# Patient Record
Sex: Female | Born: 1980 | Race: Black or African American | Hispanic: No | Marital: Single | Smoking: Current every day smoker
Health system: Southern US, Community
[De-identification: ages and names within clinical notes are randomized; demographics above are authoritative.]

## PROBLEM LIST (undated history)

## (undated) DIAGNOSIS — N83209 Unspecified ovarian cyst, unspecified side: Secondary | ICD-10-CM

## (undated) DIAGNOSIS — J302 Other seasonal allergic rhinitis: Secondary | ICD-10-CM

## (undated) DIAGNOSIS — Z21 Asymptomatic human immunodeficiency virus [HIV] infection status: Secondary | ICD-10-CM

## (undated) DIAGNOSIS — L0293 Carbuncle, unspecified: Secondary | ICD-10-CM

## (undated) DIAGNOSIS — L0292 Furuncle, unspecified: Secondary | ICD-10-CM

## (undated) DIAGNOSIS — R569 Unspecified convulsions: Secondary | ICD-10-CM

## (undated) DIAGNOSIS — R350 Frequency of micturition: Secondary | ICD-10-CM

## (undated) DIAGNOSIS — N1330 Unspecified hydronephrosis: Secondary | ICD-10-CM

## (undated) DIAGNOSIS — N12 Tubulo-interstitial nephritis, not specified as acute or chronic: Secondary | ICD-10-CM

## (undated) DIAGNOSIS — B20 Human immunodeficiency virus [HIV] disease: Secondary | ICD-10-CM

## (undated) DIAGNOSIS — A599 Trichomoniasis, unspecified: Secondary | ICD-10-CM

## (undated) HISTORY — DX: Other seasonal allergic rhinitis: J30.2

## (undated) HISTORY — DX: Unspecified convulsions: R56.9

---
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
## 2003-01-19 ENCOUNTER — Encounter: Admission: RE | Admit: 2003-01-19 | Discharge: 2003-01-19 | Payer: Self-pay | Admitting: Internal Medicine

## 2003-03-02 ENCOUNTER — Ambulatory Visit (HOSPITAL_COMMUNITY): Admission: RE | Admit: 2003-03-02 | Discharge: 2003-03-02 | Payer: Self-pay | Admitting: Internal Medicine

## 2003-03-02 ENCOUNTER — Encounter: Admission: RE | Admit: 2003-03-02 | Discharge: 2003-03-02 | Payer: Self-pay | Admitting: Internal Medicine

## 2003-03-30 ENCOUNTER — Encounter: Admission: RE | Admit: 2003-03-30 | Discharge: 2003-03-30 | Payer: Self-pay | Admitting: Internal Medicine

## 2003-06-16 ENCOUNTER — Ambulatory Visit (HOSPITAL_COMMUNITY): Admission: RE | Admit: 2003-06-16 | Discharge: 2003-06-16 | Payer: Self-pay | Admitting: Internal Medicine

## 2003-06-16 ENCOUNTER — Encounter: Admission: RE | Admit: 2003-06-16 | Discharge: 2003-06-16 | Payer: Self-pay | Admitting: Internal Medicine

## 2003-07-21 ENCOUNTER — Encounter: Admission: RE | Admit: 2003-07-21 | Discharge: 2003-07-21 | Payer: Self-pay | Admitting: Internal Medicine

## 2003-09-28 ENCOUNTER — Ambulatory Visit: Payer: Self-pay | Admitting: Internal Medicine

## 2003-09-28 ENCOUNTER — Ambulatory Visit (HOSPITAL_COMMUNITY): Admission: RE | Admit: 2003-09-28 | Discharge: 2003-09-28 | Payer: Self-pay | Admitting: Internal Medicine

## 2004-01-25 ENCOUNTER — Ambulatory Visit: Payer: Self-pay | Admitting: Internal Medicine

## 2004-01-25 ENCOUNTER — Ambulatory Visit (HOSPITAL_COMMUNITY): Admission: RE | Admit: 2004-01-25 | Discharge: 2004-01-25 | Payer: Self-pay | Admitting: Internal Medicine

## 2004-02-08 ENCOUNTER — Ambulatory Visit: Payer: Self-pay | Admitting: *Deleted

## 2004-02-24 ENCOUNTER — Ambulatory Visit: Payer: Self-pay | Admitting: Family Medicine

## 2004-03-02 ENCOUNTER — Ambulatory Visit (HOSPITAL_COMMUNITY): Admission: RE | Admit: 2004-03-02 | Discharge: 2004-03-02 | Payer: Self-pay | Admitting: *Deleted

## 2004-03-16 ENCOUNTER — Ambulatory Visit: Payer: Self-pay | Admitting: Family Medicine

## 2004-03-30 ENCOUNTER — Ambulatory Visit: Payer: Self-pay | Admitting: Family Medicine

## 2004-04-06 ENCOUNTER — Ambulatory Visit (HOSPITAL_COMMUNITY): Admission: RE | Admit: 2004-04-06 | Discharge: 2004-04-06 | Payer: Self-pay | Admitting: *Deleted

## 2004-04-13 ENCOUNTER — Ambulatory Visit: Payer: Self-pay | Admitting: Family Medicine

## 2004-04-18 ENCOUNTER — Ambulatory Visit: Payer: Self-pay | Admitting: Internal Medicine

## 2004-04-18 ENCOUNTER — Ambulatory Visit (HOSPITAL_COMMUNITY): Admission: RE | Admit: 2004-04-18 | Discharge: 2004-04-18 | Payer: Self-pay | Admitting: Internal Medicine

## 2004-04-18 ENCOUNTER — Encounter (INDEPENDENT_AMBULATORY_CARE_PROVIDER_SITE_OTHER): Payer: Self-pay | Admitting: *Deleted

## 2004-04-18 LAB — CONVERTED CEMR LAB: CD4 Count: 250 microliters

## 2004-05-04 ENCOUNTER — Ambulatory Visit: Payer: Self-pay | Admitting: Family Medicine

## 2004-05-25 ENCOUNTER — Ambulatory Visit: Payer: Self-pay | Admitting: *Deleted

## 2004-05-30 ENCOUNTER — Ambulatory Visit: Payer: Self-pay | Admitting: Internal Medicine

## 2004-06-08 ENCOUNTER — Ambulatory Visit: Payer: Self-pay | Admitting: Family Medicine

## 2004-06-22 ENCOUNTER — Ambulatory Visit: Payer: Self-pay | Admitting: Family Medicine

## 2004-07-06 ENCOUNTER — Ambulatory Visit: Payer: Self-pay | Admitting: Family Medicine

## 2004-08-01 ENCOUNTER — Ambulatory Visit: Payer: Self-pay | Admitting: Internal Medicine

## 2004-08-01 ENCOUNTER — Encounter (INDEPENDENT_AMBULATORY_CARE_PROVIDER_SITE_OTHER): Payer: Self-pay | Admitting: *Deleted

## 2004-08-01 ENCOUNTER — Ambulatory Visit (HOSPITAL_COMMUNITY): Admission: RE | Admit: 2004-08-01 | Discharge: 2004-08-01 | Payer: Self-pay | Admitting: Internal Medicine

## 2004-08-03 ENCOUNTER — Ambulatory Visit: Payer: Self-pay | Admitting: Family Medicine

## 2004-08-10 ENCOUNTER — Ambulatory Visit: Payer: Self-pay | Admitting: *Deleted

## 2004-08-15 ENCOUNTER — Encounter (INDEPENDENT_AMBULATORY_CARE_PROVIDER_SITE_OTHER): Payer: Self-pay | Admitting: *Deleted

## 2004-08-15 ENCOUNTER — Ambulatory Visit: Payer: Self-pay | Admitting: Internal Medicine

## 2004-08-15 LAB — CONVERTED CEMR LAB: HIV 1 RNA Quant: 5750 copies/mL

## 2004-08-21 ENCOUNTER — Ambulatory Visit: Payer: Self-pay | Admitting: Internal Medicine

## 2004-08-21 ENCOUNTER — Ambulatory Visit: Payer: Self-pay | Admitting: Family Medicine

## 2004-08-21 ENCOUNTER — Inpatient Hospital Stay (HOSPITAL_COMMUNITY): Admission: AD | Admit: 2004-08-21 | Discharge: 2004-09-05 | Payer: Self-pay | Admitting: Obstetrics and Gynecology

## 2004-09-02 ENCOUNTER — Encounter (INDEPENDENT_AMBULATORY_CARE_PROVIDER_SITE_OTHER): Payer: Self-pay | Admitting: Specialist

## 2004-09-19 ENCOUNTER — Ambulatory Visit: Payer: Self-pay | Admitting: Internal Medicine

## 2006-01-30 DIAGNOSIS — G3184 Mild cognitive impairment, so stated: Secondary | ICD-10-CM | POA: Insufficient documentation

## 2006-01-30 DIAGNOSIS — F17200 Nicotine dependence, unspecified, uncomplicated: Secondary | ICD-10-CM

## 2006-01-30 DIAGNOSIS — B20 Human immunodeficiency virus [HIV] disease: Secondary | ICD-10-CM | POA: Insufficient documentation

## 2006-01-30 DIAGNOSIS — J309 Allergic rhinitis, unspecified: Secondary | ICD-10-CM | POA: Insufficient documentation

## 2006-01-30 DIAGNOSIS — F329 Major depressive disorder, single episode, unspecified: Secondary | ICD-10-CM | POA: Insufficient documentation

## 2006-01-30 DIAGNOSIS — N3944 Nocturnal enuresis: Secondary | ICD-10-CM | POA: Insufficient documentation

## 2006-01-30 DIAGNOSIS — F411 Generalized anxiety disorder: Secondary | ICD-10-CM | POA: Insufficient documentation

## 2006-02-25 ENCOUNTER — Encounter (INDEPENDENT_AMBULATORY_CARE_PROVIDER_SITE_OTHER): Payer: Self-pay | Admitting: *Deleted

## 2006-02-25 LAB — CONVERTED CEMR LAB

## 2006-03-10 ENCOUNTER — Encounter (INDEPENDENT_AMBULATORY_CARE_PROVIDER_SITE_OTHER): Payer: Self-pay | Admitting: *Deleted

## 2006-04-03 ENCOUNTER — Encounter: Payer: Self-pay | Admitting: Internal Medicine

## 2006-04-23 ENCOUNTER — Encounter: Admission: RE | Admit: 2006-04-23 | Discharge: 2006-04-23 | Payer: Self-pay | Admitting: Internal Medicine

## 2006-04-23 ENCOUNTER — Ambulatory Visit: Payer: Self-pay | Admitting: Internal Medicine

## 2006-04-23 LAB — CONVERTED CEMR LAB
ALT: 20 units/L (ref 0–35)
AST: 31 units/L (ref 0–37)
BUN: 11 mg/dL (ref 6–23)
Basophils Absolute: 0 10*3/uL (ref 0.0–0.1)
Basophils Relative: 1 % (ref 0–1)
CO2: 25 meq/L (ref 19–32)
Chloride: 107 meq/L (ref 96–112)
Creatinine, Ser: 0.52 mg/dL (ref 0.40–1.20)
Eosinophils Absolute: 0 10*3/uL (ref 0.0–0.7)
Eosinophils Relative: 2 % (ref 0–5)
Glucose, Bld: 82 mg/dL (ref 70–99)
Hemoglobin: 11.9 g/dL — ABNORMAL LOW (ref 12.0–15.0)
Lymphs Abs: 0.9 10*3/uL (ref 0.7–3.3)
MCHC: 33.5 g/dL (ref 30.0–36.0)
Neutrophils Relative %: 48 % (ref 43–77)
RBC: 3.83 M/uL — ABNORMAL LOW (ref 3.87–5.11)
RDW: 12.7 % (ref 11.5–14.0)
Sodium: 138 meq/L (ref 135–145)
Total Bilirubin: 0.2 mg/dL — ABNORMAL LOW (ref 0.3–1.2)
WBC: 2.4 10*3/uL — ABNORMAL LOW (ref 4.0–10.5)

## 2006-04-29 ENCOUNTER — Telehealth: Payer: Self-pay | Admitting: Internal Medicine

## 2006-05-07 ENCOUNTER — Telehealth: Payer: Self-pay | Admitting: Internal Medicine

## 2006-05-15 ENCOUNTER — Telehealth: Payer: Self-pay | Admitting: Internal Medicine

## 2006-06-11 ENCOUNTER — Ambulatory Visit: Payer: Self-pay | Admitting: Internal Medicine

## 2006-06-11 DIAGNOSIS — N3000 Acute cystitis without hematuria: Secondary | ICD-10-CM

## 2008-01-07 ENCOUNTER — Encounter: Payer: Self-pay | Admitting: Internal Medicine

## 2008-01-12 ENCOUNTER — Encounter: Payer: Self-pay | Admitting: Internal Medicine

## 2008-01-16 ENCOUNTER — Ambulatory Visit: Payer: Self-pay | Admitting: Internal Medicine

## 2008-01-16 ENCOUNTER — Encounter: Payer: Self-pay | Admitting: Internal Medicine

## 2008-01-16 LAB — CONVERTED CEMR LAB
AST: 28 units/L (ref 0–37)
Basophils Absolute: 0 10*3/uL (ref 0.0–0.1)
Basophils Relative: 0 % (ref 0–1)
Chloride: 103 meq/L (ref 96–112)
Creatinine, Ser: 0.44 mg/dL (ref 0.40–1.20)
Eosinophils Absolute: 0 10*3/uL (ref 0.0–0.7)
Eosinophils Relative: 0 % (ref 0–5)
HIV 1 RNA Quant: 16100 copies/mL — ABNORMAL HIGH (ref <48)
Lymphocytes Relative: 31 % (ref 12–46)
Lymphs Abs: 0.9 10*3/uL (ref 0.7–4.0)
Monocytes Absolute: 0.2 10*3/uL (ref 0.1–1.0)
Monocytes Relative: 7 % (ref 3–12)
Neutro Abs: 1.7 10*3/uL (ref 1.7–7.7)
Neutrophils Relative %: 61 % (ref 43–77)
Platelets: 193 10*3/uL (ref 150–400)
RBC: 3.91 M/uL (ref 3.87–5.11)
RDW: 14.1 % (ref 11.5–15.5)
WBC: 2.8 10*3/uL — ABNORMAL LOW (ref 4.0–10.5)

## 2008-01-21 ENCOUNTER — Encounter: Payer: Self-pay | Admitting: Internal Medicine

## 2008-01-28 ENCOUNTER — Ambulatory Visit: Payer: Self-pay | Admitting: Internal Medicine

## 2008-01-28 DIAGNOSIS — R350 Frequency of micturition: Secondary | ICD-10-CM

## 2008-01-28 DIAGNOSIS — R1013 Epigastric pain: Secondary | ICD-10-CM

## 2008-01-28 LAB — CONVERTED CEMR LAB
Bilirubin Urine: NEGATIVE
Glucose, Urine, Semiquant: NEGATIVE
Nitrite: POSITIVE
Protein, U semiquant: >=300
Specific Gravity, Urine: 1.02

## 2008-01-29 ENCOUNTER — Encounter: Payer: Self-pay | Admitting: Internal Medicine

## 2008-02-12 ENCOUNTER — Ambulatory Visit: Payer: Self-pay | Admitting: Internal Medicine

## 2008-02-12 DIAGNOSIS — L0293 Carbuncle, unspecified: Secondary | ICD-10-CM

## 2008-02-12 DIAGNOSIS — L0292 Furuncle, unspecified: Secondary | ICD-10-CM | POA: Insufficient documentation

## 2008-03-09 ENCOUNTER — Ambulatory Visit: Payer: Self-pay | Admitting: Internal Medicine

## 2008-11-18 ENCOUNTER — Telehealth (INDEPENDENT_AMBULATORY_CARE_PROVIDER_SITE_OTHER): Payer: Self-pay | Admitting: *Deleted

## 2009-01-03 ENCOUNTER — Ambulatory Visit: Payer: Self-pay | Admitting: Internal Medicine

## 2009-01-03 LAB — CONVERTED CEMR LAB
Albumin: 3.7 g/dL (ref 3.5–5.2)
Alkaline Phosphatase: 66 units/L (ref 39–117)
BUN: 8 mg/dL (ref 6–23)
CO2: 22 meq/L (ref 19–32)
Eosinophils Relative: 3 % (ref 0–5)
Glucose, Bld: 91 mg/dL (ref 70–99)
HCT: 37.6 % (ref 36.0–46.0)
HDL: 39 mg/dL — ABNORMAL LOW (ref >39)
HIV 1 RNA Quant: <48 copies/mL (ref <48)
Lymphocytes Relative: 30 % (ref 12–46)
Lymphs Abs: 0.9 10*3/uL (ref 0.7–4.0)
MCV: 93.3 fL (ref >=78.0)
Monocytes Absolute: 0.2 10*3/uL (ref 0.1–1.0)
Neutro Abs: 1.7 10*3/uL (ref 1.7–7.7)
Platelets: 224 10*3/uL (ref 150–400)
RBC: 4.03 M/uL (ref 3.87–5.11)
Sodium: 138 meq/L (ref 135–145)
Total Bilirubin: 0.3 mg/dL (ref 0.3–1.2)
VLDL: 19 mg/dL (ref 0–40)

## 2009-01-14 ENCOUNTER — Telehealth (INDEPENDENT_AMBULATORY_CARE_PROVIDER_SITE_OTHER): Payer: Self-pay | Admitting: *Deleted

## 2009-01-26 ENCOUNTER — Ambulatory Visit: Payer: Self-pay | Admitting: Internal Medicine

## 2009-02-11 ENCOUNTER — Telehealth (INDEPENDENT_AMBULATORY_CARE_PROVIDER_SITE_OTHER): Payer: Self-pay | Admitting: *Deleted

## 2009-04-29 ENCOUNTER — Encounter (INDEPENDENT_AMBULATORY_CARE_PROVIDER_SITE_OTHER): Payer: Self-pay | Admitting: *Deleted

## 2009-07-06 ENCOUNTER — Encounter: Payer: Self-pay | Admitting: Internal Medicine

## 2009-07-08 ENCOUNTER — Ambulatory Visit: Payer: Self-pay | Admitting: Internal Medicine

## 2009-07-08 LAB — CONVERTED CEMR LAB: HIV-1 RNA Quant, Log: 3.14 — ABNORMAL HIGH (ref <1.68)

## 2009-08-23 ENCOUNTER — Encounter: Payer: Self-pay | Admitting: Internal Medicine

## 2009-08-25 ENCOUNTER — Telehealth (INDEPENDENT_AMBULATORY_CARE_PROVIDER_SITE_OTHER): Payer: Self-pay | Admitting: *Deleted

## 2009-11-14 ENCOUNTER — Encounter: Payer: Self-pay | Admitting: Internal Medicine

## 2009-11-15 ENCOUNTER — Encounter (INDEPENDENT_AMBULATORY_CARE_PROVIDER_SITE_OTHER): Payer: Self-pay | Admitting: *Deleted

## 2010-02-02 NOTE — Miscellaneous (Signed)
Summary: Orders Update - [facility] referral   Clinical Lists Changes  Problems: Reactivated problem of SCREENING FOR MALIGNANT NEOPLASM OF THE CERVIX (ICD-V76.2) Orders: Added new Referral order of Misc. Referral (Misc. Ref) - Signed

## 2010-02-02 NOTE — Consult Note (Signed)
Summary: [facility]: ED  [facility]: ED   Imported By: Florinda Marker 11/30/2009 10:26:44  _____________________________________________________________________  External Attachment:    Type:   Image     Comment:   External Document

## 2010-02-02 NOTE — Progress Notes (Signed)
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 11/16/08-[facility] scheduled ID medical appt for client 11/19/08-[facility] Counselor visited with client @ her home (home visit) 01/26/09-[facility] Counselor transported client to ID medical appt Ferd Glassing  February 11, 2009 2:16 PM

## 2010-02-02 NOTE — Progress Notes (Signed)
Summary: [facility] contact  Phone Note Call from Patient   Summary of Call: patient connected with [facility] services 12/10.

## 2010-02-02 NOTE — Miscellaneous (Signed)
Summary: [employer].  [place]  Recruitment consultant.   Imported By: Florinda Marker 08/25/2009 15:21:39  _____________________________________________________________________  External Attachment:    Type:   Image     Comment:   External Document

## 2010-02-02 NOTE — Miscellaneous (Signed)
  Clinical Lists Changes  Observations: Added new observation of YEARAIDSPOS: 2003  (11/15/2009 15:07)

## 2010-02-02 NOTE — Miscellaneous (Signed)
Summary: Orders Update - labs  Clinical Lists Changes  Orders: Added new Test order of T-CD4SP [facility]) (CD4SP) - Signed Added new Test order of T-HIV Viral Load [phone]) - Signed     Process Orders Check Orders Results:     [facility]: ABN not required for this insurance Order queued for requisitioning for [facility]: July 06, 2009 11:08 AM  Tests Sent for requisitioning (July 06, 2009 11:08 AM):     07/08/2009: [facility] -- T-HIV Viral Load [phone] (signed)

## 2010-02-02 NOTE — Assessment & Plan Note (Signed)
Summary: resfrom 1/17   CC:  follow-up visit and Depression.  History of Present Illness: Kara Allen is in for her first visit since March of last year. She says she did not realize it had been that long.  Because of missed visits her bridging the case manager contacted her and brought her in today.  She does not know the names of her medications but can take them all off on the medication list confirming she is on Epzicom, Reyataz and Norvir. She also takes dapsone.  She says that she uses a pill box and feels that every Monday.  She takes dapsone at night and her antiretrovirals in the morning.  She says that she does not have any problems tolerating them other than slight difficulty swallowing them.  When asked how many times in the last week she has missed her medications she first asked how many days there are no week and then said probably once a week.  Depression History:      The patient denies a depressed mood most of the day and a diminished interest in her usual daily activities.        Flu Vaccine Consent Questions     Do you have a history of severe allergic reactions to this vaccine? no    Any prior history of allergic reactions to egg and/or gelatin? no    Do you have a sensitivity to the preservative Thimersol? no    Do you have a past history of Guillan-Barre Syndrome? no    Do you currently have an acute febrile illness? no    Have you ever had a severe reaction to latex? no    Vaccine information given and explained to patient? yes    Are you currently pregnant? no    Lot [id number]   Exp Date:03/31/2009   Manufacturer: [employer]    Site Given  Left Deltoid IM Jennet Maduro RN  January 26, 2009 3:02 PM    Preventive Screening-Counseling & Management  Alcohol-Tobacco     Alcohol type: rarely     Smoking Status: current     Smoking Cessation Counseling: yes     Packs/Day: 1/3     Cans of tobacco/week: no     Passive Smoke Exposure: yes  Caffeine-Diet-Exercise    Caffeine use/day: 0     Does Patient Exercise: yes     Type of exercise: walking     Exercise (avg: min/session): 5 minutes     Times/week: 5  Hep-HIV-STD-Contraception     HIV Risk: no risk noted  Safety-Violence-Falls     Seat Belt Use: yes  Comments: condoms declined, buying their own due to Latex allergies      Sexual History:  currently monogamous.        Drug Use:  never.     Prior Medication List:  EPZICOM 600-300 MG TABS (ABACAVIR SULFATE-LAMIVUDINE) Take 1 tablet by mouth once a day REYATAZ 300 MG CAPS (ATAZANAVIR SULFATE) Take 1 capsule by mouth once a day NORVIR 100 MG CAPS (RITONAVIR) Take 1 capsule by mouth once a day DAPSONE 100 MG TABS (DAPSONE) Take 1 tablet by mouth once a day   Current Allergies (reviewed today): ! SEPTRA ! * LATEX Vital Signs:  Patient profile:   30 year old female Menstrual status:  regular LMP:     01/03/2009 Height:      61 inches (154.94 cm) Weight:      160.7 pounds (73.05 kg) BMI:     30.47 Temp:  97.6 degrees F (36.44 degrees C) oral Pulse rate:   87 / minute BP sitting:   106 / 76  (left arm) Cuff size:   regular  Vitals Entered By: Jennet Maduro RN (January 26, 2009 2:48 PM) CC: follow-up visit, Depression Is Patient Diabetic? No Pain Assessment Patient in pain? no      Nutritional Status BMI of 25 - 29 = overweight Nutritional Status Detail appetite "good"  Have you ever been in a relationship where you felt threatened, hurt or afraid?not asked son present   Does patient need assistance? Functional Status Self care Ambulation Normal Comments misses 1 dose a week LMP (date): 01/03/2009 LMP - Character: normal     Enter LMP: 01/03/2009 Last PAP Result NEGATIVE FOR INTRAEPITHELIAL LESIONS OR MALIGNANCY.    Physical Exam  General:  alert and overweight-appearing.   Mouth:  good dentition, pharynx pink and moist, no erythema, and no exudates.   Lungs:  normal breath sounds.  no crackles and no  wheezes.   Heart:  normal rate, regular rhythm, and no murmur.   Abdomen:  soft, non-tender, and normal bowel sounds.   Skin:  no rashes.   Psych:  normally interactive, good eye contact, not anxious appearing, and not depressed appearing.     Impression & Recommendations:  Problem # 1:  HIV DISEASE (ICD-042) Ffortunately her viral load is undetectable but she continues to struggle with adherence, missing clinic visits and medications frequently.  I suggested that she said her cell phone alarm for 9 o'clock every morning and take all 4 of her medications at the time.  I've asked her to contact her case manager for me the next time she misses. I will have her come back in one month with all of her medications and her pillbox. 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 Patient Level III (29562)  Patient Instructions: 1)  Please schedule a follow-up appointment in 1 month.

## 2010-02-02 NOTE — Progress Notes (Signed)
Summary: Bil. LE pain increasingly a prob., birth control, discuss @ OV  Phone Note Outgoing Call   Call placed by: Jennet Maduro RN,  August 25, 2009 2:25 PM Call placed to: Patient Action Taken: Phone Call Completed Summary of Call: RN spoke to pt. re:  application for downstairs apt.  Pt. stated that she has been having problems with bilateral lower extremity pain for the past 2-3 years.  After being on her feet for 1-2 hours her legs become severely painful to the point where she is unable to walk her son to his school bus stop.  Her son missed school some days last year due to inability to walk without pain.  Pt. also mentioned that she wants to talk with Dr. Orvan Falconer about birth control.  Pt. stated that she would discuss these issues with Dr. Orvan Falconer when she comes for appt. on 09/06/09.  Jennet Maduro RN  August 25, 2009 2:28 PM

## 2010-02-02 NOTE — Miscellaneous (Signed)
Summary: Orders Update - labs  Clinical Lists Changes  Problems: Added new problem of ENCOUNTER FOR LONG-TERM USE OF OTHER MEDICATIONS (ICD-V58.69) Orders: Added new Test order of T-CBC w/Diff [phone]) - Signed Added new Test order of T-CD4SP [facility]) (CD4SP) - Signed Added new Test order of T-RPR (Syphilis) [phone]) - Signed Added new Test order of T-Lipid Profile [phone]) - Signed Added new Test order of T-HIV Viral Load [phone]) - Signed Added new Test order of T-Comprehensive Metabolic Panel [phone]) - Signed Added new Test order of T-Lipid Profile ([id number]) - Signed Added new Test order of T-Comprehensive Metabolic Panel ([id number]) - Signed Added new Test order of T-HIV Viral Load ([phone]) - Signed     Process Orders Check Orders Results:     [facility]: ABN not required for this insurance Tests Sent for requisitioning (January 03, 2009 3:15 PM):     01/03/2009: [facility] -- T-CBC w/Diff [[id number]] (signed)     01/03/2009: [facility] -- T-RPR (Syphilis) [phone] (signed)     01/03/2009: [facility] -- T-Lipid Profile [phone] (signed)     01/03/2009: [facility] -- T-Comprehensive Metabolic Panel [[id number]] (signed)     01/03/2009: [facility] -- T-HIV Viral Load [phone] (signed)

## 2010-02-02 NOTE — Assessment & Plan Note (Signed)
Summary: pap smear appt    Vital Signs:  Patient profile:   30 year old female Menstrual status:  regular LMP:     06/30/2009  Vitals Entered By: Jennet Maduro RN (July 08, 2009 10:08 AM)  Patient Instructions: 1)  Your results will be ready in about a week.  If everything is normal I will send you a letter in the mail.  If your doctor has any information for you I will contact you my phone.   2)  Thank you for coming for your PAP smear appt. 3)  Denise    CC:  PAP smear visit.  Pt. given condoms.  Pt. given educational materials re:  HIV and women, PAP smears, BSE, nutritions, OIs, diet, and exercise and self-esteem..  CC: PAP smear visit.  Pt. given condoms.  Pt. given educational materials re:  HIV and women, PAP smears, BSE, nutritions, OIs, diet, exercise and self-esteem. LMP (date): 06/30/2009 LMP - Character: normal     Menstrual Status regular Enter LMP: 06/30/2009 Last PAP Result NEGATIVE FOR INTRAEPITHELIAL LESIONS OR MALIGNANCY.  Prior Medications: EPZICOM 600-300 MG TABS (ABACAVIR SULFATE-LAMIVUDINE) Take 1 tablet by mouth once a day REYATAZ 300 MG CAPS (ATAZANAVIR SULFATE) Take 1 capsule by mouth once a day NORVIR 100 MG CAPS (RITONAVIR) Take 1 capsule by mouth once a day DAPSONE 100 MG TABS (DAPSONE) Take 1 tablet by mouth once a day Current Allergies: ! SEPTRA ! * LATEX Orders Added: 1)  T-PAP [facility]) [88142] 2)  Est. Patient Level I [16109]

## 2010-02-02 NOTE — Miscellaneous (Signed)
Summary: HIPAA Restrictions  HIPAA Restrictions   Imported By: Florinda Marker 01/03/2009 15:13:32  _____________________________________________________________________  External Attachment:    Type:   Image     Comment:   External Document

## 2010-02-27 ENCOUNTER — Encounter: Payer: Self-pay | Admitting: Internal Medicine

## 2010-03-09 NOTE — Miscellaneous (Signed)
Summary: [facility]  Clinical Lists Changes  Orders: Added new Referral order of Misc. Referral (Misc. Ref) - Signed 

## 2010-03-19 LAB — T-HELPER CELL (CD4) - (RCID CLINIC ONLY)
CD4 % Helper T Cell: 26 % — ABNORMAL LOW (ref 33–55)
CD4 T Cell Abs: 190 uL — ABNORMAL LOW (ref 400–2700)

## 2010-04-17 LAB — T-HELPER CELL (CD4) - (RCID CLINIC ONLY): CD4 T Cell Abs: 180 uL — ABNORMAL LOW (ref 400–2700)

## 2010-05-19 NOTE — Discharge Summary (Signed)
Kara Allen, Kara Allen             ACCOUNT NO.:  [id number]   MEDICAL RECORD NO.:  [id number]          PATIENT TYPE:  INP   LOCATION:  9103                          FACILITY:  [facility]   PHYSICIAN:  Benn Moulder, M.D.      DATE OF BIRTH:  09-14-1980   DATE OF ADMISSION:  08/21/2004  DATE OF DISCHARGE:  09/05/2004                                 DISCHARGE SUMMARY   ADMISSION DIAGNOSES:  1.  Intrauterine pregnancy at 37-5/7 weeks.  2.  Human immunodeficiency virus.   DISCHARGE DIAGNOSES:  1.  Status post low transverse cesarean section.  2.  Human immunodeficiency virus.  3.  8 pound 12 ounce healthy infant female.   DISCHARGE MEDICATIONS:  1.  Trizivir 1 p.o. b.i.d.  2.  Viread 300 mg p.o. daily.  3.  Kaletra 2 p.o. b.i.d.  4.  Prenatal vitamin 1 p.o. b.i.d.  5.  Percocet 1 p.o. q.4-6 h. p.r.n. pain.   ADMISSION HISTORY:  A 30 year old G2 P0-0-1-0 with HIV at 37-5/7 weeks  presented for inpatient management of HIV and pregnancy.   HOSPITAL COURSE:  1.  OB.  The patient was admitted to manage the HIV and pregnancy.  The      patient's viral load was decreased to 442 on August 27, 2004, so at that      point vaginal delivery was indicated.  The patient failed induction with      Cytotec and Pitocin.  Fetal heart tones did remain reassuring.  On      September 02, 2004, the patient decided that she would like a C-section.      C-section was performed on September 02, 2004 without complications.  An      8 pound 12 ounce female was delivered, with Apgars of 8 and 9.  The      patient's blood type is A positive.  She was rubella not immune.  The      patient is bottle feeding and would like a BTL, which she will have in      about 30 days.  2.  HIV.  ID was consulted and followed the patient throughout the hospital      stay.  The patient's viral load was 6,310 on admission.  She was started      on Viread 300 mg p.o. daily, which decreased her viral load to 442 on      August 27, 2004.  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  The patient's medications during the      hospital stay included Kaletra 3 mg p.o. b.i.d.; as well as the Viread      300 mg p.o. daily.  Her Combivir was changed to Trizivir 1 p.o. b.i.d.      The patient was also given AZT prior to delivery.   DISCHARGE CONDITION:  Stable.   PATIENT INSTRUCTIONS:  1.  The patient is to follow up with her ID physician.  She will call to      make an appointment with  her ID physician.  2.  The patient was advised not to have sex for 6 weeks and to abstain from      heavy lifting for 4 weeks.  3.  The patient is to follow up at [facility] in 6 weeks.      Benn Moulder, M.D.     MR/MEDQ  D:  09/05/2004  T:  09/05/2004  Job:  [id number]

## 2010-05-19 NOTE — Op Note (Signed)
Kara Allen, EUGENE NO.:  [id number]   MEDICAL RECORD NO.:  [id number]          PATIENT TYPE:  INP   LOCATION:                                FACILITY:  [facility]   PHYSICIAN:  Conni Elliot, M.D.DATE OF BIRTH:  08-Jul-1980   DATE OF PROCEDURE:  09/02/2004  DATE OF DISCHARGE:                                 OPERATIVE REPORT   PREOPERATIVE DIAGNOSES:  1.  Uterine growth restriction.  2.  Oligohydramnios.  3.  Failed induction.  4.  Human immunodeficiency virus.   POSTOPERATIVE DIAGNOSES:  1.  Uterine growth restriction.  2.  Oligohydramnios.  3.  Failed induction.  4.  Human immunodeficiency virus.   OPERATION/PROCEDURE:  Low transverse cesarean section.   SURGEON:  Conni Elliot, M.D.   ANESTHESIA:  Spinal.   SURGEON:  Conni Elliot, M.D.   OPERATION/PROCEDURE:  Low transverse cesarean section and repair of vaginal  tear.   ANESTHESIA:  Continuous lumbar epidural.   OPERATIVE FINDINGS:  An 8 pound 12 ounce female delivered at 2331.  Apgars  were 8 and 9.  Cord pH was 7.26.   DESCRIPTION OF PROCEDURE:  After administration of spinal anesthesia, the  patient supine with __________ position and was prepped and draped in the  sterile fashion.   A low transverse Pfannenstiel incision was made in the skin carried down to  the fascia.  Rectus muscle separated, __________.  Bladder flap created.  Low transverse uterine incision was made.  Baby was delivered from the  vertex position without difficulty.  Cord was doubly clamped and cut.  The  baby was handed to the neonatologist in attendance.  It was noted at this  time that the umbilical cord was hyperspiraled proximal to the portion of  the cord for about a third of it from the third closest to the baby.  The  remainder of the cord appeared normal.  So was the  pathology.  The uterus, bladder flap, anterior peritoneum fascia was  __________.  Estimated blood loss was less than 800 mL.  It should  be noted  also that the right ovary could not be found.  However, there was normal  fallopian tube.           ______________________________  Conni Elliot, M.D.     ASG/MEDQ  D:  09/02/2004  T:  09/03/2004  Job:  [id number]

## 2013-05-11 ENCOUNTER — Encounter: Payer: Self-pay | Admitting: Internal Medicine

## 2013-06-19 ENCOUNTER — Telehealth: Payer: Self-pay | Admitting: *Deleted

## 2013-06-19 NOTE — Telephone Encounter (Signed)
[facility]  Unable to reach by phone.  Will send [facility] referral

## 2013-08-13 ENCOUNTER — Other Ambulatory Visit (HOSPITAL_COMMUNITY)
Admission: RE | Admit: 2013-08-13 | Discharge: 2013-08-13 | Disposition: A | Discharge status: Home / Self Care | Payer: Medicaid Other | Source: Ambulatory Visit | Attending: Internal Medicine | Admitting: Internal Medicine

## 2013-08-13 ENCOUNTER — Ambulatory Visit (INDEPENDENT_AMBULATORY_CARE_PROVIDER_SITE_OTHER): Payer: Medicaid Other

## 2013-08-13 ENCOUNTER — Telehealth: Payer: Self-pay | Admitting: *Deleted

## 2013-08-13 DIAGNOSIS — R309 Painful micturition, unspecified: Secondary | ICD-10-CM

## 2013-08-13 DIAGNOSIS — Z113 Encounter for screening for infections with a predominantly sexual mode of transmission: Secondary | ICD-10-CM | POA: Insufficient documentation

## 2013-08-13 DIAGNOSIS — B20 Human immunodeficiency virus [HIV] disease: Secondary | ICD-10-CM

## 2013-08-13 LAB — COMPLETE METABOLIC PANEL WITH GFR
ALBUMIN: 3 g/dL — AB (ref 3.5–5.2)
ALT: 9 U/L (ref 0–35)
AST: 30 U/L (ref 0–37)
Alkaline Phosphatase: 69 U/L (ref 39–117)
BILIRUBIN TOTAL: 0.3 mg/dL (ref 0.2–1.2)
BUN: 7 mg/dL (ref 6–23)
CALCIUM: 8.9 mg/dL (ref 8.4–10.5)
CHLORIDE: 98 meq/L (ref 96–112)
CO2: 26 meq/L (ref 19–32)
Creat: 0.54 mg/dL (ref 0.50–1.10)
GFR, Est African American: >89 mL/min
Glucose, Bld: 74 mg/dL (ref 70–99)
POTASSIUM: 3.3 meq/L — AB (ref 3.5–5.3)
SODIUM: 133 meq/L — AB (ref 135–145)
Total Protein: 10.1 g/dL — ABNORMAL HIGH (ref 6.0–8.3)

## 2013-08-13 LAB — CBC WITH DIFFERENTIAL/PLATELET
BASOS PCT: 0 % (ref 0–1)
Basophils Absolute: 0 10*3/uL (ref 0.0–0.1)
EOS PCT: 0 % (ref 0–5)
Eosinophils Absolute: 0 10*3/uL (ref 0.0–0.7)
HEMATOCRIT: 32.4 % — AB (ref 36.0–46.0)
Hemoglobin: 10.9 g/dL — ABNORMAL LOW (ref 12.0–15.0)
Lymphocytes Relative: 29 % (ref 12–46)
Lymphs Abs: 1.2 10*3/uL (ref 0.7–4.0)
MCH: 29.6 pg (ref 26.0–34.0)
MCHC: 33.6 g/dL (ref 30.0–36.0)
MCV: 88 fL (ref 78.0–100.0)
MONOS PCT: 8 % (ref 3–12)
Monocytes Absolute: 0.3 10*3/uL (ref 0.1–1.0)
NEUTROS ABS: 2.6 10*3/uL (ref 1.7–7.7)
NEUTROS PCT: 63 % (ref 43–77)
PLATELETS: 333 10*3/uL (ref 150–400)
RBC: 3.68 MIL/uL — ABNORMAL LOW (ref 3.87–5.11)
RDW: 15.2 % (ref 11.5–15.5)
WBC: 4.2 10*3/uL (ref 4.0–10.5)

## 2013-08-13 LAB — LIPID PANEL
CHOL/HDL RATIO: 3.2 ratio
Cholesterol: 99 mg/dL (ref 0–200)
HDL: 31 mg/dL — ABNORMAL LOW (ref >39)
LDL CALC: 44 mg/dL (ref 0–99)
Triglycerides: 121 mg/dL (ref <150)
VLDL: 24 mg/dL (ref 0–40)

## 2013-08-13 LAB — HEPATITIS B SURFACE ANTIBODY,QUALITATIVE: HEP B S AB: NEGATIVE

## 2013-08-13 LAB — HEPATITIS B CORE ANTIBODY, TOTAL: HEP B C TOTAL AB: NONREACTIVE

## 2013-08-13 LAB — RPR

## 2013-08-13 LAB — HEPATITIS A ANTIBODY, TOTAL: HEP A TOTAL AB: NONREACTIVE

## 2013-08-13 LAB — HEPATITIS B SURFACE ANTIGEN: HEP B S AG: NEGATIVE

## 2013-08-13 LAB — HEPATITIS C ANTIBODY: HCV Ab: NEGATIVE

## 2013-08-13 NOTE — Progress Notes (Addendum)
Patient seen today for intake after being out of care for 5+ years, she was a patient of Dr. Blair Dolphinampbell's. She has several concerns today about urinary pain, and loss of appetite. She states that she has been taking Emtriva, Norvir, and Reyataz, I asked her how she has been getting her medications refilled and she stated through the ED. She admits to missing doses more than 20 times. I observed noticeable white patches on her tongue during the intake. She states that she was infected at birth and her birth Mother is still alive and taking medications, not sure about her Father. Patient has a 64nine year old son that is negative. Patient was informed about HIV and understood the meaning of CD4 and Viral Load. Condoms given today. Will return in 2 weeks to follow up on lab work.

## 2013-08-13 NOTE — Telephone Encounter (Signed)
"  ALERT" value, Total Protein = 10.1.  Will notify Dr. Luciana Axeomer.  Pt returning to care, last OV 02/2010.  Appt 08/27/13 w/ Dr. Luciana Axeomer.

## 2013-08-14 ENCOUNTER — Other Ambulatory Visit: Payer: Self-pay | Admitting: Internal Medicine

## 2013-08-14 ENCOUNTER — Other Ambulatory Visit: Payer: Self-pay | Admitting: *Deleted

## 2013-08-14 DIAGNOSIS — N390 Urinary tract infection, site not specified: Secondary | ICD-10-CM

## 2013-08-14 LAB — URINALYSIS
Bilirubin Urine: NEGATIVE
Glucose, UA: NEGATIVE mg/dL
KETONES UR: NEGATIVE mg/dL
NITRITE: NEGATIVE
PROTEIN: 100 mg/dL — AB
Specific Gravity, Urine: 1.014 (ref 1.005–1.030)
Urobilinogen, UA: 0.2 mg/dL (ref 0.0–1.0)
pH: 8 (ref 5.0–8.0)

## 2013-08-14 LAB — URINE CULTURE

## 2013-08-14 LAB — T-HELPER CELL (CD4) - (RCID CLINIC ONLY)
CD4 % Helper T Cell: 7 % — ABNORMAL LOW (ref 33–55)
CD4 T CELL ABS: 90 /uL — AB (ref 400–2700)

## 2013-08-14 MED ORDER — NITROFURANTOIN 25 MG/5ML PO SUSP: 100.0000 mg | Freq: Two times a day (BID) | ORAL | Status: DC

## 2013-08-14 MED ORDER — NITROFURANTOIN 25 MG/5ML PO SUSP
100.0000 mg | Freq: Two times a day (BID) | ORAL | Status: AC
Start: 2013-08-14 — End: 2013-08-17

## 2013-08-14 NOTE — Progress Notes (Signed)
Patient notified that liquid antibiotic, macrobid has been sent to her pharmacy, [facility]. Patient had stated she could not swallow pills. Wendall MolaJacqueline Cockerham

## 2013-08-17 ENCOUNTER — Telehealth: Payer: Self-pay

## 2013-08-17 LAB — HIV-1 RNA ULTRAQUANT REFLEX TO GENTYP+
HIV 1 RNA QUANT: 37969 {copies}/mL — AB (ref <20)
HIV-1 RNA Quant, Log: 4.58 {Log} — ABNORMAL HIGH (ref <1.30)

## 2013-08-17 NOTE — Telephone Encounter (Signed)
Patient notified of abnormal labs.  Her Cd-4 is 90 and we would like to schedule and earlier vivist with the physician.  She is currently out of town until Friday and can only come for office visits on Thursdays.  I explained the importance of the earlier appointment as it relates to her HIV. Patient was urged to make accomodation for earlier appointment but she states it is not possible.  As for now she will keep appointment with Dr Luciana Axeomer for 08-27-13.  I offered assistance with transportation if this was an issue upon her return to town.   At the end of conversation patient admitted to not taking her medications because she can't swallow pills.  She only wants to take liquid medications or injections.   Laurell Josephsammy K Zelma Snead, RN

## 2013-08-19 LAB — HLA B*5701: HLA-B*5701 w/rflx HLA-B High: NEGATIVE

## 2013-08-20 ENCOUNTER — Ambulatory Visit: Payer: Medicaid Other

## 2013-08-27 ENCOUNTER — Ambulatory Visit (INDEPENDENT_AMBULATORY_CARE_PROVIDER_SITE_OTHER): Payer: Medicaid Other | Admitting: Internal Medicine

## 2013-08-27 VITALS — BP 104/74 | HR 97 | Temp 99.5°F | Wt 131.0 lb

## 2013-08-27 DIAGNOSIS — B20 Human immunodeficiency virus [HIV] disease: Secondary | ICD-10-CM

## 2013-08-27 DIAGNOSIS — Z23 Encounter for immunization: Secondary | ICD-10-CM

## 2013-08-27 MED ORDER — RILPIVIRINE HCL 25 MG PO TABS: 25.0000 mg | ORAL_TABLET | Freq: Every day | ORAL | Status: DC

## 2013-08-27 MED ORDER — LAMIVUDINE 10 MG/ML PO SOLN: 300.0000 mg | Freq: Every day | ORAL | Status: DC

## 2013-08-27 MED ORDER — ABACAVIR SULFATE 20 MG/ML PO SOLN: 600.0000 mg | Freq: Every day | ORAL | Status: DC

## 2013-08-27 MED ORDER — DOLUTEGRAVIR SODIUM 50 MG PO TABS: 50.0000 mg | ORAL_TABLET | Freq: Every day | ORAL | Status: DC

## 2013-08-27 MED ORDER — FLUCONAZOLE 40 MG/ML PO SUSR: 200.0000 mg | Freq: Every day | ORAL | Status: DC

## 2013-08-27 NOTE — Assessment & Plan Note (Signed)
She prefers liquids.  I will try liquid abacavir, lamivudin plus crushed Tivicay and she thinks she can swallow rilpivirine until genotype returns and may drop abacavir or lamivudine or even both.  May also consider crushed epzicom if no resistance and able to take tivicay.

## 2013-08-27 NOTE — Progress Notes (Signed)
   Subjective:    Patient ID: Kara Allen, female    DOB: Feb 15, 1980, 33 y.o.   MRN: [id number]  HPI Returning patient.  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  Was taking intermittently though says she kept getting refills from ED.  Genotype pending today.  CD4 of 90, vl [id number].  Some thrush noted at intake.  Difficulty swallowing last 2 years and can't take pills.  Has crushed in the past.  No recent hospitalizations.     Review of Systems  Constitutional: Negative for fever, fatigue and unexpected weight change.  HENT: Positive for trouble swallowing.   Eyes: Negative for visual disturbance.  Gastrointestinal: Negative for nausea and diarrhea.  Genitourinary: Negative for dysuria.  Musculoskeletal: Negative for myalgias.  Skin: Negative for rash.  Neurological: Negative for dizziness and light-headedness.  Psychiatric/Behavioral: Negative for sleep disturbance.       Objective:   Physical Exam  Constitutional: She appears well-developed and well-nourished. No distress.  HENT:  Mouth/Throat: No oropharyngeal exudate.  No thrush noted now  Eyes: No scleral icterus.  Cardiovascular: Normal rate, regular rhythm and normal heart sounds.   No murmur heard. Pulmonary/Chest: Effort normal and breath sounds normal. No respiratory distress. She has no wheezes.  Abdominal: Soft. Bowel sounds are normal. She exhibits no distension.  Musculoskeletal: She exhibits no edema.  Lymphadenopathy:    She has no cervical adenopathy.  Skin: No rash noted.          Assessment & Plan:

## 2013-09-01 LAB — HIV-1 GENOTYPR PLUS

## 2013-09-02 ENCOUNTER — Telehealth: Payer: Self-pay | Admitting: *Deleted

## 2013-09-02 DIAGNOSIS — B20 Human immunodeficiency virus [HIV] disease: Secondary | ICD-10-CM

## 2013-09-02 MED ORDER — DOLUTEGRAVIR SODIUM 50 MG PO TABS: 50.0000 mg | ORAL_TABLET | Freq: Every day | ORAL | Status: DC

## 2013-09-02 MED ORDER — RILPIVIRINE HCL 25 MG PO TABS: 25.0000 mg | ORAL_TABLET | Freq: Every day | ORAL | Status: DC

## 2013-09-02 MED ORDER — ABACAVIR SULFATE 20 MG/ML PO SOLN: 600.0000 mg | Freq: Every day | ORAL | Status: DC

## 2013-09-02 MED ORDER — LAMIVUDINE 10 MG/ML PO SOLN: 300.0000 mg | Freq: Every day | ORAL | Status: DC

## 2013-09-02 NOTE — Telephone Encounter (Signed)
HIV rxes should  be ready for pick-up later today

## 2013-09-02 NOTE — Telephone Encounter (Addendum)
[facility] does not have the medications.  Switched medications to [facility] on [address] per the pt's request.

## 2013-09-10 ENCOUNTER — Other Ambulatory Visit: Payer: Medicaid Other

## 2013-09-10 DIAGNOSIS — B20 Human immunodeficiency virus [HIV] disease: Secondary | ICD-10-CM

## 2013-09-10 LAB — CBC WITH DIFFERENTIAL/PLATELET
Basophils Absolute: 0 10*3/uL (ref 0.0–0.1)
Basophils Relative: 0 % (ref 0–1)
EOS ABS: 0 10*3/uL (ref 0.0–0.7)
Eosinophils Relative: 1 % (ref 0–5)
HEMATOCRIT: 31.7 % — AB (ref 36.0–46.0)
HEMOGLOBIN: 10.6 g/dL — AB (ref 12.0–15.0)
Lymphocytes Relative: 28 % (ref 12–46)
Lymphs Abs: 1.1 10*3/uL (ref 0.7–4.0)
MCH: 30 pg (ref 26.0–34.0)
MCHC: 33.4 g/dL (ref 30.0–36.0)
MCV: 89.8 fL (ref 78.0–100.0)
MONO ABS: 0.4 10*3/uL (ref 0.1–1.0)
MONOS PCT: 10 % (ref 3–12)
Neutro Abs: 2.4 10*3/uL (ref 1.7–7.7)
Neutrophils Relative %: 61 % (ref 43–77)
Platelets: 221 10*3/uL (ref 150–400)
RBC: 3.53 MIL/uL — ABNORMAL LOW (ref 3.87–5.11)
RDW: 17 % — AB (ref 11.5–15.5)
WBC: 3.9 10*3/uL — ABNORMAL LOW (ref 4.0–10.5)

## 2013-09-10 LAB — COMPLETE METABOLIC PANEL WITH GFR
ALT: 8 U/L (ref 0–35)
AST: 25 U/L (ref 0–37)
Albumin: 2.9 g/dL — ABNORMAL LOW (ref 3.5–5.2)
Alkaline Phosphatase: 67 U/L (ref 39–117)
BILIRUBIN TOTAL: 0.2 mg/dL (ref 0.2–1.2)
BUN: 7 mg/dL (ref 6–23)
CO2: 26 mEq/L (ref 19–32)
Calcium: 9.2 mg/dL (ref 8.4–10.5)
Chloride: 102 mEq/L (ref 96–112)
Creat: 0.64 mg/dL (ref 0.50–1.10)
GFR, Est African American: >89 mL/min
GLUCOSE: 54 mg/dL — AB (ref 70–99)
POTASSIUM: 3.3 meq/L — AB (ref 3.5–5.3)
Sodium: 134 mEq/L — ABNORMAL LOW (ref 135–145)
TOTAL PROTEIN: 9.5 g/dL — AB (ref 6.0–8.3)

## 2013-09-11 LAB — T-HELPER CELL (CD4) - (RCID CLINIC ONLY)
CD4 T CELL HELPER: 10 % — AB (ref 33–55)
CD4 T Cell Abs: 100 /uL — ABNORMAL LOW (ref 400–2700)

## 2013-09-11 LAB — HIV-1 RNA QUANT-NO REFLEX-BLD
HIV 1 RNA Quant: 256 copies/mL — ABNORMAL HIGH (ref <20)
HIV-1 RNA QUANT, LOG: 2.41 {Log} — AB (ref <1.30)

## 2013-09-24 ENCOUNTER — Ambulatory Visit (INDEPENDENT_AMBULATORY_CARE_PROVIDER_SITE_OTHER): Payer: Medicaid Other | Admitting: Internal Medicine

## 2013-09-24 ENCOUNTER — Encounter: Payer: Self-pay | Admitting: Internal Medicine

## 2013-09-24 ENCOUNTER — Other Ambulatory Visit: Payer: Self-pay | Admitting: Internal Medicine

## 2013-09-24 VITALS — BP 113/78 | HR 78 | Temp 98.3°F | Wt 133.0 lb

## 2013-09-24 DIAGNOSIS — B20 Human immunodeficiency virus [HIV] disease: Secondary | ICD-10-CM

## 2013-09-24 DIAGNOSIS — N926 Irregular menstruation, unspecified: Secondary | ICD-10-CM

## 2013-09-24 NOTE — Progress Notes (Signed)
   Subjective:    Patient ID: Kara Allen, female    DOB: 1980/02/16, 33 y.o.   MRN: [id number]  HPI Returning patient for her second visit.  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  Was taking intermittently though says she kept getting refills from ED by her report.  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  CD4 of 90, vl 37969 and now CD4 up to 100 and vl 269.  Some thrush noted at intake now resolved.  Has had difficulty swallowing pills and started on rilpivirine, tivicay which she is able to swallow and lamivudine with abacavir solution.     Review of Systems  Constitutional: Negative for fever, fatigue and unexpected weight change.  HENT: Negative for trouble swallowing.   Gastrointestinal: Negative for nausea and diarrhea.  Musculoskeletal: Positive for myalgias.  Skin: Negative for rash.  Neurological: Negative for dizziness and light-headedness.  Psychiatric/Behavioral: Negative for sleep disturbance.       Objective:   Physical Exam  Constitutional: She appears well-developed and well-nourished. No distress.  HENT:  Mouth/Throat: Oropharynx is clear and moist. No oropharyngeal exudate.  Eyes: No scleral icterus.  Cardiovascular: Normal rate, regular rhythm and normal heart sounds.   No murmur heard. Pulmonary/Chest: Effort normal and breath sounds normal. No respiratory distress. She has no wheezes.  Lymphadenopathy:    She has no cervical adenopathy.  Skin: No rash noted.          Assessment & Plan:

## 2013-09-24 NOTE — Assessment & Plan Note (Signed)
She is pleased with regimen.  If remains suppressed and no other mutations, will stop abacavir next visit and just continue with lamivudine, tivicay and rilpivirine.

## 2013-09-25 LAB — T-HELPER CELL (CD4) - (RCID CLINIC ONLY)
CD4 % Helper T Cell: 11 % — ABNORMAL LOW (ref 33–55)
CD4 T Cell Abs: 130 /uL — ABNORMAL LOW (ref 400–2700)

## 2013-09-27 LAB — HIV-1 RNA QUANT-NO REFLEX-BLD
HIV 1 RNA Quant: 40 copies/mL — ABNORMAL HIGH (ref <20)
HIV-1 RNA Quant, Log: 1.6 {Log} — ABNORMAL HIGH (ref <1.30)

## 2013-09-30 ENCOUNTER — Inpatient Hospital Stay (HOSPITAL_COMMUNITY)
Admission: EM | Admit: 2013-09-30 | Discharge: 2013-10-03 | DRG: 975 | Disposition: A | Discharge status: Home / Self Care | Payer: Medicaid Other | Attending: Internal Medicine | Admitting: Internal Medicine

## 2013-09-30 ENCOUNTER — Encounter (HOSPITAL_COMMUNITY): Payer: Self-pay | Admitting: Emergency Medicine

## 2013-09-30 ENCOUNTER — Emergency Department (HOSPITAL_COMMUNITY): Payer: Medicaid Other

## 2013-09-30 DIAGNOSIS — N1330 Unspecified hydronephrosis: Secondary | ICD-10-CM | POA: Diagnosis present

## 2013-09-30 DIAGNOSIS — F1190 Opioid use, unspecified, uncomplicated: Secondary | ICD-10-CM | POA: Diagnosis present

## 2013-09-30 DIAGNOSIS — Z21 Asymptomatic human immunodeficiency virus [HIV] infection status: Secondary | ICD-10-CM | POA: Diagnosis present

## 2013-09-30 DIAGNOSIS — A599 Trichomoniasis, unspecified: Secondary | ICD-10-CM | POA: Diagnosis present

## 2013-09-30 DIAGNOSIS — B20 Human immunodeficiency virus [HIV] disease: Secondary | ICD-10-CM | POA: Diagnosis present

## 2013-09-30 DIAGNOSIS — F17200 Nicotine dependence, unspecified, uncomplicated: Secondary | ICD-10-CM | POA: Diagnosis present

## 2013-09-30 DIAGNOSIS — N3000 Acute cystitis without hematuria: Secondary | ICD-10-CM

## 2013-09-30 DIAGNOSIS — Z91040 Latex allergy status: Secondary | ICD-10-CM | POA: Diagnosis not present

## 2013-09-30 DIAGNOSIS — Z881 Allergy status to other antibiotic agents status: Secondary | ICD-10-CM | POA: Diagnosis not present

## 2013-09-30 DIAGNOSIS — A419 Sepsis, unspecified organism: Secondary | ICD-10-CM | POA: Diagnosis present

## 2013-09-30 DIAGNOSIS — N12 Tubulo-interstitial nephritis, not specified as acute or chronic: Secondary | ICD-10-CM | POA: Diagnosis present

## 2013-09-30 DIAGNOSIS — R109 Unspecified abdominal pain: Secondary | ICD-10-CM | POA: Diagnosis present

## 2013-09-30 HISTORY — DX: Asymptomatic human immunodeficiency virus (hiv) infection status: Z21

## 2013-09-30 HISTORY — DX: Human immunodeficiency virus (HIV) disease: B20

## 2013-09-30 LAB — CBC WITH DIFFERENTIAL/PLATELET
Basophils Absolute: 0 10*3/uL (ref 0.0–0.1)
Basophils Relative: 0 % (ref 0–1)
EOS PCT: 1 % (ref 0–5)
Eosinophils Absolute: 0 10*3/uL (ref 0.0–0.7)
HCT: 30.9 % — ABNORMAL LOW (ref 36.0–46.0)
Hemoglobin: 10.6 g/dL — ABNORMAL LOW (ref 12.0–15.0)
Lymphocytes Relative: 28 % (ref 12–46)
Lymphs Abs: 1.5 10*3/uL (ref 0.7–4.0)
MCH: 31.1 pg (ref 26.0–34.0)
MCHC: 34.3 g/dL (ref 30.0–36.0)
MCV: 90.6 fL (ref 78.0–100.0)
Monocytes Absolute: 0.6 10*3/uL (ref 0.1–1.0)
Monocytes Relative: 10 % (ref 3–12)
NEUTROS ABS: 3.4 10*3/uL (ref 1.7–7.7)
Neutrophils Relative %: 61 % (ref 43–77)
Platelets: 259 10*3/uL (ref 150–400)
RBC: 3.41 MIL/uL — ABNORMAL LOW (ref 3.87–5.11)
RDW: 15.9 % — AB (ref 11.5–15.5)
WBC: 5.6 10*3/uL (ref 4.0–10.5)

## 2013-09-30 LAB — URINALYSIS, ROUTINE W REFLEX MICROSCOPIC
BILIRUBIN URINE: NEGATIVE
Glucose, UA: NEGATIVE mg/dL
Ketones, ur: NEGATIVE mg/dL
NITRITE: NEGATIVE
PH: 7 (ref 5.0–8.0)
Protein, ur: 100 mg/dL — AB
SPECIFIC GRAVITY, URINE: 1.015 (ref 1.005–1.030)
Urobilinogen, UA: 0.2 mg/dL (ref 0.0–1.0)

## 2013-09-30 LAB — URINE MICROSCOPIC-ADD ON

## 2013-09-30 LAB — COMPREHENSIVE METABOLIC PANEL
ALT: 6 U/L (ref 0–35)
AST: 15 U/L (ref 0–37)
Albumin: 2.7 g/dL — ABNORMAL LOW (ref 3.5–5.2)
Alkaline Phosphatase: 77 U/L (ref 39–117)
Anion gap: 14 (ref 5–15)
BUN: 12 mg/dL (ref 6–23)
CALCIUM: 9.3 mg/dL (ref 8.4–10.5)
CO2: 21 mEq/L (ref 19–32)
Chloride: 98 mEq/L (ref 96–112)
Creatinine, Ser: 0.6 mg/dL (ref 0.50–1.10)
GFR calc Af Amer: >90 mL/min (ref >90)
Glucose, Bld: 98 mg/dL (ref 70–99)
Potassium: 3.9 mEq/L (ref 3.7–5.3)
Sodium: 133 mEq/L — ABNORMAL LOW (ref 137–147)
Total Bilirubin: 0.3 mg/dL (ref 0.3–1.2)
Total Protein: 10.8 g/dL — ABNORMAL HIGH (ref 6.0–8.3)

## 2013-09-30 LAB — LIPASE, BLOOD: Lipase: 27 U/L (ref 11–59)

## 2013-09-30 LAB — WET PREP, GENITAL: Yeast Wet Prep HPF POC: NONE SEEN

## 2013-09-30 LAB — PREGNANCY, URINE: PREG TEST UR: NEGATIVE

## 2013-09-30 MED ORDER — METRONIDAZOLE 500 MG PO TABS
2000.0000 mg | ORAL_TABLET | Freq: Once | ORAL | Status: AC
  Administered 2013-09-30: 2000 mg via ORAL
  Filled 2013-09-30: qty 4

## 2013-09-30 MED ORDER — SODIUM CHLORIDE 0.9 % IV BOLUS (SEPSIS)
1000.0000 mL | Freq: Once | INTRAVENOUS | Status: AC
  Administered 2013-09-30: 1000 mL via INTRAVENOUS

## 2013-09-30 MED ORDER — IOHEXOL 300 MG/ML  SOLN
25.0000 mL | Freq: Once | INTRAMUSCULAR | Status: AC | PRN
  Administered 2013-09-30: 25 mL via ORAL

## 2013-09-30 MED ORDER — ACETAMINOPHEN 325 MG PO TABS
650.0000 mg | ORAL_TABLET | Freq: Once | ORAL | Status: DC
  Filled 2013-09-30: qty 2

## 2013-09-30 MED ORDER — AZITHROMYCIN 250 MG PO TABS
1000.0000 mg | ORAL_TABLET | Freq: Once | ORAL | Status: AC
  Administered 2013-09-30: 1000 mg via ORAL
  Filled 2013-09-30: qty 4

## 2013-09-30 MED ORDER — IOHEXOL 300 MG/ML  SOLN
80.0000 mL | Freq: Once | INTRAMUSCULAR | Status: AC | PRN
  Administered 2013-09-30: 80 mL via INTRAVENOUS

## 2013-09-30 MED ORDER — ONDANSETRON HCL 4 MG/2ML IJ SOLN
4.0000 mg | Freq: Once | INTRAMUSCULAR | Status: AC
  Administered 2013-09-30: 4 mg via INTRAVENOUS
  Filled 2013-09-30: qty 2

## 2013-09-30 MED ORDER — DEXTROSE 5 % IV SOLN
1.0000 g | Freq: Once | INTRAVENOUS | Status: AC
  Administered 2013-09-30: 1 g via INTRAVENOUS
  Filled 2013-09-30: qty 10

## 2013-09-30 MED ORDER — IBUPROFEN 200 MG PO TABS
600.0000 mg | ORAL_TABLET | Freq: Once | ORAL | Status: AC
  Administered 2013-09-30: 600 mg via ORAL
  Filled 2013-09-30: qty 3

## 2013-09-30 MED ORDER — HYDROMORPHONE HCL 1 MG/ML IJ SOLN
1.0000 mg | Freq: Once | INTRAMUSCULAR | Status: AC
  Administered 2013-09-30: 1 mg via INTRAVENOUS
  Filled 2013-09-30: qty 1

## 2013-09-30 NOTE — ED Notes (Signed)
Pelvic cart ready @ bedside. Pt fully undressed waist down. 

## 2013-09-30 NOTE — ED Notes (Signed)
Pt here for lower back pain and abd pain with more pain to mid lower abd\. Has been going on for a while and pain is intermittent. Has been back on her HIV meds and thought she would get better. Has vomiting, no diarrhea.

## 2013-09-30 NOTE — ED Provider Notes (Signed)
CSN: [id number]     Arrival date & time 09/30/13  1503 History   First MD Initiated Contact with Patient 09/30/13 1800     Chief Complaint  Patient presents with  . Back Pain  . Abdominal Pain     (Consider location/radiation/quality/duration/timing/severity/associated sxs/prior Treatment) HPI Pt is a 33yo female with hx of HIV, CD4 count of 130 followed by ID, restarted on "HIV meds" 2 months ago.  Pt states she has had lower back and abdominal pain for about 2 years but pain worsened after restarting medications 2 months ago.  Pain has become so bad it is difficult for her to get into a comfortable position.  Pain is aching and sharp, 10/10, worse with movement and and certain positions. Pain is worst in mid and lower abdomen, and lower back.  Reports associated nausea and vomiting, vomited 3 times today. Also c/o dysuria, denies diarrhea.  Reports chills but no known fever.  Reports vaginal discharge but states she is not concerned for STD as she was tested 2 month ago when she was restarted on her HIV medications and has not had any new sexual partners. Denies previous hx of renal stones. Denies hx of abdominal surgeries.    Past Medical History  Diagnosis Date  . HIV (human immunodeficiency virus infection)    Past Surgical History  Procedure Laterality Date  . Cesarean section     No family history on file. History  Substance Use Topics  . Smoking status: Current Every Day Smoker    Types: Cigarettes    Last Attempt to Quit: 06/13/2013  . Smokeless tobacco: Never Used  . Alcohol Use: No   OB History   Grav Para Term Preterm Abortions TAB SAB Ect Mult Living                 Review of Systems  Constitutional: Positive for chills. Negative for fever.  Respiratory: Negative for cough and shortness of breath.   Cardiovascular: Negative for chest pain and palpitations.  Gastrointestinal: Positive for nausea, vomiting and abdominal pain. Negative for diarrhea and constipation.   Genitourinary: Positive for dysuria, urgency, flank pain, vaginal discharge and pelvic pain. Negative for hematuria, decreased urine volume, vaginal bleeding and vaginal pain.  Musculoskeletal: Positive for back pain and myalgias.  All other systems reviewed and are negative.     Allergies  Latex; Sulfamethoxazole-trimethoprim; and Tylenol  Home Medications   Prior to Admission medications   Medication Sig Start Date End Date Taking? Authorizing Provider  abacavir (ZIAGEN) 20 MG/ML solution Take 30 mLs (600 mg total) by mouth daily. 09/02/13  Yes Cliffton Asters, MD  dolutegravir (TIVICAY) 50 MG tablet Take 1 tablet (50 mg total) by mouth daily. 09/02/13  Yes Cliffton Asters, MD  lamiVUDine (EPIVIR) 10 MG/ML solution Take 30 mLs (300 mg total) by mouth daily. 09/02/13  Yes Cliffton Asters, MD  rilpivirine (EDURANT) 25 MG TABS tablet Take 1 tablet (25 mg total) by mouth daily with breakfast. 09/02/13  Yes Cliffton Asters, MD   BP 145/75  Pulse 90  Temp(Src) 99.7 F (37.6 C) (Oral)  Resp 22  SpO2 100%  LMP 09/26/2013 Physical Exam  Nursing note and vitals reviewed. Constitutional: She appears well-developed and well-nourished. She appears distressed.  Pt lying on right side in exam bed, appears uncomfortable, emesis bags at her side. No active vomiting.   HENT:  Head: Normocephalic and atraumatic.  Eyes: Conjunctivae are normal. No scleral icterus.  Neck: Normal range of motion.  Cardiovascular:  Normal rate, regular rhythm and normal heart sounds.   Regular rate and rhythm   Pulmonary/Chest: Effort normal and breath sounds normal. No respiratory distress. She has no wheezes. She has no rales. She exhibits no tenderness.  Abdominal: Soft. Bowel sounds are normal. She exhibits no distension and no mass. There is tenderness. There is guarding and CVA tenderness. There is no rebound.  Soft, nondistended, diffuse abdominal tenderness, worst in mid abdomen and RLQ with voluntary guarding. bilateral  CVAT  Genitourinary:  Chaperoned exam External genitalia: normal Vaginal: negative discharge, positive bleeding Cervix: closed: negative discharge, positive bleeding. Cervical motion absent,  Adnexa palpated: negative adnexal tenderness, negative adnexal mass  Bleeding consistent with being on menstrual cycle.    Musculoskeletal: Normal range of motion.  Neurological: She is alert.  Skin: Skin is warm and dry. She is not diaphoretic.    ED Course  Procedures (including critical care time) Labs Review Labs Reviewed  WET PREP, GENITAL - Abnormal; Notable for the following:    Trich, Wet Prep MANY (*)    Clue Cells Wet Prep HPF POC FEW (*)    WBC, Wet Prep HPF POC TOO NUMEROUS TO COUNT (*)    All other components within normal limits  CBC WITH DIFFERENTIAL - Abnormal; Notable for the following:    RBC 3.41 (*)    Hemoglobin 10.6 (*)    HCT 30.9 (*)    RDW 15.9 (*)    All other components within normal limits  COMPREHENSIVE METABOLIC PANEL - Abnormal; Notable for the following:    Sodium 133 (*)    Total Protein 10.8 (*)    Albumin 2.7 (*)    All other components within normal limits  URINALYSIS, ROUTINE W REFLEX MICROSCOPIC - Abnormal; Notable for the following:    Color, Urine AMBER (*)    APPearance TURBID (*)    Hgb urine dipstick LARGE (*)    Protein, ur 100 (*)    Leukocytes, UA LARGE (*)    All other components within normal limits  URINE MICROSCOPIC-ADD ON - Abnormal; Notable for the following:    Bacteria, UA MANY (*)    All other components within normal limits  GC/CHLAMYDIA PROBE AMP  LIPASE, BLOOD  PREGNANCY, URINE    Imaging Review Ct Abdomen Pelvis W Contrast  09/30/2013   CLINICAL DATA:  Mid and lower back and abdominal pain intermittently ; vomiting without diarrhea; on medication for age by the  EXAM: CT ABDOMEN AND PELVIS WITH CONTRAST  TECHNIQUE: Multidetector CT imaging of the abdomen and pelvis was performed using the standard protocol following  bolus administration of intravenous contrast.  CONTRAST:  80mL OMNIPAQUE IOHEXOL 300 MG/ML SOLN intravenously. The patient also received oral contrast material.  COMPARISON:  None.  FINDINGS: The left kidney is enlarged and contains multiple hypodensities which communicate with one-another which exhibit HU IUD of between +2 and +25. There is left mild hydronephrosis and proximal hydroureter without a definite obstructing stone. On the right there is minimal hypodensity in the upper pole. The perinephric fat exhibits normal density bilaterally. The urinary bladder is only partially distended.  The liver contains calcifications consistent with previous granulomatous infection. There is no focal mass or ductal dilation. The gallbladder, spleen, and moderately distended stomach are grossly normal. There is mild ductal prominence throughout the course of the pancreas without evidence of masses or inflammation. The adrenal glands are normal in size.  There is an enlarged lymph node medial to the left kidney which measures  2.8 x 2.3 x 3.3 cm. There are additional enlarged periaortic and pericaval lymph nodes. The caliber of the abdominal aorta is normal. Evaluation of the small and large bowel is limited in that the contrast has reached only the proximal small bowel. There is no obstructive pattern. One cannot exclude mural thickening especially in the ascending colon.  Within the pelvis there is a left adnexal fluid collection. Which measures 4.6 x 2.7 cm. The uterus is unremarkable. There is no right adnexal mass. There are numerous normal-sized to borderline enlarged pelvic lymph nodes. There is no inguinal nor umbilical hernia.  The lung bases exhibit no acute abnormalities. The lumbar spine and bony pelvis are unremarkable.  IMPRESSION: 1. The left kidney is enlarged and exhibits multiple low-density areas which communicate with one-another which may reflect cysts, abscesses, or chronic caliectasis. There is also mild  left hydronephrosis without obvious etiology. Renal ultrasound may be useful in better characterizing the low-density regions as simple cystic or non simple cystic structures. Thereis a similar appearing finding in the upper pole of the right kidney. There are mildly enlarged periaortic and pericaval and left perirenal lymph nodes. 2. There is no acute hepatobiliary abnormality. There is mild ductal prominence of the otherwise normal-appearing pancreas. 3. No acute bowel abnormality is demonstrated. 4. There is a cystic appearing left adnexal process with HU value of +17. Pelvic ultrasound is recommended to further evaluate this region if the patient is having any symptoms referable to the pelvis.   Electronically Signed   By: David  [place]   On: 09/30/2013 20:56     EKG Interpretation None      MDM   Final diagnoses:  Pyelonephritis  Trichomoniasis  HIV (human immunodeficiency virus infection)    Pt is a 33yo female with hx of HIV c/o back and abdominal pain, worsening over 2 months.  Reports some vaginal discharge and dysuria. Labs significant for UA-evidence of UTI, however with c/o back pain and temp, dx with pyelonephritis.  Wet prep significant for trich.  Pt given IV rocephin.  Pt also given PO azithromycin and metronidazole.   Discussed pt with Dr. Criss Alvine who advised to admit pt for pyelonephrosis due to hx of HIV and CD4 count of 130  Dr. Criss Alvine consulted with urology due to findings on CT abd/pelvis which shows an enlarged left kidney with multiple low-density area which communicate with one-another, which may reflect cysts, abscesses or chroniccaliectasis.  Urology, Dr. Laverle Patter,  stated he does not believe lesions are abscesses, however, renal ultrasound may be performed while pt admitted if determined by medical admission.    11:12 PM Consulted with Dr. Lovell Sheehan, agreed to admit pt for further treatment of pyelonephritis.      Junius Finner, PA-C 09/30/13 2317

## 2013-10-01 ENCOUNTER — Encounter (HOSPITAL_COMMUNITY): Payer: Self-pay | Admitting: *Deleted

## 2013-10-01 ENCOUNTER — Inpatient Hospital Stay (HOSPITAL_COMMUNITY): Payer: Medicaid Other

## 2013-10-01 DIAGNOSIS — Z21 Asymptomatic human immunodeficiency virus [HIV] infection status: Secondary | ICD-10-CM | POA: Diagnosis present

## 2013-10-01 DIAGNOSIS — N12 Tubulo-interstitial nephritis, not specified as acute or chronic: Secondary | ICD-10-CM

## 2013-10-01 DIAGNOSIS — N1330 Unspecified hydronephrosis: Secondary | ICD-10-CM | POA: Diagnosis present

## 2013-10-01 DIAGNOSIS — A599 Trichomoniasis, unspecified: Secondary | ICD-10-CM | POA: Diagnosis present

## 2013-10-01 DIAGNOSIS — A419 Sepsis, unspecified organism: Secondary | ICD-10-CM | POA: Diagnosis present

## 2013-10-01 DIAGNOSIS — B20 Human immunodeficiency virus [HIV] disease: Secondary | ICD-10-CM | POA: Diagnosis present

## 2013-10-01 LAB — BASIC METABOLIC PANEL
ANION GAP: 9 (ref 5–15)
BUN: 9 mg/dL (ref 6–23)
CALCIUM: 8.3 mg/dL — AB (ref 8.4–10.5)
CO2: 21 mEq/L (ref 19–32)
Chloride: 102 mEq/L (ref 96–112)
Creatinine, Ser: 0.55 mg/dL (ref 0.50–1.10)
GFR calc Af Amer: >90 mL/min (ref >90)
Glucose, Bld: 97 mg/dL (ref 70–99)
Potassium: 3.5 mEq/L — ABNORMAL LOW (ref 3.7–5.3)
SODIUM: 132 meq/L — AB (ref 137–147)

## 2013-10-01 LAB — GC/CHLAMYDIA PROBE AMP
CT Probe RNA: NEGATIVE
GC Probe RNA: NEGATIVE

## 2013-10-01 LAB — CBC
HCT: 26.4 % — ABNORMAL LOW (ref 36.0–46.0)
Hemoglobin: 8.7 g/dL — ABNORMAL LOW (ref 12.0–15.0)
MCH: 30.1 pg (ref 26.0–34.0)
MCHC: 33 g/dL (ref 30.0–36.0)
MCV: 91.3 fL (ref 78.0–100.0)
PLATELETS: 215 10*3/uL (ref 150–400)
RBC: 2.89 MIL/uL — ABNORMAL LOW (ref 3.87–5.11)
RDW: 16 % — AB (ref 11.5–15.5)
WBC: 3.9 10*3/uL — AB (ref 4.0–10.5)

## 2013-10-01 LAB — MRSA PCR SCREENING: MRSA by PCR: NEGATIVE

## 2013-10-01 MED ORDER — RILPIVIRINE HCL 25 MG PO TABS
25.0000 mg | ORAL_TABLET | Freq: Every day | ORAL | Status: DC
  Administered 2013-10-01 – 2013-10-02 (×2): 25 mg via ORAL
  Filled 2013-10-01 (×3): qty 1

## 2013-10-01 MED ORDER — DEXTROSE 5 % IV SOLN: 1.0000 g | INTRAVENOUS | Status: DC

## 2013-10-01 MED ORDER — DEXTROSE 5 % IV SOLN
1.0000 g | INTRAVENOUS | Status: DC
  Administered 2013-10-01 – 2013-10-02 (×2): 1 g via INTRAVENOUS
  Filled 2013-10-01 (×3): qty 10

## 2013-10-01 MED ORDER — POTASSIUM CHLORIDE CRYS ER 20 MEQ PO TBCR
40.0000 meq | EXTENDED_RELEASE_TABLET | Freq: Once | ORAL | Status: DC
  Filled 2013-10-01: qty 2

## 2013-10-01 MED ORDER — DOLUTEGRAVIR SODIUM 50 MG PO TABS
50.0000 mg | ORAL_TABLET | Freq: Every day | ORAL | Status: DC
  Filled 2013-10-01: qty 1

## 2013-10-01 MED ORDER — ONDANSETRON HCL 4 MG PO TABS
4.0000 mg | ORAL_TABLET | Freq: Four times a day (QID) | ORAL | Status: DC | PRN
Start: 2013-10-01 — End: 2013-10-03

## 2013-10-01 MED ORDER — DOLUTEGRAVIR SODIUM 50 MG PO TABS
50.0000 mg | ORAL_TABLET | Freq: Every day | ORAL | Status: DC
  Administered 2013-10-01 – 2013-10-02 (×2): 50 mg via ORAL
  Filled 2013-10-01 (×3): qty 1

## 2013-10-01 MED ORDER — HYDROMORPHONE HCL 1 MG/ML IJ SOLN
0.5000 mg | INTRAMUSCULAR | Status: DC | PRN
  Administered 2013-10-01 (×3): 1 mg via INTRAVENOUS
  Administered 2013-10-01 (×2): 0.5 mg via INTRAVENOUS
  Administered 2013-10-01 – 2013-10-03 (×10): 1 mg via INTRAVENOUS
  Filled 2013-10-01 (×16): qty 1

## 2013-10-01 MED ORDER — ENOXAPARIN SODIUM 30 MG/0.3ML ~~LOC~~ SOLN
30.0000 mg | SUBCUTANEOUS | Status: DC
  Administered 2013-10-01: 30 mg via SUBCUTANEOUS
  Filled 2013-10-01: qty 0.3

## 2013-10-01 MED ORDER — ALUM & MAG HYDROXIDE-SIMETH 200-200-20 MG/5ML PO SUSP: 30.0000 mL | Freq: Four times a day (QID) | ORAL | Status: DC | PRN

## 2013-10-01 MED ORDER — SODIUM CHLORIDE 0.9 % IV SOLN
INTRAVENOUS | Status: DC
  Administered 2013-10-01 (×2): via INTRAVENOUS
  Administered 2013-10-01: 100 mL/h via INTRAVENOUS
  Administered 2013-10-02: 21:00:00 via INTRAVENOUS

## 2013-10-01 MED ORDER — LIDOCAINE HCL 2 % EX GEL
1.0000 "application " | Freq: Once | CUTANEOUS | Status: AC
  Administered 2013-10-01: 1 via URETHRAL
  Filled 2013-10-01: qty 5

## 2013-10-01 MED ORDER — ABACAVIR SULFATE 300 MG PO TABS
600.0000 mg | ORAL_TABLET | Freq: Every day | ORAL | Status: DC
  Filled 2013-10-01: qty 2

## 2013-10-01 MED ORDER — ABACAVIR SULFATE 20 MG/ML PO SOLN
600.0000 mg | Freq: Every day | ORAL | Status: DC
  Administered 2013-10-01 – 2013-10-02 (×2): 600 mg via ORAL
  Filled 2013-10-01: qty 30

## 2013-10-01 MED ORDER — ONDANSETRON HCL 4 MG/2ML IJ SOLN
4.0000 mg | Freq: Four times a day (QID) | INTRAMUSCULAR | Status: DC | PRN
  Administered 2013-10-01 – 2013-10-03 (×6): 4 mg via INTRAVENOUS
  Filled 2013-10-01 (×6): qty 2

## 2013-10-01 MED ORDER — LAMIVUDINE 10 MG/ML PO SOLN
300.0000 mg | Freq: Every day | ORAL | Status: DC
  Administered 2013-10-01 – 2013-10-02 (×2): 300 mg via ORAL
  Filled 2013-10-01 (×3): qty 30

## 2013-10-01 MED ORDER — OXYCODONE HCL 5 MG PO TABS
5.0000 mg | ORAL_TABLET | ORAL | Status: DC | PRN
  Administered 2013-10-01 – 2013-10-03 (×4): 5 mg via ORAL
  Filled 2013-10-01 (×5): qty 1

## 2013-10-01 MED ORDER — ENOXAPARIN SODIUM 40 MG/0.4ML ~~LOC~~ SOLN
40.0000 mg | SUBCUTANEOUS | Status: DC
  Administered 2013-10-02 – 2013-10-03 (×2): 40 mg via SUBCUTANEOUS
  Filled 2013-10-01 (×2): qty 0.4

## 2013-10-01 MED ORDER — RILPIVIRINE HCL 25 MG PO TABS
25.0000 mg | ORAL_TABLET | Freq: Every day | ORAL | Status: DC
  Filled 2013-10-01 (×2): qty 1

## 2013-10-01 MED ORDER — LAMIVUDINE 10 MG/ML PO SOLN
300.0000 mg | Freq: Every day | ORAL | Status: DC
  Filled 2013-10-01: qty 30

## 2013-10-01 NOTE — ED Provider Notes (Signed)
Medical screening examination/treatment/procedure(s) were performed by non-physician practitioner and as supervising physician I was immediately available for consultation/collaboration.   EKG Interpretation None        Renald Haithcock T Chaneka Trefz, MD 10/01/13 0043 

## 2013-10-01 NOTE — Progress Notes (Signed)
Utilization review completed.  

## 2013-10-01 NOTE — Consult Note (Signed)
Urology Consult   Physician requesting consult: Dr. Kathlen ModyVijaya Akula  Reason for consult: Hydronephrosis  History of Present Illness: Kara Allen is a 33 y.o. with HIV/AIDS presenting with diffuse back and abdominal pain. She reports that she has had this pain off and on for 2 years. Yesterday, her pain was more severe and not relieved by marijuana, which is usually how she deals with the pain. She describes this primarily as suprapubic pain and RLQ/LLQ pain that is essentially constant. She also reports back pain that is non-specific and intermittent. She doesn't point to one area in particular, but says it "hurts all over". She also reports epigastric pain yesterday. She was having subjective fevers but does not have a thermometer. She was having nausea and vomiting yesterday and this morning of thin, clear liquid. She has not been able to eat for the last 24-36 hours. She had mild diarrhea this morning.  At baseline, she reports "a messed up bladder". 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 She endorses frequency, dysuria, and 1 episode of gross hematuria in the past. She is "constantly" on antibiotics. She also has odorless white discharge from the vagina at baseline.   She denies  STDs, urolithiasis, GU malignancy/trauma/surgery.  Past Medical History  Diagnosis Date  . HIV (human immunodeficiency virus infection)     Past Surgical History  Procedure Laterality Date  . Cesarean section       Current Hospital Medications:  Home meds:    Medication List    ASK your doctor about these medications       abacavir 20 MG/ML solution  Commonly known as:  ZIAGEN  Take 30 mLs (600 mg total) by mouth daily.     dolutegravir 50 MG tablet  Commonly known as:  TIVICAY  Take 1 tablet (50 mg total) by mouth daily.     lamiVUDine 10 MG/ML solution  Commonly known as:  EPIVIR  Take 30 mLs (300 mg total) by mouth daily.      rilpivirine 25 MG Tabs tablet  Commonly known as:  EDURANT  Take 1 tablet (25 mg total) by mouth daily with breakfast.        Scheduled Meds: . abacavir  600 mg Oral Q supper  . cefTRIAXone (ROCEPHIN)  IV  1 g Intravenous Q24H  . dolutegravir  50 mg Oral Q supper  . [START ON 10/02/2013] enoxaparin (LOVENOX) injection  40 mg Subcutaneous Q24H  . lamiVUDine  300 mg Oral Q supper  . [START ON 10/02/2013] rilpivirine  25 mg Oral Q supper   Continuous Infusions: . sodium chloride 100 mL/hr at 10/01/13 1227   PRN Meds:.alum & mag hydroxide-simeth, HYDROmorphone (DILAUDID) injection, ondansetron (ZOFRAN) IV, ondansetron, oxyCODONE  Allergies:  Allergies  Allergen Reactions  . Latex Other (See Comments)    Irritates bladder  . Sulfamethoxazole-Trimethoprim Other (See Comments)    Irritates bladder  . Tylenol [Acetaminophen] Other (See Comments)    Makes me pee more    History reviewed. No pertinent family history.  Social History:  reports that she has been smoking Cigarettes.  She has been smoking about 0.00 packs per day. She has never used smokeless tobacco. She reports that she uses illicit drugs (Marijuana) about 5 times per week. She reports that she does not drink alcohol.  ROS: A complete review of systems was performed.  All systems are negative except for pertinent findings as noted.  Physical Exam:  Vital signs in last 24 hours: Temp:  [97.8 F (36.6 C)-99.7 F (37.6 C)] 97.8 F (36.6 C) (10/01 1316) Pulse Rate:  [70-118] 70 (10/01 1316) Resp:  [12-22] 16 (10/01 1316) BP: (99-145)/(64-76) 99/67 mmHg (10/01 1316) SpO2:  [98 %-100 %] 100 % (10/01 1316) Weight:  [62.1 kg (136 lb 14.5 oz)] 62.1 kg (136 lb 14.5 oz) (10/01 0006) Constitutional:  Alert and oriented, No acute distress Cardiovascular: Regular rate and rhythm, No JVD Respiratory: Normal respiratory effort, Lungs clear bilaterally GI: Abdomen is soft, nontender, nondistended, no abdominal masses GU:  Musculoskeletal pain on light palpation bilaterally, R>L. CVA tenderness on the RIGHT, but not the LEFT. Lymphatic: No lymphadenopathy Neurologic: Grossly intact, no focal deficits Psychiatric: Normal mood and affect  Laboratory Data:   Recent Labs  09/30/13 1531 10/01/13 0642  WBC 5.6 3.9*  HGB 10.6* 8.7*  HCT 30.9* 26.4*  PLT 259 215     Recent Labs  09/30/13 1531 10/01/13 0642  NA 133* 132*  K 3.9 3.5*  CL 98 102  GLUCOSE 98 97  BUN 12 9  CALCIUM 9.3 8.3*  CREATININE 0.60 0.55     Results for orders placed during the hospital encounter of 09/30/13 (from the past 24 hour(s))  WET PREP, GENITAL     Status: Abnormal   Collection Time    09/30/13  9:13 PM      Result Value Ref Range   Yeast Wet Prep HPF POC NONE SEEN  NONE SEEN   Trich, Wet Prep MANY (*) NONE SEEN   Clue Cells Wet Prep HPF POC FEW (*) NONE SEEN   WBC, Wet Prep HPF POC TOO NUMEROUS TO COUNT (*) NONE SEEN  GC/CHLAMYDIA PROBE AMP     Status: None   Collection Time    09/30/13  9:13 PM      Result Value Ref Range   CT Probe RNA NEGATIVE  NEGATIVE   GC Probe RNA NEGATIVE  NEGATIVE  MRSA PCR SCREENING     Status: None   Collection Time    10/01/13 12:07 AM      Result Value Ref Range   MRSA by PCR NEGATIVE  NEGATIVE  BASIC METABOLIC PANEL     Status: Abnormal   Collection Time    10/01/13  6:42 AM      Result Value Ref Range   Sodium 132 (*) 137 - 147 mEq/L   Potassium 3.5 (*) 3.7 - 5.3 mEq/L   Chloride 102  96 - 112 mEq/L   CO2 21  19 - 32 mEq/L   Glucose, Bld 97  70 - 99 mg/dL   BUN 9  6 - 23 mg/dL   Creatinine, Ser 1.610.55  0.50 - 1.10 mg/dL   Calcium 8.3 (*) 8.4 - 10.5 mg/dL   GFR calc non Af Amer >90  >90 mL/min   GFR calc Af Amer >90  >90 mL/min   Anion gap 9  5 - 15  CBC     Status: Abnormal   Collection Time    10/01/13  6:42 AM      Result Value Ref Range   WBC 3.9 (*) 4.0 - 10.5 K/uL   RBC 2.89 (*) 3.87 - 5.11 MIL/uL   Hemoglobin 8.7 (*) 12.0 - 15.0 g/dL   HCT 09.626.4 (*) 04.536.0 -  46.0 %   MCV 91.3  78.0 - 100.0 fL   MCH 30.1  26.0 - 34.0 pg   MCHC 33.0  30.0 - 36.0 g/dL  RDW 16.0 (*) 11.5 - 15.5 %   Platelets 215  150 - 400 K/uL   Recent Results (from the past 240 hour(s))  WET PREP, GENITAL     Status: Abnormal   Collection Time    09/30/13  9:13 PM      Result Value Ref Range Status   Yeast Wet Prep HPF POC NONE SEEN  NONE SEEN Final   Trich, Wet Prep MANY (*) NONE SEEN Final   Clue Cells Wet Prep HPF POC FEW (*) NONE SEEN Final   WBC, Wet Prep HPF POC TOO NUMEROUS TO COUNT (*) NONE SEEN Final  GC/CHLAMYDIA PROBE AMP     Status: None   Collection Time    09/30/13  9:13 PM      Result Value Ref Range Status   CT Probe RNA NEGATIVE  NEGATIVE Final   GC Probe RNA NEGATIVE  NEGATIVE Final   Comment: (NOTE)                                                                                               **Normal Reference Range: Negative**          Assay performed using the Gen-Probe APTIMA COMBO2 (R) Assay.     Acceptable specimen types for this assay include APTIMA Swabs (Unisex,     endocervical, urethral, or vaginal), first void urine, and ThinPrep     liquid based cytology samples.     Performed at [facility]  MRSA PCR SCREENING     Status: None   Collection Time    10/01/13 12:07 AM      Result Value Ref Range Status   MRSA by PCR NEGATIVE  NEGATIVE Final   Comment:            The GeneXpert MRSA Assay (FDA     approved for NASAL specimens     only), is one component of a     comprehensive MRSA colonization     surveillance program. It is not     intended to diagnose MRSA     infection nor to guide or     monitor treatment for     MRSA infections.    Renal Function:  Recent Labs  09/30/13 1531 10/01/13 0642  CREATININE 0.60 0.55   Estimated Creatinine Clearance: 86.1 ml/min (by C-G formula based on Cr of 0.55).  Radiologic Imaging: Ct Abdomen Pelvis W Contrast  09/30/2013   CLINICAL DATA:  Mid and lower back and abdominal  pain intermittently ; vomiting without diarrhea; on medication for age by the  EXAM: CT ABDOMEN AND PELVIS WITH CONTRAST  TECHNIQUE: Multidetector CT imaging of the abdomen and pelvis was performed using the standard protocol following bolus administration of intravenous contrast.  CONTRAST:  80mL OMNIPAQUE IOHEXOL 300 MG/ML SOLN intravenously. The patient also received oral contrast material.  COMPARISON:  None.  FINDINGS: The left kidney is enlarged and contains multiple hypodensities which communicate with one-another which exhibit HU IUD of between +2 and +25. There is left mild hydronephrosis and proximal hydroureter without a definite obstructing stone. On the right there  is minimal hypodensity in the upper pole. The perinephric fat exhibits normal density bilaterally. The urinary bladder is only partially distended.  The liver contains calcifications consistent with previous granulomatous infection. There is no focal mass or ductal dilation. The gallbladder, spleen, and moderately distended stomach are grossly normal. There is mild ductal prominence throughout the course of the pancreas without evidence of masses or inflammation. The adrenal glands are normal in size.  There is an enlarged lymph node medial to the left kidney which measures 2.8 x 2.3 x 3.3 cm. There are additional enlarged periaortic and pericaval lymph nodes. The caliber of the abdominal aorta is normal. Evaluation of the small and large bowel is limited in that the contrast has reached only the proximal small bowel. There is no obstructive pattern. One cannot exclude mural thickening especially in the ascending colon.  Within the pelvis there is a left adnexal fluid collection. Which measures 4.6 x 2.7 cm. The uterus is unremarkable. There is no right adnexal mass. There are numerous normal-sized to borderline enlarged pelvic lymph nodes. There is no inguinal nor umbilical hernia.  The lung bases exhibit no acute abnormalities. The lumbar  spine and bony pelvis are unremarkable.  IMPRESSION: 1. The left kidney is enlarged and exhibits multiple low-density areas which communicate with one-another which may reflect cysts, abscesses, or chronic caliectasis. There is also mild left hydronephrosis without obvious etiology. Renal ultrasound may be useful in better characterizing the low-density regions as simple cystic or non simple cystic structures. Thereis a similar appearing finding in the upper pole of the right kidney. There are mildly enlarged periaortic and pericaval and left perirenal lymph nodes. 2. There is no acute hepatobiliary abnormality. There is mild ductal prominence of the otherwise normal-appearing pancreas. 3. No acute bowel abnormality is demonstrated. 4. There is a cystic appearing left adnexal process with HU value of +17. Pelvic ultrasound is recommended to further evaluate this region if the patient is having any symptoms referable to the pelvis.   Electronically Signed   By: Daesean Lazarz  [place]   On: 09/30/2013 20:56   US Renal  10/01/2013   CLINICAL DATA:  Followup renal cysts.  EXAM: RENAL/URINARY TRACT ULTRASOUND COMPLETE  COMPARISON:  CT scan 09/30/2013.  FINDINGS: Right Kidney:  Length: 14.4 cm. Normal renal cortical thickness. Mild increased echogenicity (similar to liver). No focal lesions or hydronephrosis. Slightly prominent extra renal pelvis.  Left Kidney:  Length: 14.1 cm. Diffuse increased echogenicity with hydronephrosis and upper left hydroureter. Based on the CT scan there appears to be a an area of strictured narrowing or E UPJ obstruction best seen non the CT scan on image number 45. Also, base of the CT scan the collecting system centrally is not significantly dilated. There is wall thickening of the collecting system and I suspect there are areas of strictured narrowing with dilated/obstructed the calices and possible cavus seal diverticuli. On the ultrasound the fluid in the dilated collecting system appears  complex.  Bladder:  Appears normal for degree of bladder distention.  IMPRESSION: 1. Suspect chronic inflammatory or infectious changes involving the left kidney, mainly based on the prior CT scan. I suspect there are areas of strictured narrowing involving the collecting system with dilated obstructed calices and or possible caliceal diverticulum. 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 2. The right kidney also demonstrates some chronic inflammatory changes but no hydronephrosis or UPJ obstruction. The right ureteral wall appears  thickened on the prior CT scan. 3. The retroperitoneal lymphadenopathy seen on the CT scan is probably explained by chronic inflammation or infection of the kidneys. 4. I would recommend a urology consultation. I think the next best test would either be retrograde ureteropyelograms or possibly an MRI abdomen without and with contrast.   Electronically Signed   By: Loralie Champagne M.D.   On: 10/01/2013 09:03    I independently reviewed the above imaging studies, along with Dr. Isabel Caprice.  Impression/Recommendation: 21F with acute worsening of her chronic diffuse, non-specific abdominal pain and back pain, nausea and vomiting. CT scan with concern for a left proximal ureteral stricture and dilation of her left renal calyces vs calyceal diverticula, as well as inflamed appearing collecting system and complex-appearing fluid in the calyces. It is possible that she has a chronic obstruction of the left kidney and it's also possible that she has an infection, but her symptoms are very non-specific for pyelonephritis/obstruction. Without a fever, and unstable vitals, it is unclear whether draining her kidney is going to help her clinically. Give her HIV/AIDS status, her normal WBC is hard to draw any conclusions from. Her kidney function is normal. This is potentially a lower urinary tract infection, however without a urine  culture, it is difficult to say for sure.    1. Recommend a catheterized urine culture given prior contaminated samples and possible an ongoing vaginal infection. 2. Treat with empiric antibiotics and monitor clinically. If she improves as expected and remains stable, she will be seen as an outpatient by urology and set up for a retrograde pyelogram when it is clear she does not have a urinary tract infection. 3. If she spikes a fever or her vitals trend in the wrong direction and she becomes unstable, or if she does not improve clinically as expect, left percutaneous nephrotomy tube is the best way to drain her kidney as this will allow subsequent antegrade nephrostogram to delineate whether she has a stricture and the degree of obstruction without an additional anesthetic.  Urology will follow. Thank you for this consult.    Discussed with Dr. Isabel Caprice who agrees. I performed a history and physical examination of the patient and discussed his management with the resident.  I reviewed the resident's note and agree with the documented findings and plan of care.

## 2013-10-01 NOTE — H&P (Signed)
[facility] Admission History and Physical       Kara Allen ZOX:[id number] DOB: Jun 02, 1980 DOA: 09/30/2013  Referring physician:  EDP PCP: Cliffton Asters, MD  Specialists:   Chief Complaint:  Fevers and ABD and Flank Pain  HPI: Kara Allen is a 33 y.o. female with HIV/AIDS last CD4 reported as 130 6 days ago who presents tot he ED with complaint s of worsening lower ABD Pain and Left Flank Pain over the past 24 hours.   She reports that she has had pain x 2 years off and on.   She was evaluated in the ED and a CT scan of the ABD /Pelvis was performed which revealed Left Pyelonephritis and mild hydronephrosis, along with multiple areas which may be cysts or  Abscesses.   Further workup was recommended;.      Review of Systems:  Constitutional: No Weight Loss, No Weight Gain, Night Sweats, Fevers, Chills, Dizziness, Fatigue, or Generalized Weakness HEENT: No Headaches, Difficulty Swallowing,Tooth/Dental Problems,Sore Throat,  No Sneezing, Rhinitis, Ear Ache, Nasal Congestion, or Post Nasal Drip,  Cardio-vascular:  No Chest pain, Orthopnea, PND, Edema in Lower Extremities, Anasarca, Dizziness, Palpitations  Resp: No Dyspnea, No DOE, No Productive Cough, No Non-Productive Cough, No Hemoptysis, No Wheezing.    GI: No Heartburn, Indigestion, +Abdominal Pain, Nausea, Vomiting, Diarrhea, Hematemesis, Hematochezia, Melena, Change in Bowel Habits,  Loss of Appetite  GU: No Dysuria, Change in Color of Urine, No Urgency or Frequency, No Flank pain.  Musculoskeletal: No Joint Pain or Swelling, No Decreased Range of Motion, + Left Flank Pain.  Neurologic: No Syncope, No Seizures, Muscle Weakness, Paresthesia, Vision Disturbance or Loss, No Diplopia, No Vertigo, No Difficulty Walking,  Skin: No Rash or Lesions. Psych: No Change in Mood or Affect, No Depression or Anxiety, No Memory loss, No Confusion, or Hallucinations   Past Medical History  Diagnosis Date  . HIV (human  immunodeficiency virus infection)     Past Surgical History  Procedure Laterality Date  . Cesarean section       Prior to Admission medications   Medication Sig Start Date End Date Taking? Authorizing Provider  abacavir (ZIAGEN) 20 MG/ML solution Take 30 mLs (600 mg total) by mouth daily. 09/02/13  Yes Cliffton Asters, MD  dolutegravir (TIVICAY) 50 MG tablet Take 1 tablet (50 mg total) by mouth daily. 09/02/13  Yes Cliffton Asters, MD  lamiVUDine (EPIVIR) 10 MG/ML solution Take 30 mLs (300 mg total) by mouth daily. 09/02/13  Yes Cliffton Asters, MD  rilpivirine (EDURANT) 25 MG TABS tablet Take 1 tablet (25 mg total) by mouth daily with breakfast. 09/02/13  Yes Cliffton Asters, MD    Allergies  Allergen Reactions  . Latex Other (See Comments)    Irritates bladder  . Sulfamethoxazole-Trimethoprim Other (See Comments)    Irritates bladder  . Tylenol [Acetaminophen] Other (See Comments)    Makes me pee more     Social History:  reports that she has been smoking Cigarettes.  She has been smoking about 0.00 packs per day. She has never used smokeless tobacco. She reports that she uses illicit drugs (Marijuana) about 5 times per week. She reports that she does not drink alcohol.     History reviewed. No pertinent family history.     Physical Exam:  GEN:  Pleasant  Well Nourished and Well Developed  33 y.o. African American female examined and in no acute distress; cooperative with exam Filed Vitals:   09/30/13 1951 09/30/13 2200 09/30/13 2300 10/01/13  0006  BP: 109/68 145/75 103/72 102/64  Pulse:  90 83 83  Temp:    97.9 F (36.6 C)  TempSrc:    Oral  Resp: 14 22 13 16   Height:    5\' 1"  (1.549 m)  Weight:    62.1 kg (136 lb 14.5 oz)  SpO2: 99% 100% 98% 100%   Blood pressure 102/64, pulse 83, temperature 97.9 F (36.6 C), temperature source Oral, resp. rate 16, height 5\' 1"  (1.549 m), weight 62.1 kg (136 lb 14.5 oz), last menstrual period 09/26/2013, SpO2 100.00%. PSYCH: She is alert and  oriented x4; does not appear anxious does not appear depressed; affect is normal HEENT: Normocephalic and Atraumatic, Mucous membranes pink; PERRLA; EOM intact; Fundi:  Benign;  No scleral icterus, Nares: Patent, Oropharynx: Clear, Edentulous or Fair Dentition,    Neck:  FROM, No Cervical Lymphadenopathy nor Thyromegaly or Carotid Bruit; No JVD; Breasts:: Not examined CHEST WALL: No tenderness CHEST: Normal respiration, clear to auscultation bilaterally HEART: Regular rate and rhythm; no murmurs rubs or gallops BACK: No kyphosis or scoliosis; +Left CVA tenderness ABDOMEN: Positive Bowel Sounds, Soft Non-Tender; No Masses, No Organomegaly.   Rectal Exam: Not done EXTREMITIES: No Cyanosis, Clubbing, or Edema; No Ulcerations. Genitalia: not examined PULSES: 2+ and symmetric SKIN: Normal hydration no rash or ulceration CNS:  Alert and Oriented x 4, No Focal Deficits  Vascular: pulses palpable throughout    Labs on Admission:  Basic Metabolic Panel:  Recent Labs Lab 09/30/13 1531  NA 133*  K 3.9  CL 98  CO2 21  GLUCOSE 98  BUN 12  CREATININE 0.60  CALCIUM 9.3   Liver Function Tests:  Recent Labs Lab 09/30/13 1531  AST 15  ALT 6  ALKPHOS 77  BILITOT 0.3  PROT 10.8*  ALBUMIN 2.7*    Recent Labs Lab 09/30/13 1531  LIPASE 27   No results found for this basename: AMMONIA,  in the last 168 hours CBC:  Recent Labs Lab 09/30/13 1531  WBC 5.6  NEUTROABS 3.4  HGB 10.6*  HCT 30.9*  MCV 90.6  PLT 259   Cardiac Enzymes: No results found for this basename: CKTOTAL, CKMB, CKMBINDEX, TROPONINI,  in the last 168 hours  BNP (last 3 results) No results found for this basename: PROBNP,  in the last 8760 hours CBG: No results found for this basename: GLUCAP,  in the last 168 hours  Radiological Exams on Admission: Ct Abdomen Pelvis W Contrast  09/30/2013   CLINICAL DATA:  Mid and lower back and abdominal pain intermittently ; vomiting without diarrhea; on medication  for age by the  EXAM: CT ABDOMEN AND PELVIS WITH CONTRAST  TECHNIQUE: Multidetector CT imaging of the abdomen and pelvis was performed using the standard protocol following bolus administration of intravenous contrast.  CONTRAST:  80mL OMNIPAQUE IOHEXOL 300 MG/ML SOLN intravenously. The patient also received oral contrast material.  COMPARISON:  None.  FINDINGS: The left kidney is enlarged and contains multiple hypodensities which communicate with one-another which exhibit HU IUD of between +2 and +25. There is left mild hydronephrosis and proximal hydroureter without a definite obstructing stone. On the right there is minimal hypodensity in the upper pole. The perinephric fat exhibits normal density bilaterally. The urinary bladder is only partially distended.  The liver contains calcifications consistent with previous granulomatous infection. There is no focal mass or ductal dilation. The gallbladder, spleen, and moderately distended stomach are grossly normal. There is mild ductal prominence throughout the course of the  pancreas without evidence of masses or inflammation. The adrenal glands are normal in size.  There is an enlarged lymph node medial to the left kidney which measures 2.8 x 2.3 x 3.3 cm. There are additional enlarged periaortic and pericaval lymph nodes. The caliber of the abdominal aorta is normal. Evaluation of the small and large bowel is limited in that the contrast has reached only the proximal small bowel. There is no obstructive pattern. One cannot exclude mural thickening especially in the ascending colon.  Within the pelvis there is a left adnexal fluid collection. Which measures 4.6 x 2.7 cm. The uterus is unremarkable. There is no right adnexal mass. There are numerous normal-sized to borderline enlarged pelvic lymph nodes. There is no inguinal nor umbilical hernia.  The lung bases exhibit no acute abnormalities. The lumbar spine and bony pelvis are unremarkable.  IMPRESSION: 1. The left  kidney is enlarged and exhibits multiple low-density areas which communicate with one-another which may reflect cysts, abscesses, or chronic caliectasis. There is also mild left hydronephrosis without obvious etiology. Renal ultrasound may be useful in better characterizing the low-density regions as simple cystic or non simple cystic structures. Thereis a similar appearing finding in the upper pole of the right kidney. There are mildly enlarged periaortic and pericaval and left perirenal lymph nodes. 2. There is no acute hepatobiliary abnormality. There is mild ductal prominence of the otherwise normal-appearing pancreas. 3. No acute bowel abnormality is demonstrated. 4. There is a cystic appearing left adnexal process with HU value of +17. Pelvic ultrasound is recommended to further evaluate this region if the patient is having any symptoms referable to the pelvis.   Electronically Signed   By: David  [place]   On: 09/30/2013 20:56      Assessment/Plan:   33 y.o. female with  Principal Problem:      1.   Sepsis/Pyelonephritis   Urine C+S   IV Rocephin     2.   Hydronephrosis, left   Renal US in AM       3.   Trichomoniasis   Given single dose Rx in ED of Metronidazole 2000 mg x 1      Given 1000 mg of Azithromycin empirically for GC/CMZ infection     4.    Renal Cysts versus Abscesses   Renal US in AM     5.    HIV (human immunodeficiency virus infection)   Continue HAART Rx   Check CD4 level.        6.    DVT Prophylaxis   Lovenox     Code Status:  FULL CODE   Family Communication:    No Family at Bedside Disposition Plan:   Inpatient  Time spent:  560 Minutes  Ron ParkerJENKINS,P[place]yn [place] [facility] Pager [phone]   If 7AM -7PM Please Contact the Day Rounding Team MD for [facility]  If 7PM-7AM, Please Contact Night-Floor Coverage  [email or web address] Password [email or web address] 10/01/2013, 12:22 AM

## 2013-10-01 NOTE — Progress Notes (Signed)
Patient seen and examined. 33 year old lady admitted earlier today, with h/o HIV, for left flank pain and nausea, was found to have mild hydronephrosis on the left side and pyelonephritis. She was started on IV antibiotics and urology consulted .    Kara Modyvijaya Belynda Pagaduan, MD [phone]

## 2013-10-02 LAB — URINE CULTURE
CULTURE: NO GROWTH
Colony Count: NO GROWTH

## 2013-10-02 LAB — BASIC METABOLIC PANEL
Anion gap: 13 (ref 5–15)
BUN: 5 mg/dL — AB (ref 6–23)
CHLORIDE: 99 meq/L (ref 96–112)
CO2: 20 mEq/L (ref 19–32)
Calcium: 8.4 mg/dL (ref 8.4–10.5)
Creatinine, Ser: 0.61 mg/dL (ref 0.50–1.10)
GFR calc non Af Amer: >90 mL/min (ref >90)
GLUCOSE: 87 mg/dL (ref 70–99)
Potassium: 3.7 mEq/L (ref 3.7–5.3)
Sodium: 132 mEq/L — ABNORMAL LOW (ref 137–147)

## 2013-10-02 MED ORDER — IBUPROFEN 800 MG PO TABS
800.0000 mg | ORAL_TABLET | Freq: Once | ORAL | Status: DC
  Filled 2013-10-02: qty 1

## 2013-10-02 NOTE — Progress Notes (Addendum)
[facility] PROGRESS NOTE  [name] ZOX:[id number] DOB: 01/21/1980 DOA: 09/30/2013 PCP: Cliffton Asters, MD Interim summary: Kara Allen is a 33 y.o. female with HIV/AIDS  presents tot he ED with complaint s of worsening lower ABD Pain and Left Flank Pain over the past 24 hours prior to admission. She was found to have pyelonephritis and was started on IV antibiotics. Urology was consulted.  Fever if 100.5 EARLIER TODAY on antibiotics. Urine culutres are pending.   Assessment/Plan: 1. Sepsis/Pyelonephritis  Urine C+S  IV Rocephin  2. Hydronephrosis, left  Mild. Urology consulted.  3. Trichomoniasis  Given single dose Rx in ED of Metronidazole 2000 mg x 1  Given 1000 mg of Azithromycin empirically for GC/CMZ infection  5. HIV (human immunodeficiency virus infection)  Continue HAART Rx  6. DVT Prophylaxis  Lovenox   Code Status: full code.  Family Communication: none at bedside.   Consultants:  urology  Procedures: US RENAL.   Antibiotics:  ROCephin  HPI/Subjective: Pain well controlled.   Objective: Filed Vitals:   10/02/13 1349  BP: 108/79  Pulse: 89  Temp: 98.6 F (37 C)  Resp: 16    Intake/Output Summary (Last 24 hours) at 10/02/13 1713 Last data filed at 10/02/13 1156  Gross per 24 hour  Intake    240 ml  Output    370 ml  Net   -130 ml   Filed Weights   10/01/13 0006  Weight: 62.1 kg (136 lb 14.5 oz)    Exam:   General:  Alert afebrile comfortable  Cardiovascular: s1s2  Respiratory: ctab  Abdomen: soft NT nd bs+  Musculoskeletal: no pedal edema.   Data Reviewed: Basic Metabolic Panel:  Recent Labs Lab 09/30/13 1531 10/01/13 0642 10/02/13 0540  NA 133* 132* 132*  K 3.9 3.5* 3.7  CL 98 102 99  CO2 21 21 20   GLUCOSE 98 97 87  BUN 12 9 5*  CREATININE 0.60 0.55 0.61  CALCIUM 9.3 8.3* 8.4   Liver Function Tests:  Recent Labs Lab 09/30/13 1531  AST 15  ALT 6  ALKPHOS 77  BILITOT 0.3  PROT 10.8*  ALBUMIN  2.7*    Recent Labs Lab 09/30/13 1531  LIPASE 27   No results found for this basename: AMMONIA,  in the last 168 hours CBC:  Recent Labs Lab 09/30/13 1531 10/01/13 0642  WBC 5.6 3.9*  NEUTROABS 3.4  --   HGB 10.6* 8.7*  HCT 30.9* 26.4*  MCV 90.6 91.3  PLT 259 215   Cardiac Enzymes: No results found for this basename: CKTOTAL, CKMB, CKMBINDEX, TROPONINI,  in the last 168 hours BNP (last 3 results) No results found for this basename: PROBNP,  in the last 8760 hours CBG: No results found for this basename: GLUCAP,  in the last 168 hours  Recent Results (from the past 240 hour(s))  WET PREP, GENITAL     Status: Abnormal   Collection Time    09/30/13  9:13 PM      Result Value Ref Range Status   Yeast Wet Prep HPF POC NONE SEEN  NONE SEEN Final   Trich, Wet Prep MANY (*) NONE SEEN Final   Clue Cells Wet Prep HPF POC FEW (*) NONE SEEN Final   WBC, Wet Prep HPF POC TOO NUMEROUS TO COUNT (*) NONE SEEN Final  GC/CHLAMYDIA PROBE AMP     Status: None   Collection Time    09/30/13  9:13 PM      Result  Value Ref Range Status   CT Probe RNA NEGATIVE  NEGATIVE Final   GC Probe RNA NEGATIVE  NEGATIVE Final   Comment: (NOTE)                                                                                               **Normal Reference Range: Negative**          Assay performed using the Gen-Probe APTIMA COMBO2 (R) Assay.     Acceptable specimen types for this assay include APTIMA Swabs (Unisex,     endocervical, urethral, or vaginal), first void urine, and ThinPrep     liquid based cytology samples.     Performed at [facility]  MRSA PCR SCREENING     Status: None   Collection Time    10/01/13 12:07 AM      Result Value Ref Range Status   MRSA by PCR NEGATIVE  NEGATIVE Final   Comment:            The GeneXpert MRSA Assay (FDA     approved for NASAL specimens     only), is one component of a     comprehensive MRSA colonization     surveillance program. It is not      intended to diagnose MRSA     infection nor to guide or     monitor treatment for     MRSA infections.     Studies: Ct Abdomen Pelvis W Contrast  09/30/2013   CLINICAL DATA:  Mid and lower back and abdominal pain intermittently ; vomiting without diarrhea; on medication for age by the  EXAM: CT ABDOMEN AND PELVIS WITH CONTRAST  TECHNIQUE: Multidetector CT imaging of the abdomen and pelvis was performed using the standard protocol following bolus administration of intravenous contrast.  CONTRAST:  80mL OMNIPAQUE IOHEXOL 300 MG/ML SOLN intravenously. The patient also received oral contrast material.  COMPARISON:  None.  FINDINGS: The left kidney is enlarged and contains multiple hypodensities which communicate with one-another which exhibit HU IUD of between +2 and +25. There is left mild hydronephrosis and proximal hydroureter without a definite obstructing stone. On the right there is minimal hypodensity in the upper pole. The perinephric fat exhibits normal density bilaterally. The urinary bladder is only partially distended.  The liver contains calcifications consistent with previous granulomatous infection. There is no focal mass or ductal dilation. The gallbladder, spleen, and moderately distended stomach are grossly normal. There is mild ductal prominence throughout the course of the pancreas without evidence of masses or inflammation. The adrenal glands are normal in size.  There is an enlarged lymph node medial to the left kidney which measures 2.8 x 2.3 x 3.3 cm. There are additional enlarged periaortic and pericaval lymph nodes. The caliber of the abdominal aorta is normal. Evaluation of the small and large bowel is limited in that the contrast has reached only the proximal small bowel. There is no obstructive pattern. One cannot exclude mural thickening especially in the ascending colon.  Within the pelvis there is a left adnexal fluid collection. Which measures 4.6 x 2.7 cm. The uterus  is  unremarkable. There is no right adnexal mass. There are numerous normal-sized to borderline enlarged pelvic lymph nodes. There is no inguinal nor umbilical hernia.  The lung bases exhibit no acute abnormalities. The lumbar spine and bony pelvis are unremarkable.  IMPRESSION: 1. The left kidney is enlarged and exhibits multiple low-density areas which communicate with one-another which may reflect cysts, abscesses, or chronic caliectasis. There is also mild left hydronephrosis without obvious etiology. Renal ultrasound may be useful in better characterizing the low-density regions as simple cystic or non simple cystic structures. Thereis a similar appearing finding in the upper pole of the right kidney. There are mildly enlarged periaortic and pericaval and left perirenal lymph nodes. 2. There is no acute hepatobiliary abnormality. There is mild ductal prominence of the otherwise normal-appearing pancreas. 3. No acute bowel abnormality is demonstrated. 4. There is a cystic appearing left adnexal process with HU value of +17. Pelvic ultrasound is recommended to further evaluate this region if the patient is having any symptoms referable to the pelvis.   Electronically Signed   By: David  [place]   On: 09/30/2013 20:56   US Renal  10/01/2013   CLINICAL DATA:  Followup renal cysts.  EXAM: RENAL/URINARY TRACT ULTRASOUND COMPLETE  COMPARISON:  CT scan 09/30/2013.  FINDINGS: Right Kidney:  Length: 14.4 cm. Normal renal cortical thickness. Mild increased echogenicity (similar to liver). No focal lesions or hydronephrosis. Slightly prominent extra renal pelvis.  Left Kidney:  Length: 14.1 cm. Diffuse increased echogenicity with hydronephrosis and upper left hydroureter. Based on the CT scan there appears to be a an area of strictured narrowing or E UPJ obstruction best seen non the CT scan on image number 45. Also, base of the CT scan the collecting system centrally is not significantly dilated. There is wall thickening of  the collecting system and I suspect there are areas of strictured narrowing with dilated/obstructed the calices and possible cavus seal diverticuli. On the ultrasound the fluid in the dilated collecting system appears complex.  Bladder:  Appears normal for degree of bladder distention.  IMPRESSION: 1. Suspect chronic inflammatory or infectious changes involving the left kidney, mainly based on the prior CT scan. I suspect there are areas of strictured narrowing involving the collecting system with dilated obstructed calices and or possible caliceal diverticulum. 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 2. The right kidney also demonstrates some chronic inflammatory changes but no hydronephrosis or UPJ obstruction. The right ureteral wall appears thickened on the prior CT scan. 3. The retroperitoneal lymphadenopathy seen on the CT scan is probably explained by chronic inflammation or infection of the kidneys. 4. I would recommend a urology consultation. I think the next best test would either be retrograde ureteropyelograms or possibly an MRI abdomen without and with contrast.   Electronically Signed   By: Loralie Champagne M.D.   On: 10/01/2013 09:03    Scheduled Meds: . abacavir  600 mg Oral Q supper  . cefTRIAXone (ROCEPHIN)  IV  1 g Intravenous Q24H  . dolutegravir  50 mg Oral Q supper  . enoxaparin (LOVENOX) injection  40 mg Subcutaneous Q24H  . ibuprofen  800 mg Oral Once  . lamiVUDine  300 mg Oral Q supper  . potassium chloride  40 mEq Oral Once  . rilpivirine  25 mg Oral Q supper   Continuous Infusions: . sodium chloride 100 mL/hr at 10/01/13 2037    Principal Problem:  Pyelonephritis Active Problems:   Trichomoniasis   HIV (human immunodeficiency virus infection)   Hydronephrosis, left   Sepsis    Time spent: 30 minutes    Aurelio Mccamy  [facility] Pager [phone]. If 7PM-7AM, please contact  night-coverage at [email or web address], password [email or web address] 10/02/2013, 5:13 PM  LOS: 2 days

## 2013-10-02 NOTE — Progress Notes (Signed)
Blood noted in patients urine. MD Blake DivineAkula notified via text page

## 2013-10-02 NOTE — Progress Notes (Signed)
Patient stated " I feel warm, would you mind checking my temperature?" Temp 100.5, notified provider on call. Made him aware of pt Tylenol allergy. Provider ordered one time dose of ibuprofen. Pt states to RN "I have an allergy to ibuprofen, it makes me itch all over and break into hives and benadryl never helped." Pt states "I'll just wait to see if it comes down. Can you recheck it before you go?" Made changes to patient's room temperature. Notified provider on call. Will continue to monitor patient.

## 2013-10-02 NOTE — Progress Notes (Signed)
Patient ID: Kara Allen, female   DOB: 08/05/1980, 33 y.o.   MRN: [id number]   Subjective: Patient reports feeling better. She has no CVA or abdominal pain at this time. Earlier today she was having some bladder pressure but that has resolved. She is interested in going home  Objective: Vital signs in last 24 hours: Temp:  [98.2 F (36.8 C)-100.5 F (38.1 C)] 98.6 F (37 C) (10/02 1349) Pulse Rate:  [82-91] 89 (10/02 1349) Resp:  [16] 16 (10/02 1349) BP: (100-108)/(59-79) 108/79 mmHg (10/02 1349) SpO2:  [98 %-100 %] 100 % (10/02 1349)  Intake/Output from previous day: 10/01 0701 - 10/02 0700 In: 788.3 [P.O.:120; I.V.:668.3] Out: 20 [Urine:20] Intake/Output this shift: Total I/O In: 120 [P.O.:120] Out: 350 [Urine:350]  Physical Exam:  Constitutional: Vital signs reviewed. WD WN in NAD   Eyes: PERRL, No scleral icterus.   Cardiovascular: RRR Pulmonary/Chest: Normal effort Abdominal: Soft. Non-tender, non-distended, bowel sounds are normal, no masses, organomegaly, or guarding present. No CVA tenderness Genitourinary: Not examined Extremities: No cyanosis or edema   Lab Results:  Recent Labs  09/30/13 1531 10/01/13 0642  HGB 10.6* 8.7*  HCT 30.9* 26.4*   BMET  Recent Labs  10/01/13 0642 10/02/13 0540  NA 132* 132*  K 3.5* 3.7  CL 102 99  CO2 21 20  GLUCOSE 97 87  BUN 9 5*  CREATININE 0.55 0.61  CALCIUM 8.3* 8.4   No results found for this basename: LABPT, INR,  in the last 72 hours No results found for this basename: LABURIN,  in the last 72 hours Results for orders placed during the hospital encounter of 09/30/13  WET PREP, GENITAL     Status: Abnormal   Collection Time    09/30/13  9:13 PM      Result Value Ref Range Status   Yeast Wet Prep HPF POC NONE SEEN  NONE SEEN Final   Trich, Wet Prep MANY (*) NONE SEEN Final   Clue Cells Wet Prep HPF POC FEW (*) NONE SEEN Final   WBC, Wet Prep HPF POC TOO NUMEROUS TO COUNT (*) NONE SEEN Final   GC/CHLAMYDIA PROBE AMP     Status: None   Collection Time    09/30/13  9:13 PM      Result Value Ref Range Status   CT Probe RNA NEGATIVE  NEGATIVE Final   GC Probe RNA NEGATIVE  NEGATIVE Final   Comment: (NOTE)                                                                                               **Normal Reference Range: Negative**          Assay performed using the Gen-Probe APTIMA COMBO2 (R) Assay.     Acceptable specimen types for this assay include APTIMA Swabs (Unisex,     endocervical, urethral, or vaginal), first void urine, and ThinPrep     liquid based cytology samples.     Performed at [facility]  MRSA PCR SCREENING     Status: None   Collection Time    10/01/13 12:07 AM  Result Value Ref Range Status   MRSA by PCR NEGATIVE  NEGATIVE Final   Comment:            The GeneXpert MRSA Assay (FDA     approved for NASAL specimens     only), is one component of a     comprehensive MRSA colonization     surveillance program. It is not     intended to diagnose MRSA     infection nor to guide or     monitor treatment for     MRSA infections.    Studies/Results: Ct Abdomen Pelvis W Contrast  09/30/2013   CLINICAL DATA:  Mid and lower back and abdominal pain intermittently ; vomiting without diarrhea; on medication for age by the  EXAM: CT ABDOMEN AND PELVIS WITH CONTRAST  TECHNIQUE: Multidetector CT imaging of the abdomen and pelvis was performed using the standard protocol following bolus administration of intravenous contrast.  CONTRAST:  80mL OMNIPAQUE IOHEXOL 300 MG/ML SOLN intravenously. The patient also received oral contrast material.  COMPARISON:  None.  FINDINGS: The left kidney is enlarged and contains multiple hypodensities which communicate with one-another which exhibit HU IUD of between +2 and +25. There is left mild hydronephrosis and proximal hydroureter without a definite obstructing stone. On the right there is minimal hypodensity in the upper  pole. The perinephric fat exhibits normal density bilaterally. The urinary bladder is only partially distended.  The liver contains calcifications consistent with previous granulomatous infection. There is no focal mass or ductal dilation. The gallbladder, spleen, and moderately distended stomach are grossly normal. There is mild ductal prominence throughout the course of the pancreas without evidence of masses or inflammation. The adrenal glands are normal in size.  There is an enlarged lymph node medial to the left kidney which measures 2.8 x 2.3 x 3.3 cm. There are additional enlarged periaortic and pericaval lymph nodes. The caliber of the abdominal aorta is normal. Evaluation of the small and large bowel is limited in that the contrast has reached only the proximal small bowel. There is no obstructive pattern. One cannot exclude mural thickening especially in the ascending colon.  Within the pelvis there is a left adnexal fluid collection. Which measures 4.6 x 2.7 cm. The uterus is unremarkable. There is no right adnexal mass. There are numerous normal-sized to borderline enlarged pelvic lymph nodes. There is no inguinal nor umbilical hernia.  The lung bases exhibit no acute abnormalities. The lumbar spine and bony pelvis are unremarkable.  IMPRESSION: 1. The left kidney is enlarged and exhibits multiple low-density areas which communicate with one-another which may reflect cysts, abscesses, or chronic caliectasis. There is also mild left hydronephrosis without obvious etiology. Renal ultrasound may be useful in better characterizing the low-density regions as simple cystic or non simple cystic structures. Thereis a similar appearing finding in the upper pole of the right kidney. There are mildly enlarged periaortic and pericaval and left perirenal lymph nodes. 2. There is no acute hepatobiliary abnormality. There is mild ductal prominence of the otherwise normal-appearing pancreas. 3. No acute bowel  abnormality is demonstrated. 4. There is a cystic appearing left adnexal process with HU value of +17. Pelvic ultrasound is recommended to further evaluate this region if the patient is having any symptoms referable to the pelvis.   Electronically Signed   By: Ronit Cranfield  [place]   On: 09/30/2013 20:56   US Renal  10/01/2013   CLINICAL DATA:  Followup renal cysts.  EXAM: RENAL/URINARY TRACT ULTRASOUND  COMPLETE  COMPARISON:  CT scan 09/30/2013.  FINDINGS: Right Kidney:  Length: 14.4 cm. Normal renal cortical thickness. Mild increased echogenicity (similar to liver). No focal lesions or hydronephrosis. Slightly prominent extra renal pelvis.  Left Kidney:  Length: 14.1 cm. Diffuse increased echogenicity with hydronephrosis and upper left hydroureter. Based on the CT scan there appears to be a an area of strictured narrowing or E UPJ obstruction best seen non the CT scan on image number 45. Also, base of the CT scan the collecting system centrally is not significantly dilated. There is wall thickening of the collecting system and I suspect there are areas of strictured narrowing with dilated/obstructed the calices and possible cavus seal diverticuli. On the ultrasound the fluid in the dilated collecting system appears complex.  Bladder:  Appears normal for degree of bladder distention.  IMPRESSION: 1. Suspect chronic inflammatory or infectious changes involving the left kidney, mainly based on the prior CT scan. I suspect there are areas of strictured narrowing involving the collecting system with dilated obstructed calices and or possible caliceal diverticulum. 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 2. The right kidney also demonstrates some chronic inflammatory changes but no hydronephrosis or UPJ obstruction. The right ureteral wall appears thickened on the prior CT scan. 3. The retroperitoneal lymphadenopathy seen on the CT scan is probably  explained by chronic inflammation or infection of the kidneys. 4. I would recommend a urology consultation. I think the next best test would either be retrograde ureteropyelograms or possibly an MRI abdomen without and with contrast.   Electronically Signed   By: Loralie ChampagneMark  Gallerani M.D.   On: 10/01/2013 09:03    Assessment/Plan:   Miss Ure presented with nonspecific complaints not necessarily consistent with pyelonephritis. She tells me her pain was in her lower back and more on the right side than the left. I am not convinced that she's had a true bacterial pyelonephritis. She clearly does have some hydronephrosis of the left kidney but requires no urgent drainage in my opinion. I did discuss with her the importance of outpatient followup. Will plan on either repeat CT imaging with IV contrast and delayed images or potentially cystoscopy with retrograde pyelography. We will probably start her assessment with a renal ultrasound to gauge the degree of hydronephrosis. I suspect her urine culture will be negative. She is clearly nontoxic and afebrile at this time. She may indeed be okay to sent home on oral antibiotics.   LOS: 2 days   Marchelle Rinella S 10/02/2013, 5:30 PM

## 2013-10-02 NOTE — Progress Notes (Signed)
Pt states that she has been voiding this AM but still feels like she needs to void. Patient states that she has difficulty starting stream, feels pressure in abdomen and "feels like I have to pee, but can't" Bladder scanned patient with 0 ml volume. 200 ml emptied prior to bladder scan

## 2013-10-02 NOTE — Care Management Note (Signed)
    Page 1 of 1   10/02/2013     4:39:49 PM CARE MANAGEMENT NOTE 10/02/2013  Patient:  Kara Allen,Kara Allen   Account Number:  [id number]  Date Initiated:  10/02/2013  Documentation initiated by:  Letha CapeAYLOR,Masin Shatto  Subjective/Objective Assessment:   dx pyeloneprhiitis  admit-from home     Action/Plan:   Anticipated DC Date:  10/03/2013   Anticipated DC Plan:  HOME/SELF CARE      DC Planning Services  CM consult      Choice offered to / List presented to:             Status of service:  In process, will continue to follow Medicare Important Message given?  NO (If response is "NO", the following Medicare IM given date fields will be blank) Date Medicare IM given:   Medicare IM given by:   Date Additional Medicare IM given:   Additional Medicare IM given by:    Discharge Disposition:    Per UR Regulation:  Reviewed for med. necessity/level of care/duration of stay  If discussed at Long Length of Stay Meetings, dates discussed:    Comments:

## 2013-10-03 DIAGNOSIS — N3000 Acute cystitis without hematuria: Secondary | ICD-10-CM

## 2013-10-03 MED ORDER — LEVOFLOXACIN 500 MG PO TABS: 500.0000 mg | ORAL_TABLET | Freq: Every day | ORAL | Status: DC

## 2013-10-03 MED ORDER — OXYCODONE HCL 5 MG PO TABS: 5.0000 mg | ORAL_TABLET | ORAL | Status: DC | PRN

## 2013-10-03 NOTE — Progress Notes (Signed)
NURSING PROGRESS NOTE  Kara PresserSharon D Kuzel [id number] Discharge Data: 10/03/2013 2:29 PM Attending Provider: Kathlen ModyVijaya Akula, MD NWG:NFAOPCP:John Orvan Falconerampbell, MD     Kara PresserSharon D Sippel to be D/C'd Home per MD order.  Discussed with the patient the After Visit Summary and all questions fully answered. All IV's discontinued with no bleeding noted. All belongings returned to patient for patient to take home.   Last Vital Signs:  Blood pressure 108/72, pulse 66, temperature 98.3 F (36.8 C), temperature source Oral, resp. rate 18, height 5\' 1"  (1.549 m), weight 62.1 kg (136 lb 14.5 oz), last menstrual period 09/26/2013, SpO2 100.00%.  Discharge Medication List   Medication List         abacavir 20 MG/ML solution  Commonly known as:  ZIAGEN  Take 30 mLs (600 mg total) by mouth daily.     dolutegravir 50 MG tablet  Commonly known as:  TIVICAY  Take 1 tablet (50 mg total) by mouth daily.     lamiVUDine 10 MG/ML solution  Commonly known as:  EPIVIR  Take 30 mLs (300 mg total) by mouth daily.     levofloxacin 500 MG tablet  Commonly known as:  LEVAQUIN  Take 1 tablet (500 mg total) by mouth daily.     oxyCODONE 5 MG immediate release tablet  Commonly known as:  Oxy IR/ROXICODONE  Take 1 tablet (5 mg total) by mouth every 4 (four) hours as needed for moderate pain.     rilpivirine 25 MG Tabs tablet  Commonly known as:  EDURANT  Take 1 tablet (25 mg total) by mouth daily with breakfast.         Cathlyn Parsonsattha Nataley Bahri, RN

## 2013-10-03 NOTE — Progress Notes (Signed)
Patient refused to be escorted downstairs after d/c

## 2013-10-03 NOTE — Progress Notes (Addendum)
Patient ID: Kara Allen, female   DOB: 1980/10/11, 33 y.o.   MRN: [id number]   Subjective: Doing well. Complaints of ongoing pain on the right side, but no complaint of left sided pain. Urine culture negative. No fevers, chills, nausea/vomiting. Bladder urgency after catheter, but this has subsided.  Objective: Vital signs in last 24 hours: Temp:  [97.9 F (36.6 C)-99 F (37.2 C)] 98.3 F (36.8 C) (10/03 1019) Pulse Rate:  [60-89] 64 (10/03 1019) Resp:  [16-20] 18 (10/03 1019) BP: (100-113)/(62-79) 113/71 mmHg (10/03 1019) SpO2:  [98 %-100 %] 100 % (10/03 1019)  Intake/Output from previous day: 10/02 0701 - 10/03 0700 In: 780 [P.O.:780] Out: 800 [Urine:800] Intake/Output this shift: Total I/O In: 220 [P.O.:220] Out: -   Physical Exam:  Constitutional: Vital signs reviewed. WD WN in NAD   Eyes: PERRL, No scleral icterus.   Cardiovascular: RRR Pulmonary/Chest: Normal effort Abdominal: Soft. Non-tender, non-distended, bowel sounds are normal, no masses, organomegaly, or guarding present. No CVA tenderness Genitourinary: Not examined Extremities: No cyanosis or edema   Lab Results:  Recent Labs  09/30/13 1531 10/01/13 0642  HGB 10.6* 8.7*  HCT 30.9* 26.4*   BMET  Recent Labs  10/01/13 0642 10/02/13 0540  NA 132* 132*  K 3.5* 3.7  CL 102 99  CO2 21 20  GLUCOSE 97 87  BUN 9 5*  CREATININE 0.55 0.61  CALCIUM 8.3* 8.4   No results found for this basename: LABPT, INR,  in the last 72 hours No results found for this basename: LABURIN,  in the last 72 hours Results for orders placed during the hospital encounter of 09/30/13  WET PREP, GENITAL     Status: Abnormal   Collection Time    09/30/13  9:13 PM      Result Value Ref Range Status   Yeast Wet Prep HPF POC NONE SEEN  NONE SEEN Final   Trich, Wet Prep MANY (*) NONE SEEN Final   Clue Cells Wet Prep HPF POC FEW (*) NONE SEEN Final   WBC, Wet Prep HPF POC TOO NUMEROUS TO COUNT (*) NONE SEEN Final   GC/CHLAMYDIA PROBE AMP     Status: None   Collection Time    09/30/13  9:13 PM      Result Value Ref Range Status   CT Probe RNA NEGATIVE  NEGATIVE Final   GC Probe RNA NEGATIVE  NEGATIVE Final   Comment: (NOTE)                                                                                               **Normal Reference Range: Negative**          Assay performed using the Gen-Probe APTIMA COMBO2 (R) Assay.     Acceptable specimen types for this assay include APTIMA Swabs (Unisex,     endocervical, urethral, or vaginal), first void urine, and ThinPrep     liquid based cytology samples.     Performed at [facility]  MRSA PCR SCREENING     Status: None   Collection Time    10/01/13 12:07 AM  Result Value Ref Range Status   MRSA by PCR NEGATIVE  NEGATIVE Final   Comment:            The GeneXpert MRSA Assay (FDA     approved for NASAL specimens     only), is one component of a     comprehensive MRSA colonization     surveillance program. It is not     intended to diagnose MRSA     infection nor to guide or     monitor treatment for     MRSA infections.  URINE CULTURE     Status: None   Collection Time    10/01/13  5:00 PM      Result Value Ref Range Status   Specimen Description URINE, CATHETERIZED   Final   Special Requests Immunocompromised   Final   Culture  Setup Time     Final   Value: 10/02/2013 01:45     [facility]   Colony Count     Final   Value: NO GROWTH     [facility]   Culture     Final   Value: NO GROWTH     [facility]formed at [facility]   Report Status 10/02/2013 FINAL   Final    Studies/Results: No results found.  Assessment/Plan:  Kara Allen presented with nonspecific complaints not necessarily consistent with pyelonephritis. She tells me her pain was in her lower back and more on the right side than the left. I am not convinced that she's had a true bacterial pyelonephritis. She  clearly does have some hydronephrosis of the left kidney but requires no urgent drainage in my opinion. I did discuss with her the importance of outpatient followup. Will plan on either repeat CT imaging with IV contrast and delayed images or potentially cystoscopy with retrograde pyelography. We will probably start her assessment with a renal ultrasound to gauge the degree of hydronephrosis. Urine culture was negative.  She is clearly nontoxic and afebrile at this time and appropriate for discharge from urology standpoint.   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     LOS: 3 days   JOHNSON, DAVID C 10/03/2013, 11:20 AM   Attending attestation: patient was personally seen and examined. I discussed patient with Dr. Laural BenesJohnson. I agree with above history, physical, assessment and plan.

## 2013-10-03 NOTE — Discharge Summary (Addendum)
Physician Discharge Summary  Kara Allen ZOX:[id number] DOB: Jun 27, 1980 DOA: 09/30/2013  PCP: Cliffton Asters, MD  Admit date: 09/30/2013 Discharge date: 10/03/2013  Time spent: 30 minutes  Recommendations for Outpatient Follow-up:  1. Follow up with CBC  In 1 to 2 weeks to check hemoglobin.  2. Follow up with urology as recommended.   Discharge Diagnoses:  Principal Problem:   Pyelonephritis Active Problems:   Trichomoniasis   HIV (human immunodeficiency virus infection)   Hydronephrosis, left   Sepsis   Discharge Condition: improved.   Diet recommendation: regular.  Filed Weights   10/01/13 0006  Weight: 62.1 kg (136 lb 14.5 oz)    History of present illness:   Kara Allen is a 33 y.o. female with HIV/AIDS presents tot he ED with complaint s of worsening lower ABD Pain and Left Flank Pain over the past 24 hours prior to admission. She was found to have pyelonephritis and was started on IV antibiotics. Urology was consulted. Fever if 100.5 EARLIER TODAY on antibiotics. Urine culutres are negative. Appreciate urology recommendations.   Hospital Course:  Pyelonephritis: Started on IV antibiotics and urine cultures obtained. Urology consulted for left hydronephrosis and recommendations given to follow up as outpatient for repeat CT imaging with IV contrast and delayed images or potentially cystoscopy with retrograde pyelography.  She will be discharged home on oral antibiotics to complete the course.    Trichomoniasis: She received a dose of flagyl and azithromycin.   HIV: Continue HAART .   ANEMIA: hemoglobin dropped more than 1gm. She reported that she is menstruating. Recommend outpatient follow up with a CBC to check hemoglobin and follow up. She did not want to stay for more blood draws today and wanted to leave as soon as possible.    Procedures:  US RENAL  CT ABD AND PELVIS.   Consultations:  urology  Discharge Exam: Filed Vitals:   10/03/13  1307  BP: 108/72  Pulse: 66  Temp: 98.3 F (36.8 C)  Resp: 18    General: alert afebrile comfortable Cardiovascular: s1s2 Respiratory: ctab  Discharge Instructions You were cared for by a hospitalist during your hospital stay. If you have any questions about your discharge medications or the care you received while you were in the hospital after you are discharged, you can call the unit and asked to speak with the hospitalist on call if the hospitalist that took care of you is not available. Once you are discharged, your primary care physician will handle any further medical issues. Please note that NO REFILLS for any discharge medications will be authorized once you are discharged, as it is imperative that you return to your primary care physician (or establish a relationship with a primary care physician if you do not have one) for your aftercare needs so that they can reassess your need for medications and monitor your lab values.  Discharge Instructions   Discharge instructions    Complete by:  As directed   Follow up with urology as recommended          Current Discharge Medication List    START taking these medications   Details  levofloxacin (LEVAQUIN) 500 MG tablet Take 1 tablet (500 mg total) by mouth daily. Qty: 5 tablet, Refills: 0    oxyCODONE (OXY IR/ROXICODONE) 5 MG immediate release tablet Take 1 tablet (5 mg total) by mouth every 4 (four) hours as needed for moderate pain. Qty: 15 tablet, Refills: 0      CONTINUE these  medications which have NOT CHANGED   Details  abacavir (ZIAGEN) 20 MG/ML solution Take 30 mLs (600 mg total) by mouth daily. Qty: 960 mL, Refills: 12   Associated Diagnoses: Human immunodeficiency virus (HIV) disease    dolutegravir (TIVICAY) 50 MG tablet Take 1 tablet (50 mg total) by mouth daily. Qty: 30 tablet, Refills: 12   Associated Diagnoses: Human immunodeficiency virus (HIV) disease    lamiVUDine (EPIVIR) 10 MG/ML solution Take 30 mLs  (300 mg total) by mouth daily. Qty: 960 mL, Refills: 12   Associated Diagnoses: Human immunodeficiency virus (HIV) disease    rilpivirine (EDURANT) 25 MG TABS tablet Take 1 tablet (25 mg total) by mouth daily with breakfast. Qty: 30 tablet, Refills: 12   Associated Diagnoses: Human immunodeficiency virus (HIV) disease       Allergies  Allergen Reactions  . Latex Other (See Comments)    Irritates bladder  . Sulfamethoxazole-Trimethoprim Other (See Comments)    Irritates bladder  . Tylenol [Acetaminophen] Other (See Comments)    Makes me pee more   Follow-up Information   Follow up with [facility] S, MD In 2 weeks.   Specialty:  Urology   Contact information:   [address] [place] [address] [phone]       Follow up with Cliffton Asters, MD. Schedule an appointment as soon as possible for a visit in 2 weeks.   Specialty:  Infectious Diseases   Contact information:   [address] [place] [address] [phone]        The results of significant diagnostics from this hospitalization (including imaging, microbiology, ancillary and laboratory) are listed below for reference.    Significant Diagnostic Studies: Ct Abdomen Pelvis W Contrast  09/30/2013   CLINICAL DATA:  Mid and lower back and abdominal pain intermittently ; vomiting without diarrhea; on medication for age by the  EXAM: CT ABDOMEN AND PELVIS WITH CONTRAST  TECHNIQUE: Multidetector CT imaging of the abdomen and pelvis was performed using the standard protocol following bolus administration of intravenous contrast.  CONTRAST:  80mL OMNIPAQUE IOHEXOL 300 MG/ML SOLN intravenously. The patient also received oral contrast material.  COMPARISON:  None.  FINDINGS: The left kidney is enlarged and contains multiple hypodensities which communicate with one-another which exhibit HU IUD of between +2 and +25. There is left mild hydronephrosis and proximal hydroureter without a definite obstructing  stone. On the right there is minimal hypodensity in the upper pole. The perinephric fat exhibits normal density bilaterally. The urinary bladder is only partially distended.  The liver contains calcifications consistent with previous granulomatous infection. There is no focal mass or ductal dilation. The gallbladder, spleen, and moderately distended stomach are grossly normal. There is mild ductal prominence throughout the course of the pancreas without evidence of masses or inflammation. The adrenal glands are normal in size.  There is an enlarged lymph node medial to the left kidney which measures 2.8 x 2.3 x 3.3 cm. There are additional enlarged periaortic and pericaval lymph nodes. The caliber of the abdominal aorta is normal. Evaluation of the small and large bowel is limited in that the contrast has reached only the proximal small bowel. There is no obstructive pattern. One cannot exclude mural thickening especially in the ascending colon.  Within the pelvis there is a left adnexal fluid collection. Which measures 4.6 x 2.7 cm. The uterus is unremarkable. There is no right adnexal mass. There are numerous normal-sized to borderline enlarged pelvic lymph nodes. There is  no inguinal nor umbilical hernia.  The lung bases exhibit no acute abnormalities. The lumbar spine and bony pelvis are unremarkable.  IMPRESSION: 1. The left kidney is enlarged and exhibits multiple low-density areas which communicate with one-another which may reflect cysts, abscesses, or chronic caliectasis. There is also mild left hydronephrosis without obvious etiology. Renal ultrasound may be useful in better characterizing the low-density regions as simple cystic or non simple cystic structures. Thereis a similar appearing finding in the upper pole of the right kidney. There are mildly enlarged periaortic and pericaval and left perirenal lymph nodes. 2. There is no acute hepatobiliary abnormality. There is mild ductal prominence of the  otherwise normal-appearing pancreas. 3. No acute bowel abnormality is demonstrated. 4. There is a cystic appearing left adnexal process with HU value of +17. Pelvic ultrasound is recommended to further evaluate this region if the patient is having any symptoms referable to the pelvis.   Electronically Signed   By: David  SwazilandJordan   On: 09/30/2013 20:56   [place] [facility]  10/01/2013   CLINICAL DATA:  Followup renal cysts.  EXAM: RENAL/URINARY TRACT ULTRASOUND COMPLETE  COMPARISON:  CT scan 09/30/2013.  FINDINGS: Right Kidney:  Length: 14.4 cm. Normal renal cortical thickness. Mild increased echogenicity (similar to liver). No focal lesions or hydronephrosis. Slightly prominent extra renal pelvis.  Left Kidney:  Length: 14.1 cm. Diffuse increased echogenicity with hydronephrosis and upper left hydroureter. Based on the CT scan there appears to be a an area of strictured narrowing or E UPJ obstruction best seen non the CT scan on image number 45. Also, base of the CT scan the collecting system centrally is not significantly dilated. There is wall thickening of the collecting system and I suspect there are areas of strictured narrowing with dilated/obstructed the calices and possible cavus seal diverticuli. On the ultrasound the fluid in the dilated collecting system appears complex.  Bladder:  Appears normal for degree of bladder distention.  IMPRESSION: 1. Suspect chronic inflammatory or infectious changes involving the left kidney, mainly based on the prior CT scan. I suspect there are areas of strictured narrowing involving the collecting system with dilated obstructed calices and or possible caliceal diverticulum. 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 2. The right kidney also demonstrates some chronic inflammatory changes but no hydronephrosis or UPJ obstruction. The right ureteral wall appears thickened on the prior CT scan. 3. The retroperitoneal  lymphadenopathy seen on the CT scan is probably explained by chronic inflammation or infection of the kidneys. 4. I would recommend a urology consultation. I think the next best test would either be retrograde ureteropyelograms or possibly an MRI abdomen without and with contrast.   Electronically Signed   By: Loralie ChampagneMark  Gallerani M.D.   On: 10/01/2013 09:03    Microbiology: Recent Results (from the past 240 hour(s))  WET PREP, GENITAL     Status: Abnormal   Collection Time    09/30/13  9:13 PM      Result Value Ref Range Status   Yeast Wet Prep HPF POC NONE SEEN  NONE SEEN Final   Trich, Wet Prep MANY (*) NONE SEEN Final   Clue Cells Wet Prep HPF POC FEW (*) NONE SEEN Final   WBC, Wet Prep HPF POC TOO NUMEROUS TO COUNT (*) NONE SEEN Final  GC/CHLAMYDIA PROBE AMP     Status: None   Collection Time    09/30/13  9:13 PM  Result Value Ref Range Status   CT Probe RNA NEGATIVE  NEGATIVE Final   GC Probe RNA NEGATIVE  NEGATIVE Final   Comment: (NOTE)                                                                                               **Normal Reference Range: Negative**          Assay performed using the Gen-Probe APTIMA COMBO2 (R) Assay.     Acceptable specimen types for this assay include APTIMA Swabs (Unisex,     endocervical, urethral, or vaginal), first void urine, and ThinPrep     liquid based cytology samples.     Performed at [facility]  MRSA PCR SCREENING     Status: None   Collection Time    10/01/13 12:07 AM      Result Value Ref Range Status   MRSA by PCR NEGATIVE  NEGATIVE Final   Comment:            The GeneXpert MRSA Assay (FDA     approved for NASAL specimens     only), is one component of a     comprehensive MRSA colonization     surveillance program. It is not     intended to diagnose MRSA     infection nor to guide or     monitor treatment for     MRSA infections.  URINE CULTURE     Status: None   Collection Time    10/01/13  5:00 PM       Result Value Ref Range Status   Specimen Description URINE, CATHETERIZED   Final   Special Requests Immunocompromised   Final   Culture  Setup Time     Final   Value: 10/02/2013 01:45     Performed at [facility]   Colony Count     Final   Value: NO GROWTH     Performed at [facility]   Culture     Final   Value: NO GROWTH     Performed at [facility]   Report Status 10/02/2013 FINAL   Final     Labs: Basic Metabolic Panel:  Recent Labs Lab 09/30/13 1531 10/01/13 0642 10/02/13 0540  NA 133* 132* 132*  K 3.9 3.5* 3.7  CL 98 102 99  CO2 21 21 20   GLUCOSE 98 97 87  BUN 12 9 5*  CREATININE 0.60 0.55 0.61  CALCIUM 9.3 8.3* 8.4   Liver Function Tests:  Recent Labs Lab 09/30/13 1531  AST 15  ALT 6  ALKPHOS 77  BILITOT 0.3  PROT 10.8*  ALBUMIN 2.7*    Recent Labs Lab 09/30/13 1531  LIPASE 27   No results found for this basename: AMMONIA,  in the last 168 hours CBC:  Recent Labs Lab 09/30/13 1531 10/01/13 0642  WBC 5.6 3.9*  NEUTROABS 3.4  --   HGB 10.6* 8.7*  HCT 30.9* 26.4*  MCV 90.6 91.3  PLT 259 215   Cardiac Enzymes: No results found for this basename: CKTOTAL, CKMB, CKMBINDEX, TROPONINI,  in  the last 168 hours BNP: BNP (last 3 results) No results found for this basename: PROBNP,  in the last 8760 hours CBG: No results found for this basename: GLUCAP,  in the last 168 hours     Signed:  Philipe Laswell  [facility] 10/03/2013, 2:33 PM

## 2013-10-06 ENCOUNTER — Encounter: Payer: Self-pay | Admitting: Obstetrics & Gynecology

## 2013-10-12 ENCOUNTER — Telehealth: Payer: Self-pay | Admitting: *Deleted

## 2013-10-12 ENCOUNTER — Encounter (HOSPITAL_COMMUNITY): Payer: Self-pay | Admitting: *Deleted

## 2013-10-12 ENCOUNTER — Inpatient Hospital Stay (HOSPITAL_COMMUNITY): Payer: Medicaid Other

## 2013-10-12 ENCOUNTER — Inpatient Hospital Stay (HOSPITAL_COMMUNITY)
Admission: AD | Admit: 2013-10-12 | Discharge: 2013-10-12 | Disposition: A | Discharge status: Home / Self Care | Payer: Medicaid Other | Source: Ambulatory Visit | Attending: Family Medicine | Admitting: Family Medicine

## 2013-10-12 DIAGNOSIS — R1030 Lower abdominal pain, unspecified: Secondary | ICD-10-CM | POA: Diagnosis not present

## 2013-10-12 DIAGNOSIS — F17210 Nicotine dependence, cigarettes, uncomplicated: Secondary | ICD-10-CM | POA: Insufficient documentation

## 2013-10-12 DIAGNOSIS — N839 Noninflammatory disorder of ovary, fallopian tube and broad ligament, unspecified: Secondary | ICD-10-CM | POA: Diagnosis not present

## 2013-10-12 DIAGNOSIS — Z21 Asymptomatic human immunodeficiency virus [HIV] infection status: Secondary | ICD-10-CM | POA: Diagnosis not present

## 2013-10-12 DIAGNOSIS — N83202 Unspecified ovarian cyst, left side: Secondary | ICD-10-CM

## 2013-10-12 DIAGNOSIS — N159 Renal tubulo-interstitial disease, unspecified: Secondary | ICD-10-CM | POA: Diagnosis present

## 2013-10-12 DIAGNOSIS — N949 Unspecified condition associated with female genital organs and menstrual cycle: Secondary | ICD-10-CM | POA: Insufficient documentation

## 2013-10-12 HISTORY — DX: Carbuncle, unspecified: L02.93

## 2013-10-12 HISTORY — DX: Tubulo-interstitial nephritis, not specified as acute or chronic: N12

## 2013-10-12 HISTORY — DX: Trichomoniasis, unspecified: A59.9

## 2013-10-12 HISTORY — DX: Furuncle, unspecified: L02.92

## 2013-10-12 LAB — CBC WITH DIFFERENTIAL/PLATELET
BASOS ABS: 0 10*3/uL (ref 0.0–0.1)
Basophils Relative: 1 % (ref 0–1)
EOS PCT: 2 % (ref 0–5)
Eosinophils Absolute: 0.1 10*3/uL (ref 0.0–0.7)
HCT: 32.6 % — ABNORMAL LOW (ref 36.0–46.0)
Hemoglobin: 10.6 g/dL — ABNORMAL LOW (ref 12.0–15.0)
LYMPHS ABS: 1.5 10*3/uL (ref 0.7–4.0)
Lymphocytes Relative: 46 % (ref 12–46)
MCH: 30.3 pg (ref 26.0–34.0)
MCHC: 32.5 g/dL (ref 30.0–36.0)
MCV: 93.1 fL (ref 78.0–100.0)
Monocytes Absolute: 0.3 10*3/uL (ref 0.1–1.0)
Monocytes Relative: 9 % (ref 3–12)
NEUTROS PCT: 42 % — AB (ref 43–77)
Neutro Abs: 1.4 10*3/uL — ABNORMAL LOW (ref 1.7–7.7)
PLATELETS: 218 10*3/uL (ref 150–400)
RBC: 3.5 MIL/uL — AB (ref 3.87–5.11)
RDW: 16.1 % — AB (ref 11.5–15.5)
WBC: 3.3 10*3/uL — AB (ref 4.0–10.5)

## 2013-10-12 LAB — URINE MICROSCOPIC-ADD ON

## 2013-10-12 LAB — URINALYSIS, ROUTINE W REFLEX MICROSCOPIC
Bilirubin Urine: NEGATIVE
Glucose, UA: NEGATIVE mg/dL
Ketones, ur: NEGATIVE mg/dL
Nitrite: NEGATIVE
Protein, ur: 30 mg/dL — AB
SPECIFIC GRAVITY, URINE: 1.02 (ref 1.005–1.030)
UROBILINOGEN UA: 0.2 mg/dL (ref 0.0–1.0)
pH: 6 (ref 5.0–8.0)

## 2013-10-12 LAB — WET PREP, GENITAL
TRICH WET PREP: NONE SEEN
Yeast Wet Prep HPF POC: NONE SEEN

## 2013-10-12 LAB — POCT PREGNANCY, URINE: PREG TEST UR: NEGATIVE

## 2013-10-12 MED ORDER — DOXYCYCLINE HYCLATE 100 MG PO CAPS: 100.0000 mg | ORAL_CAPSULE | Freq: Two times a day (BID) | ORAL | Status: DC

## 2013-10-12 MED ORDER — OXYCODONE-ACETAMINOPHEN 5-325 MG PO TABS: 1.0000 | ORAL_TABLET | Freq: Four times a day (QID) | ORAL | Status: DC | PRN

## 2013-10-12 MED ORDER — OXYCODONE-ACETAMINOPHEN 5-325 MG PO TABS
2.0000 | ORAL_TABLET | Freq: Once | ORAL | Status: AC
  Administered 2013-10-12: 2 via ORAL
  Filled 2013-10-12: qty 2

## 2013-10-12 MED ORDER — METRONIDAZOLE 500 MG PO TABS: 500.0000 mg | ORAL_TABLET | Freq: Two times a day (BID) | ORAL | Status: DC

## 2013-10-12 NOTE — MAU Note (Signed)
Radiology called and report reeived and then given to Wynelle BourgeoisMarie Williams CNM. Pt ncouraged to have surgical consultation with OB/GYN.

## 2013-10-12 NOTE — Discharge Instructions (Signed)
Ovarian Cyst °An ovarian cyst is a fluid-filled sac that forms on an ovary. The ovaries are small organs that produce eggs in women. Various types of cysts can form on the ovaries. Most are not cancerous. Many do not cause problems, and they often go away on their own. Some may cause symptoms and require treatment. Common types of ovarian cysts include: °· Functional cysts--These cysts may occur every month during the menstrual cycle. This is normal. The cysts usually go away with the next menstrual cycle if the woman does not get pregnant. Usually, there are no symptoms with a functional cyst. °· Endometrioma cysts--These cysts form from the tissue that lines the uterus. They are also called "chocolate cysts" because they become filled with blood that turns brown. This type of cyst can cause pain in the lower abdomen during intercourse and with your menstrual period. °· Cystadenoma cysts--This type develops from the cells on the outside of the ovary. These cysts can get very big and cause lower abdomen pain and pain with intercourse. This type of cyst can twist on itself, cut off its blood supply, and cause severe pain. It can also easily rupture and cause a lot of pain. °· Dermoid cysts--This type of cyst is sometimes found in both ovaries. These cysts may contain different kinds of body tissue, such as skin, teeth, hair, or cartilage. They usually do not cause symptoms unless they get very big. °· Theca lutein cysts--These cysts occur when too much of a certain hormone (human chorionic gonadotropin) is produced and overstimulates the ovaries to produce an egg. This is most common after procedures used to assist with the conception of a baby (in vitro fertilization). °CAUSES  °· Fertility drugs can cause a condition in which multiple large cysts are formed on the ovaries. This is called ovarian hyperstimulation syndrome. °· A condition called polycystic ovary syndrome can cause hormonal imbalances that can lead to  nonfunctional ovarian cysts. °SIGNS AND SYMPTOMS  °Many ovarian cysts do not cause symptoms. If symptoms are present, they may include: °· Pelvic pain or pressure. °· Pain in the lower abdomen. °· Pain during sexual intercourse. °· Increasing girth (swelling) of the abdomen. °· Abnormal menstrual periods. °· Increasing pain with menstrual periods. °· Stopping having menstrual periods without being pregnant. °DIAGNOSIS  °These cysts are commonly found during a routine or annual pelvic exam. Tests may be ordered to find out more about the cyst. These tests may include: °· Ultrasound. °· X-ray of the pelvis. °· CT scan. °· MRI. °· Blood tests. °TREATMENT  °Many ovarian cysts go away on their own without treatment. Your health care provider may want to check your cyst regularly for 2-3 months to see if it changes. For women in menopause, it is particularly important to monitor a cyst closely because of the higher rate of ovarian cancer in menopausal women. When treatment is needed, it may include any of the following: °· A procedure to drain the cyst (aspiration). This may be done using a long needle and ultrasound. It can also be done through a laparoscopic procedure. This involves using a thin, lighted tube with a tiny camera on the end (laparoscope) inserted through a small incision. °· Surgery to remove the whole cyst. This may be done using laparoscopic surgery or an open surgery involving a larger incision in the lower abdomen. °· Hormone treatment or birth control pills. These methods are sometimes used to help dissolve a cyst. °HOME CARE INSTRUCTIONS  °· Only take over-the-counter   or prescription medicines as directed by your health care provider. °· Follow up with your health care provider as directed. °· Get regular pelvic exams and Pap tests. °SEEK MEDICAL CARE IF:  °· Your periods are late, irregular, or painful, or they stop. °· Your pelvic pain or abdominal pain does not go away. °· Your abdomen becomes  larger or swollen. °· You have pressure on your bladder or trouble emptying your bladder completely. °· You have pain during sexual intercourse. °· You have feelings of fullness, pressure, or discomfort in your stomach. °· You lose weight for no apparent reason. °· You feel generally ill. °· You become constipated. °· You lose your appetite. °· You develop acne. °· You have an increase in body and facial hair. °· You are gaining weight, without changing your exercise and eating habits. °· You think you are pregnant. °SEEK IMMEDIATE MEDICAL CARE IF:  °· You have increasing abdominal pain. °· You feel sick to your stomach (nauseous), and you throw up (vomit). °· You develop a fever that comes on suddenly. °· You have abdominal pain during a bowel movement. °· Your menstrual periods become heavier than usual. °MAKE SURE YOU: °· Understand these instructions. °· Will watch your condition. °· Will get help right away if you are not doing well or get worse. °Document Released: 12/18/2004 Document Revised: 12/23/2012 Document Reviewed: 08/25/2012 °ExitCare® Patient Information ©2015 [employer]. This information is not intended to replace advice given to you by your health care provider. Make sure you discuss any questions you have with your health care provider. °Pelvic Inflammatory Disease °Pelvic inflammatory disease (PID) refers to an infection in some or all of the female organs. The infection can be in the uterus, ovaries, fallopian tubes, or the surrounding tissues in the pelvis. PID can cause abdominal or pelvic pain that comes on suddenly (acute pelvic pain). PID is a serious infection because it can lead to lasting (chronic) pelvic pain or the inability to have children (infertile).  °CAUSES  °The infection is often caused by the normal bacteria found in the vaginal tissues. PID may also be caused by an infection that is spread during sexual contact. PID can also occur following:  °· The birth of a baby.   °· A  miscarriage.   °· An abortion.   °· Major pelvic surgery.   °· The use of an intrauterine device (IUD).   °· A sexual assault.   °RISK FACTORS °Certain factors can put a person at higher risk for PID, such as: °· Being younger than 25 years. °· Being sexually active at a young age. °· Using nonbarrier contraception. °· Having multiple sexual partners. °· Having sex with someone who has symptoms of a genital infection. °· Using oral contraception. °Other times, certain behaviors can increase the possibility of getting PID, such as: °· Having sex during your period. °· Using a vaginal douche. °· Having an intrauterine device (IUD) in place. °SYMPTOMS  °· Abdominal or pelvic pain.   °· Fever.   °· Chills.   °· Abnormal vaginal discharge. °· Abnormal uterine bleeding.   °· Unusual pain shortly after finishing your period. °DIAGNOSIS  °Your caregiver will choose some of the following methods to make a diagnosis, such as:  °· Performing a physical exam and history. A pelvic exam typically reveals a very tender uterus and surrounding pelvis.   °· Ordering laboratory tests including a pregnancy test, blood tests, and urine test.  °· Ordering cultures of the vagina and cervix to check for a sexually transmitted infection (STI). °· Performing an ultrasound.   °· Performing   a laparoscopic procedure to look inside the pelvis.   °TREATMENT  °· Antibiotic medicines may be prescribed and taken by mouth.   °· Sexual partners may be treated when the infection is caused by a sexually transmitted disease (STD).   °· Hospitalization may be needed to give antibiotics intravenously. °· Surgery may be needed, but this is rare. °It may take weeks until you are completely well. If you are diagnosed with PID, you should also be checked for human immunodeficiency virus (HIV).   °HOME CARE INSTRUCTIONS  °· If given, take your antibiotics as directed. Finish the medicine even if you start to feel better.   °· Only take over-the-counter or  prescription medicines for pain, discomfort, or fever as directed by your caregiver.   °· Do not have sexual intercourse until treatment is completed or as directed by your caregiver. If PID is confirmed, your recent sexual partner(s) will need treatment.   °· Keep your follow-up appointments. °SEEK MEDICAL CARE IF:  °· You have increased or abnormal vaginal discharge.   °· You need prescription medicine for your pain.   °· You vomit.   °· You cannot take your medicines.   °· Your partner has an STD.   °SEEK IMMEDIATE MEDICAL CARE IF:  °· You have a fever.   °· You have increased abdominal or pelvic pain.   °· You have chills.   °· You have pain when you urinate.   °· You are not better after 72 hours following treatment.   °MAKE SURE YOU:  °· Understand these instructions. °· Will watch your condition. °· Will get help right away if you are not doing well or get worse. °Document Released: 12/18/2004 Document Revised: 04/14/2012 Document Reviewed: 12/14/2010 °ExitCare® Patient Information ©2015 [employer]. This information is not intended to replace advice given to you by your health care provider. Make sure you discuss any questions you have with your health care provider. ° °

## 2013-10-12 NOTE — MAU Note (Signed)
Pain pt is having assessed by Wynelle BourgeoisMarie Williams CNM.

## 2013-10-12 NOTE — Telephone Encounter (Signed)
Patient called, asking if she could be seen by a doctor before her scheduled appointment Thursday 10/15.  Patient states he pain is still a 10/10, is out of pain medication, that she tried to get a hospital follow up at [facility] but was told she needs a referral first from her PCP.  Patient has [place] [employer] Medicaid, [facility]   We are unable to see her before her scheduled follow up, are unable to call in pain medications.  As it appears she needs evaluation and possible intervention, but does not have PCP, patient was advised to go to an urgent care or to the ED.  Patient has a 31 year old son at home, is unable to go to the ED.  [facility] if [facility] can help.  Additionally, as they are currently listed on her card, to ask if they can send a referral for hospital follow up to [facility]. RN advised patient that we are a specialist and unable to perform as her PCP due to medicaid restrictions, that she needs to contact her medicaid case manager to change her card. Pt verbalized agreement and understanding. Andree CossHowell, Grey Rakestraw M, RN

## 2013-10-12 NOTE — MAU Note (Signed)
Pt states was just [facility]. Here today with constant pain. Has urology appt however has issues with referral (see notes), and needs medication for pain.

## 2013-10-12 NOTE — MAU Provider Note (Signed)
History     CSN: [id number]  Arrival date and time: 10/12/13 1605   First Provider Initiated Contact with Patient 10/12/13 1708      Chief Complaint  Patient presents with  . Kidney infection    HPI This is a 33 y.o. female who presents requesting pain medicines for kidney infection.   Was recently hospitalized for a presumed pyelonephritis and treated with IV antibiotics. Also treated for Trich at that time. Referred to Urology but has not been there yet. There was inflammation seen on both kidneys with UPJ obstruction and mild hydronephrosis on left. Apparent chronic inflammation changes seen bilaterally. There was also a Left adnexal process seen on the left.   Denies fever. History is remarkable for HIV.  RN note:  Pt states was just hospitalized for kidney infection for a few days at [facility]. Here today with constant pain. Has urology appt however has issues with referral (see notes), and needs medication for pain.      Hospital Course:  Pyelonephritis:  Started on IV antibiotics and urine cultures obtained. Urology consulted for left hydronephrosis and recommendations given to follow up as outpatient for repeat CT imaging with IV contrast and delayed images or potentially cystoscopy with retrograde pyelography.  She will be discharged home on oral antibiotics to complete the course.  Trichomoniasis:  She received a dose of flagyl and azithromycin.  HIV:  Continue HAART .  ANEMIA: hemoglobin dropped more than 1gm. She reported that she is menstruating. Recommend outpatient follow up with a CBC to check hemoglobin and follow up. She did not want to stay for more blood draws today and wanted to leave as soon as possible.  Procedures:  US RENAL  CT ABD AND PELVIS.    OB History   Grav Para Term Preterm Abortions TAB SAB Ect Mult Living   2 1 1  1  1   1       Past Medical History  Diagnosis Date  . HIV (human immunodeficiency virus infection)   . Trichomonas infection   .  Pyelonephritis   . Recurrent boils   . Depression   . Anxiety     Past Surgical History  Procedure Laterality Date  . Cesarean section      No family history on file.  History  Substance Use Topics  . Smoking status: Current Every Day Smoker -- 0.15 packs/day    Types: Cigarettes    Last Attempt to Quit: 06/13/2013  . Smokeless tobacco: Never Used  . Alcohol Use: No    Allergies:  Allergies  Allergen Reactions  . Latex Other (See Comments)    Irritates bladder  . Sulfamethoxazole-Trimethoprim Other (See Comments)    Irritates bladder  . Tylenol [Acetaminophen] Other (See Comments)    Makes me pee more    Prescriptions prior to admission  Medication Sig Dispense Refill  . abacavir (ZIAGEN) 20 MG/ML solution Take 30 mLs (600 mg total) by mouth daily.  960 mL  12  . dolutegravir (TIVICAY) 50 MG tablet Take 1 tablet (50 mg total) by mouth daily.  30 tablet  12  . lamiVUDine (EPIVIR) 10 MG/ML solution Take 30 mLs (300 mg total) by mouth daily.  960 mL  12  . levofloxacin (LEVAQUIN) 500 MG tablet Take 1 tablet (500 mg total) by mouth daily.  5 tablet  0  . oxyCODONE (OXY IR/ROXICODONE) 5 MG immediate release tablet Take 1 tablet (5 mg total) by mouth every 4 (four) hours as needed for  moderate pain.  15 tablet  0  . rilpivirine (EDURANT) 25 MG TABS tablet Take 1 tablet (25 mg total) by mouth daily with breakfast.  30 tablet  12    Review of Systems  Constitutional: Positive for malaise/fatigue. Negative for fever and chills.  Gastrointestinal: Positive for abdominal pain. Negative for nausea and vomiting.  Musculoskeletal: Positive for back pain.  Neurological: Negative for dizziness and headaches.   Physical Exam   Blood pressure 113/75, pulse 83, temperature 98.5 F (36.9 C), temperature source Oral, resp. rate 16, last menstrual period 09/26/2013.  Physical Exam  Constitutional: She is oriented to person, place, and time. She appears well-developed and  well-nourished.  HENT:  Head: Normocephalic.  Cardiovascular: Normal rate.   Respiratory: Effort normal.  GI: Soft. She exhibits no distension. There is tenderness (lower pelvis). There is no rebound and no guarding.  Genitourinary:  Vaginal discharge grey and milky No lesions or erethema Cervix red and friable Uterus small and slightly tender Adnexae tender on right   Musculoskeletal: Normal range of motion.  Neurological: She is alert and oriented to person, place, and time.  Skin: Skin is warm and dry.  Psychiatric: She has a normal mood and affect.    MAU Course  Procedures  MDM Results for orders placed during the hospital encounter of 10/12/13 (from the past 72 hour(s))  URINALYSIS, ROUTINE W REFLEX MICROSCOPIC     Status: Abnormal   Collection Time    10/12/13  4:25 PM      Result Value Ref Range   Color, Urine YELLOW  YELLOW   APPearance CLOUDY (*) CLEAR   Specific Gravity, Urine 1.020  1.005 - 1.030   pH 6.0  5.0 - 8.0   Glucose, UA NEGATIVE  NEGATIVE mg/dL   Hgb urine dipstick LARGE (*) NEGATIVE   Bilirubin Urine NEGATIVE  NEGATIVE   Ketones, ur NEGATIVE  NEGATIVE mg/dL   Protein, ur 30 (*) NEGATIVE mg/dL   Urobilinogen, UA 0.2  0.0 - 1.0 mg/dL   Nitrite NEGATIVE  NEGATIVE   Leukocytes, UA LARGE (*) NEGATIVE  URINE MICROSCOPIC-ADD ON     Status: Abnormal   Collection Time    10/12/13  4:25 PM      Result Value Ref Range   Squamous Epithelial / LPF FEW (*) RARE   WBC, UA TOO NUMEROUS TO COUNT  <3 WBC/hpf   RBC / HPF 7-10  <3 RBC/hpf  POCT PREGNANCY, URINE     Status: None   Collection Time    10/12/13  4:46 PM      Result Value Ref Range   Preg Test, Ur NEGATIVE  NEGATIVE   Comment:            THE SENSITIVITY OF THIS     METHODOLOGY IS >24 mIU/mL  CBC WITH DIFFERENTIAL     Status: Abnormal   Collection Time    10/12/13  5:20 PM      Result Value Ref Range   WBC 3.3 (*) 4.0 - 10.5 K/uL   RBC 3.50 (*) 3.87 - 5.11 MIL/uL   Hemoglobin 10.6 (*) 12.0 -  15.0 g/dL   HCT 16.132.6 (*) 09.636.0 - 04.546.0 %   MCV 93.1  78.0 - 100.0 fL   MCH 30.3  26.0 - 34.0 pg   MCHC 32.5  30.0 - 36.0 g/dL   RDW 40.916.1 (*) 81.111.5 - 91.415.5 %   Platelets 218  150 - 400 K/uL   Neutrophils Relative % 42 (*)  43 - 77 %   Neutro Abs 1.4 (*) 1.7 - 7.7 K/uL   Lymphocytes Relative 46  12 - 46 %   Lymphs Abs 1.5  0.7 - 4.0 K/uL   Monocytes Relative 9  3 - 12 %   Monocytes Absolute 0.3  0.1 - 1.0 K/uL   Eosinophils Relative 2  0 - 5 %   Eosinophils Absolute 0.1  0.0 - 0.7 K/uL   Basophils Relative 1  0 - 1 %   Basophils Absolute 0.0  0.0 - 0.1 K/uL  WET PREP, GENITAL     Status: Abnormal   Collection Time    10/12/13  7:50 PM      Result Value Ref Range   Yeast Wet Prep HPF POC NONE SEEN  NONE SEEN   Trich, Wet Prep NONE SEEN  NONE SEEN   Clue Cells Wet Prep HPF POC FEW (*) NONE SEEN   WBC, Wet Prep HPF POC MANY BACTERIA SEEN (*) NONE SEEN   Comment: MODERATE BACTERIA SEEN   US Transvaginal Non-ob  10/12/2013   CLINICAL DATA:  Evaluate left-sided adnexal cyst seen on prior CT. Lower abdominal pain for 2 years. Subsequent encounter.  EXAM: TRANSABDOMINAL AND TRANSVAGINAL ULTRASOUND OF PELVIS  TECHNIQUE: Both transabdominal and transvaginal ultrasound examinations of the pelvis were performed. Transabdominal technique was performed for global imaging of the pelvis including uterus, ovaries, adnexal regions, and pelvic cul-de-sac. It was necessary to proceed with endovaginal exam following the transabdominal exam to visualize the endometrium, bilateral ovaries and adnexa.  COMPARISON:  CT abdomen and pelvis - 09/30/2013    FINDINGS: Uterus  Normal appearance of the anteverted uterus measuring approximately 7.8 x 3.6 x 4.0 cm. No discrete uterine mass.                      Endometrium  Normal in size measures 6.5 mm in diameter (image 31).                      Right ovary  Normal in size and appearance measuring 3.2 x 1.6 x 2.3 cm.                     Left ovary  The left ovary is  enlarged measuring 4.9 x 3.0 x 4.3 cm.                                       There is an approximately 2.2 x 2.9 x 2.5 cm complex partially cystic lesion within the left ovary which is noted to contain thickened irregular septations (representative images 53 and 54) which demonstrate internal blood flow (image 42), characteristics worrisome for malignancy.                       Note is also made of an approximately 3.5 x 2.5 x 3.2 cm anechoic partially exophytic simple cyst within the left ovary.                       Other findings  No free fluid.    IMPRESSION: Approximately 2.9 cm left-sided complex partially cystic lesion contains thickened irregular enhancing septations, characteristics worrisome for malignancy.  As such, referral to [facility] for surgical evaluation is recommended.                            These results will be called to the ordering clinician or representative by the Radiologist Assistant, and communication documented in the PACS or zVision Dashboard.     Electronically Signed   By: Simonne Come M.D.   On: 10/12/2013 19:26      Ct Abdomen Pelvis W Contrast  09/30/2013   CLINICAL DATA:  Mid and lower back and abdominal pain intermittently ; vomiting without diarrhea; on medication for age by the  EXAM: CT ABDOMEN AND PELVIS WITH CONTRAST  TECHNIQUE: Multidetector CT imaging of the abdomen and pelvis was performed using the standard protocol following bolus administration of intravenous contrast.  CONTRAST:  80mL OMNIPAQUE IOHEXOL 300 MG/ML SOLN intravenously. The patient also received oral contrast material.  COMPARISON:  None.  FINDINGS: The left kidney is enlarged and contains multiple hypodensities which communicate with one-another which exhibit HU IUD of between +2 and +25. There is left mild hydronephrosis and proximal hydroureter without a definite obstructing stone. On the right there is minimal hypodensity in the upper pole. The perinephric fat  exhibits normal density bilaterally. The urinary bladder is only partially distended.  The liver contains calcifications consistent with previous granulomatous infection. There is no focal mass or ductal dilation. The gallbladder, spleen, and moderately distended stomach are grossly normal. There is mild ductal prominence throughout the course of the pancreas without evidence of masses or inflammation. The adrenal glands are normal in size.  There is an enlarged lymph node medial to the left kidney which measures 2.8 x 2.3 x 3.3 cm. There are additional enlarged periaortic and pericaval lymph nodes. The caliber of the abdominal aorta is normal. Evaluation of the small and large bowel is limited in that the contrast has reached only the proximal small bowel. There is no obstructive pattern. One cannot exclude mural thickening especially in the ascending colon.  Within the pelvis there is a left adnexal fluid collection. Which measures 4.6 x 2.7 cm. The uterus is unremarkable. There is no right adnexal mass. There are numerous normal-sized to borderline enlarged pelvic lymph nodes. There is no inguinal nor umbilical hernia.  The lung bases exhibit no acute abnormalities. The lumbar spine and bony pelvis are unremarkable.  IMPRESSION: 1. The left kidney is enlarged and exhibits multiple low-density areas which communicate with one-another which may reflect cysts, abscesses, or chronic caliectasis. There is also mild left hydronephrosis without obvious etiology. Renal ultrasound may be useful in better characterizing the low-density regions as simple cystic or non simple cystic structures. Thereis a similar appearing finding in the upper pole of the right kidney. There are mildly enlarged periaortic and pericaval and left perirenal lymph nodes. 2. There is no acute hepatobiliary abnormality. There is mild ductal prominence of the otherwise normal-appearing pancreas. 3. No acute bowel abnormality is demonstrated. 4.  There is a cystic appearing left adnexal process with HU value of +17. Pelvic ultrasound is recommended to further evaluate this region if the patient is having any symptoms referable to the pelvis.   Electronically Signed   By: David  [place]   On: 09/30/2013 20:56   US Renal  10/01/2013   CLINICAL DATA:  Followup renal cysts.  EXAM: RENAL/URINARY TRACT ULTRASOUND COMPLETE  COMPARISON:  CT scan 09/30/2013.  FINDINGS: Right Kidney:  Length: 14.4 cm. Normal renal  cortical thickness. Mild increased echogenicity (similar to liver). No focal lesions or hydronephrosis. Slightly prominent extra renal pelvis.  Left Kidney:  Length: 14.1 cm. Diffuse increased echogenicity with hydronephrosis and upper left hydroureter. Based on the CT scan there appears to be a an area of strictured narrowing or E UPJ obstruction best seen non the CT scan on image number 45. Also, base of the CT scan the collecting system centrally is not significantly dilated. There is wall thickening of the collecting system and I suspect there are areas of strictured narrowing with dilated/obstructed the calices and possible cavus seal diverticuli. On the ultrasound the fluid in the dilated collecting system appears complex.  Bladder:  Appears normal for degree of bladder distention.  IMPRESSION: 1. Suspect chronic inflammatory or infectious changes involving the left kidney, mainly based on the prior CT scan. I suspect there are areas of strictured narrowing involving the collecting system with dilated obstructed calices and or possible caliceal diverticulum. 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 2. The right kidney also demonstrates some chronic inflammatory changes but no hydronephrosis or UPJ obstruction. The right ureteral wall appears thickened on the prior CT scan. 3. The retroperitoneal lymphadenopathy seen on the CT scan is probably explained by chronic inflammation or  infection of the kidneys. 4. I would recommend a urology consultation. I think the next best test would either be retrograde ureteropyelograms or possibly an MRI abdomen without and with contrast.   Electronically Signed   By: Loralie ChampagneMark  Gallerani M.D.   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

## 2013-10-13 ENCOUNTER — Telehealth: Payer: Self-pay

## 2013-10-13 ENCOUNTER — Telehealth: Payer: Self-pay | Admitting: Infectious Disease

## 2013-10-13 LAB — GC/CHLAMYDIA PROBE AMP
CT PROBE, AMP APTIMA: NEGATIVE
GC PROBE AMP APTIMA: NEGATIVE

## 2013-10-13 LAB — CA 125: CA 125: 4 U/mL (ref <35)

## 2013-10-13 MED ORDER — METRONIDAZOLE 500 MG PO TABS: 500.0000 mg | ORAL_TABLET | Freq: Two times a day (BID) | ORAL | Status: DC

## 2013-10-13 MED ORDER — DOXYCYCLINE HYCLATE 100 MG PO CAPS: 100.0000 mg | ORAL_CAPSULE | Freq: Two times a day (BID) | ORAL | Status: DC

## 2013-10-13 NOTE — MAU Provider Note (Signed)
Attestation of Attending Supervision of Advanced Practitioner (PA/CNM/NP): Evaluation and management procedures were performed by the Advanced Practitioner under my supervision and collaboration.  I have reviewed the Advanced Practitioner's note and chart, and I agree with the management and plan.  PRATT,TANYA S, MD [facility] Attending 10/13/2013 5:10 PM   

## 2013-10-13 NOTE — Telephone Encounter (Signed)
Message copied by Louanna RawAMPBELL, Aminta Sakurai M on Tue Oct 13, 2013  9:49 AM ------      Message from: Aviva SignsWILLIAMS, MARIE L      Created: Mon Oct 12, 2013  5:39 PM      Regarding: Need to refer her to Urology       Treated for kidney issues a week ago            Needs referral to Urology who saw her in hospital but her Medicaid card has us as her primary doctor            Can you refer her to [facility]             Thanks      Hilda LiasMarie ------

## 2013-10-13 NOTE — Telephone Encounter (Signed)
Pt had called me yesterday requesting narcotics for kidney infection. She was instructed to followup with her PCP who was listed in EPIC as Dr. Orvan Falconerampbell for pain meds  I told her our clinic has policy of NOT initiating narcotics rx for new patients given multitude of problems and that if Dr. Orvan Falconerampbell was indeed her PCP it would be his call not mine  I spoke with Dr. Orvan Falconerampbell and he agreed pts condition did not merit narcotics and he did not wish to rx narcotics

## 2013-10-13 NOTE — Telephone Encounter (Signed)
Called [facility]-- referral information given. Earliest appointment available was early November, however, it was noted patient should be seen sooner. Spoke to triage nurse who stated she will call patient in an attempt to schedule appointment/squeeze in for earlier appointment. No further information needed from clinic-- patient will be contacted by [facility] directly regarding appointment to be seen. Called patient and informed her she will be receiving an appointment/call from [facility]. Also informed patient of clinic appointment 11/30/13. Patient verbalized understanding and gratitude. No questions or concerns.

## 2013-10-14 ENCOUNTER — Encounter: Payer: Medicaid Other | Admitting: Obstetrics & Gynecology

## 2013-10-15 ENCOUNTER — Encounter: Payer: Self-pay | Admitting: Internal Medicine

## 2013-10-15 ENCOUNTER — Ambulatory Visit (INDEPENDENT_AMBULATORY_CARE_PROVIDER_SITE_OTHER): Payer: Medicaid Other | Admitting: Internal Medicine

## 2013-10-15 VITALS — BP 104/72 | HR 88 | Temp 98.1°F | Ht 61.0 in | Wt 140.0 lb

## 2013-10-15 DIAGNOSIS — Z21 Asymptomatic human immunodeficiency virus [HIV] infection status: Secondary | ICD-10-CM

## 2013-10-15 DIAGNOSIS — Z23 Encounter for immunization: Secondary | ICD-10-CM

## 2013-10-15 DIAGNOSIS — B20 Human immunodeficiency virus [HIV] disease: Secondary | ICD-10-CM

## 2013-10-15 LAB — CBC WITH DIFFERENTIAL/PLATELET
BASOS ABS: 0 10*3/uL (ref 0.0–0.1)
BASOS PCT: 0 % (ref 0–1)
EOS PCT: 2 % (ref 0–5)
Eosinophils Absolute: 0.1 10*3/uL (ref 0.0–0.7)
HCT: 29.1 % — ABNORMAL LOW (ref 36.0–46.0)
Hemoglobin: 9.9 g/dL — ABNORMAL LOW (ref 12.0–15.0)
Lymphocytes Relative: 44 % (ref 12–46)
Lymphs Abs: 1.4 10*3/uL (ref 0.7–4.0)
MCH: 29.9 pg (ref 26.0–34.0)
MCHC: 34 g/dL (ref 30.0–36.0)
MCV: 87.9 fL (ref 78.0–100.0)
Monocytes Absolute: 0.3 10*3/uL (ref 0.1–1.0)
Monocytes Relative: 9 % (ref 3–12)
Neutro Abs: 1.4 10*3/uL — ABNORMAL LOW (ref 1.7–7.7)
Neutrophils Relative %: 45 % (ref 43–77)
PLATELETS: 253 10*3/uL (ref 150–400)
RBC: 3.31 MIL/uL — ABNORMAL LOW (ref 3.87–5.11)
RDW: 16.5 % — AB (ref 11.5–15.5)
WBC: 3.1 10*3/uL — ABNORMAL LOW (ref 4.0–10.5)

## 2013-10-15 LAB — COMPLETE METABOLIC PANEL WITH GFR
ALBUMIN: 2.7 g/dL — AB (ref 3.5–5.2)
ALT: 10 U/L (ref 0–35)
AST: 25 U/L (ref 0–37)
Alkaline Phosphatase: 60 U/L (ref 39–117)
BUN: 9 mg/dL (ref 6–23)
CALCIUM: 8.4 mg/dL (ref 8.4–10.5)
CHLORIDE: 106 meq/L (ref 96–112)
CO2: 23 meq/L (ref 19–32)
Creat: 0.61 mg/dL (ref 0.50–1.10)
GFR, Est Non African American: >89 mL/min
Glucose, Bld: 95 mg/dL (ref 70–99)
POTASSIUM: 3.5 meq/L (ref 3.5–5.3)
Sodium: 136 mEq/L (ref 135–145)
Total Bilirubin: 0.2 mg/dL (ref 0.2–1.2)
Total Protein: 8.4 g/dL — ABNORMAL HIGH (ref 6.0–8.3)

## 2013-10-15 MED ORDER — DOLUTEGRAVIR SODIUM 50 MG PO TABS: 50.0000 mg | ORAL_TABLET | Freq: Every day | ORAL | Status: DC

## 2013-10-15 MED ORDER — RILPIVIRINE HCL 25 MG PO TABS: 25.0000 mg | ORAL_TABLET | Freq: Every day | ORAL | Status: DC

## 2013-10-15 MED ORDER — LAMIVUDINE 10 MG/ML PO SOLN: 300.0000 mg | Freq: Every day | ORAL | Status: DC

## 2013-10-15 NOTE — Progress Notes (Signed)
   Subjective:    Patient ID: Volney PresserSharon D Brines, female    DOB: 01/01/1981, 33 y.o.   MRN: [id number]  HPI Returning patient for follow up of HIV.  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  Was taking intermittently though says she kept getting refills from ED by her report.  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 Initial CD4 of 90, vl [id number] and now CD4 up to 130 and vl 40 in Sept.  Some thrush noted at intake now resolved.  Has had difficulty swallowing pills and started on rilpivirine, tivicay which she is able to swallow and lamivudine with abacavir solution.  Has been taking just 3 mL of lamivudine and abacavir instead of 30 mL each.    Review of Systems  Constitutional: Negative for fever, fatigue and unexpected weight change.  HENT: Negative for trouble swallowing.   Gastrointestinal: Negative for nausea and diarrhea.  Musculoskeletal: Positive for myalgias.  Skin: Negative for rash.  Neurological: Negative for dizziness and light-headedness.  Psychiatric/Behavioral: Negative for sleep disturbance.       Objective:   Physical Exam  Constitutional: She appears well-developed and well-nourished. No distress.  HENT:  Mouth/Throat: Oropharynx is clear and moist. No oropharyngeal exudate.  Eyes: No scleral icterus.  Cardiovascular: Normal rate, regular rhythm and normal heart sounds.   No murmur heard. Pulmonary/Chest: Effort normal and breath sounds normal. No respiratory distress. She has no wheezes.  Lymphadenopathy:    She has no cervical adenopathy.  Skin: No rash noted.          Assessment & Plan:

## 2013-10-15 NOTE — Addendum Note (Signed)
Addended by: Andree CossHOWELL, Jaydian Santana M on: 10/15/2013 11:01 AM   Modules accepted: Orders

## 2013-10-15 NOTE — Assessment & Plan Note (Signed)
Will check labs today since she has been underdosing the NRTIs.  Will also streamline by taking off abacavir.  RTC 2-3 months.

## 2013-10-16 ENCOUNTER — Other Ambulatory Visit: Payer: Self-pay | Admitting: Urology

## 2013-10-16 ENCOUNTER — Encounter: Payer: Medicaid Other | Admitting: Obstetrics and Gynecology

## 2013-10-16 LAB — T-HELPER CELL (CD4) - (RCID CLINIC ONLY)
CD4 % Helper T Cell: 12 % — ABNORMAL LOW (ref 33–55)
CD4 T Cell Abs: 170 /uL — ABNORMAL LOW (ref 400–2700)

## 2013-10-19 LAB — HIV-1 RNA QUANT-NO REFLEX-BLD: HIV-1 RNA Quant, Log: <1.3 {Log} (ref <1.30)

## 2013-10-21 ENCOUNTER — Encounter (HOSPITAL_COMMUNITY): Payer: Self-pay

## 2013-10-21 ENCOUNTER — Encounter (HOSPITAL_COMMUNITY): Payer: Self-pay | Admitting: Pharmacy Technician

## 2013-10-21 ENCOUNTER — Encounter (HOSPITAL_COMMUNITY)
Admission: RE | Admit: 2013-10-21 | Discharge: 2013-10-21 | Disposition: A | Discharge status: Home / Self Care | Payer: Medicaid Other | Source: Ambulatory Visit | Attending: Urology | Admitting: Urology

## 2013-10-21 DIAGNOSIS — Z01818 Encounter for other preprocedural examination: Secondary | ICD-10-CM | POA: Insufficient documentation

## 2013-10-21 DIAGNOSIS — N135 Crossing vessel and stricture of ureter without hydronephrosis: Secondary | ICD-10-CM | POA: Insufficient documentation

## 2013-10-21 DIAGNOSIS — N1330 Unspecified hydronephrosis: Secondary | ICD-10-CM | POA: Diagnosis not present

## 2013-10-21 HISTORY — DX: Unspecified hydronephrosis: N13.30

## 2013-10-21 HISTORY — DX: Frequency of micturition: R35.0

## 2013-10-21 HISTORY — DX: Unspecified ovarian cyst, unspecified side: N83.209

## 2013-10-21 LAB — CBC
HCT: 31.9 % — ABNORMAL LOW (ref 36.0–46.0)
Hemoglobin: 10.4 g/dL — ABNORMAL LOW (ref 12.0–15.0)
MCH: 30.2 pg (ref 26.0–34.0)
MCHC: 32.6 g/dL (ref 30.0–36.0)
MCV: 92.7 fL (ref 78.0–100.0)
Platelets: 296 10*3/uL (ref 150–400)
RBC: 3.44 MIL/uL — ABNORMAL LOW (ref 3.87–5.11)
RDW: 15.8 % — ABNORMAL HIGH (ref 11.5–15.5)
WBC: 3 10*3/uL — ABNORMAL LOW (ref 4.0–10.5)

## 2013-10-21 LAB — BASIC METABOLIC PANEL
Anion gap: 11 (ref 5–15)
BUN: 10 mg/dL (ref 6–23)
CHLORIDE: 99 meq/L (ref 96–112)
CO2: 24 mEq/L (ref 19–32)
Calcium: 9.3 mg/dL (ref 8.4–10.5)
Creatinine, Ser: 0.54 mg/dL (ref 0.50–1.10)
GFR calc non Af Amer: >90 mL/min (ref >90)
Glucose, Bld: 88 mg/dL (ref 70–99)
POTASSIUM: 4.1 meq/L (ref 3.7–5.3)
Sodium: 134 mEq/L — ABNORMAL LOW (ref 137–147)

## 2013-10-21 LAB — HCG, SERUM, QUALITATIVE: Preg, Serum: NEGATIVE

## 2013-10-21 NOTE — Pre-Procedure Instructions (Signed)
EKG AND CXR NOT NEEDED PER ANESTHESIOLOGIST'S GUIDELINES. 

## 2013-10-21 NOTE — Patient Instructions (Signed)
[facility]  ON:   Wednesday  10/28  REPORT TO  SHORT STAY CENTER AT:  8:00 AM    DO NOT EAT OR DRINK ANYTHING AFTER MIDNIGHT THE NIGHT BEFORE YOUR SURGERY.  YOU MAY BRUSH YOUR TEETH, RINSE OUT YOUR MOUTH--BUT NO WATER, NO FOOD, NO CHEWING GUM, NO MINTS, NO CANDIES, NO CHEWING TOBACCO.  PLEASE TAKE THE FOLLOWING MEDICATIONS THE AM OF YOUR SURGERY WITH A FEW SIPS OF WATER:  NO MEDICATIONS TO TAKE    DO NOT BRING VALUABLES, MONEY, CREDIT CARDS.  DO NOT WEAR JEWELRY, MAKE-UP, NAIL POLISH AND NO METAL PINS OR CLIPS IN YOUR HAIR. CONTACT LENS, DENTURES / PARTIALS, GLASSES SHOULD NOT BE WORN TO SURGERY AND IN MOST CASES-HEARING AIDS WILL NEED TO BE REMOVED.  BRING YOUR GLASSES CASE, ANY EQUIPMENT NEEDED FOR YOUR CONTACT LENS. FOR PATIENTS ADMITTED TO THE HOSPITAL--CHECK OUT TIME THE DAY OF DISCHARGE IS 11:00 AM.  ALL INPATIENT ROOMS ARE PRIVATE - WITH BATHROOM, TELEPHONE, TELEVISION AND WIFI INTERNET.  IF YOU ARE BEING DISCHARGED THE SAME DAY OF YOUR SURGERY--YOU CAN NOT DRIVE YOURSELF HOME--AND SHOULD NOT GO HOME ALONE BY TAXI OR BUS.  NO DRIVING OR OPERATING MACHINERY, OR MAKING LEGAL DECISIONS FOR 24 HOURS FOLLOWING ANESTHESIA / PAIN MEDICATIONS.  PLEASE MAKE ARRANGEMENTS FOR SOMEONE TO BE WITH YOU AT HOME THE FIRST 24 HOURS AFTER SURGERY. RESPONSIBLE DRIVER'S NAME / PHONE   PT'S SISTER SHIRLEY WILLIAMS  [phone]                                                   PLEASE READ OVER ANY  FACT SHEETS THAT YOU WERE GIVEN: MRSA INFORMATION, BLOOD TRANSFUSION INFORMATION, INCENTIVE SPIROMETER INFORMATION.  PLEASE BE AWARE THAT YOU MAY NEED ADDITIONAL BLOOD DRAWN DAY OF YOUR SURGERY  _______________________________________________________________________   [facility] Before surgery, you can play an important role.  Because skin is not sterile, your skin needs to be as free of germs as possible.  You can reduce the number of  germs on your skin by washing with CHG (chlorahexidine gluconate) soap before surgery.  CHG is an antiseptic cleaner which kills germs and bonds with the skin to continue killing germs even after washing. Please DO NOT use if you have an allergy to CHG or antibacterial soaps.  If your skin becomes reddened/irritated stop using the CHG and inform your nurse when you arrive at Short Stay. Do not shave (including legs and underarms) for at least 48 hours prior to the first CHG shower.  You may shave your face/neck. Please follow these instructions carefully:  1.  Shower with CHG Soap the night before surgery and the  morning of Surgery.  2.  If you choose to wash your hair, wash your hair first as usual with your  normal  shampoo.  3.  After you shampoo, rinse your hair and body thoroughly to remove the  shampoo.                           4.  Use CHG as you would any other liquid soap.  You can apply chg directly  to the skin and wash  Gently with a scrungie or clean washcloth.  5.  Apply the CHG Soap to your body ONLY FROM THE NECK DOWN.   Do not use on face/ open                           Wound or open sores. Avoid contact with eyes, ears mouth and genitals (private parts).                       Wash face,  Genitals (private parts) with your normal soap.             6.  Wash thoroughly, paying special attention to the area where your surgery  will be performed.  7.  Thoroughly rinse your body with warm water from the neck down.  8.  DO NOT shower/wash with your normal soap after using and rinsing off  the CHG Soap.                9.  Pat yourself dry with a clean towel.            10.  Wear clean pajamas.            11.  Place clean sheets on your bed the night of your first shower and do not  sleep with pets. Day of Surgery : Do not apply any lotions/deodorants the morning of surgery.  Please wear clean clothes to the hospital/surgery center.  FAILURE TO FOLLOW THESE  INSTRUCTIONS MAY RESULT IN THE CANCELLATION OF YOUR SURGERY PATIENT SIGNATURE_________________________________  NURSE SIGNATURE__________________________________  ________________________________________________________________________

## 2013-10-22 ENCOUNTER — Encounter: Payer: Medicaid Other | Admitting: Obstetrics & Gynecology

## 2013-10-27 NOTE — Progress Notes (Unsigned)
Patient ID: Volney PresserSharon D Spurlock, female   DOB: 12/12/1980, 33 y.o.   MRN: [id number]

## 2013-10-28 ENCOUNTER — Encounter (HOSPITAL_COMMUNITY)
Admission: RE | Disposition: A | Discharge status: Home / Self Care | Payer: Self-pay | Source: Ambulatory Visit | Attending: Urology

## 2013-10-28 ENCOUNTER — Encounter (HOSPITAL_COMMUNITY): Payer: Medicaid Other | Admitting: Anesthesiology

## 2013-10-28 ENCOUNTER — Encounter (HOSPITAL_COMMUNITY): Payer: Self-pay | Admitting: *Deleted

## 2013-10-28 ENCOUNTER — Ambulatory Visit (HOSPITAL_COMMUNITY)
Admission: RE | Admit: 2013-10-28 | Discharge: 2013-10-28 | Disposition: A | Discharge status: Home / Self Care | Payer: Medicaid Other | Source: Ambulatory Visit | Attending: Urology | Admitting: Urology

## 2013-10-28 ENCOUNTER — Ambulatory Visit (HOSPITAL_COMMUNITY): Payer: Medicaid Other | Admitting: Anesthesiology

## 2013-10-28 DIAGNOSIS — Z87440 Personal history of urinary (tract) infections: Secondary | ICD-10-CM | POA: Insufficient documentation

## 2013-10-28 DIAGNOSIS — Z886 Allergy status to analgesic agent status: Secondary | ICD-10-CM | POA: Diagnosis not present

## 2013-10-28 DIAGNOSIS — N1330 Unspecified hydronephrosis: Secondary | ICD-10-CM | POA: Insufficient documentation

## 2013-10-28 DIAGNOSIS — N110 Nonobstructive reflux-associated chronic pyelonephritis: Secondary | ICD-10-CM | POA: Insufficient documentation

## 2013-10-28 DIAGNOSIS — N12 Tubulo-interstitial nephritis, not specified as acute or chronic: Secondary | ICD-10-CM

## 2013-10-28 DIAGNOSIS — F1290 Cannabis use, unspecified, uncomplicated: Secondary | ICD-10-CM | POA: Diagnosis not present

## 2013-10-28 DIAGNOSIS — Z882 Allergy status to sulfonamides status: Secondary | ICD-10-CM | POA: Insufficient documentation

## 2013-10-28 DIAGNOSIS — Z91040 Latex allergy status: Secondary | ICD-10-CM | POA: Insufficient documentation

## 2013-10-28 DIAGNOSIS — B20 Human immunodeficiency virus [HIV] disease: Secondary | ICD-10-CM | POA: Diagnosis not present

## 2013-10-28 DIAGNOSIS — N1370 Vesicoureteral-reflux, unspecified: Secondary | ICD-10-CM

## 2013-10-28 DIAGNOSIS — F17210 Nicotine dependence, cigarettes, uncomplicated: Secondary | ICD-10-CM | POA: Insufficient documentation

## 2013-10-28 DIAGNOSIS — N281 Cyst of kidney, acquired: Secondary | ICD-10-CM | POA: Insufficient documentation

## 2013-10-28 HISTORY — PX: CYSTOSCOPY WITH RETROGRADE PYELOGRAM, URETEROSCOPY AND STENT PLACEMENT: SHX5789

## 2013-10-28 SURGERY — CYSTOURETEROSCOPY, WITH RETROGRADE PYELOGRAM AND STENT INSERTION
Anesthesia: General | Laterality: Bilateral

## 2013-10-28 MED ORDER — LACTATED RINGERS IV SOLN
INTRAVENOUS | Status: DC
  Administered 2013-10-28: 1000 mL via INTRAVENOUS

## 2013-10-28 MED ORDER — DIATRIZOATE MEGLUMINE 30 % UR SOLN
URETHRAL | Status: DC | PRN
  Administered 2013-10-28: 300 mL via URETHRAL

## 2013-10-28 MED ORDER — FENTANYL CITRATE 0.05 MG/ML IJ SOLN
INTRAMUSCULAR | Status: AC
  Filled 2013-10-28: qty 2

## 2013-10-28 MED ORDER — LACTATED RINGERS IV SOLN
INTRAVENOUS | Status: DC | PRN
  Administered 2013-10-28: 10:00:00 via INTRAVENOUS

## 2013-10-28 MED ORDER — SODIUM CHLORIDE 0.9 % IR SOLN
Status: DC | PRN
  Administered 2013-10-28: 4000 mL

## 2013-10-28 MED ORDER — PROMETHAZINE HCL 25 MG/ML IJ SOLN: 6.2500 mg | INTRAMUSCULAR | Status: DC | PRN

## 2013-10-28 MED ORDER — FENTANYL CITRATE 0.05 MG/ML IJ SOLN: 25.0000 ug | INTRAMUSCULAR | Status: DC | PRN

## 2013-10-28 MED ORDER — HYDROMORPHONE HCL 2 MG PO TABS
2.0000 mg | ORAL_TABLET | Freq: Once | ORAL | Status: AC
  Administered 2013-10-28: 2 mg via ORAL
  Filled 2013-10-28: qty 1

## 2013-10-28 MED ORDER — CIPROFLOXACIN IN D5W 400 MG/200ML IV SOLN
400.0000 mg | INTRAVENOUS | Status: AC
  Administered 2013-10-28: 400 mg via INTRAVENOUS

## 2013-10-28 MED ORDER — LIDOCAINE HCL 2 % EX GEL
CUTANEOUS | Status: AC
  Filled 2013-10-28: qty 10

## 2013-10-28 MED ORDER — MIDAZOLAM HCL 5 MG/5ML IJ SOLN
INTRAMUSCULAR | Status: DC | PRN
  Administered 2013-10-28: 2 mg via INTRAVENOUS

## 2013-10-28 MED ORDER — PROPOFOL 10 MG/ML IV BOLUS
INTRAVENOUS | Status: DC | PRN
  Administered 2013-10-28: 130 mg via INTRAVENOUS

## 2013-10-28 MED ORDER — PHENAZOPYRIDINE HCL 200 MG PO TABS
200.0000 mg | ORAL_TABLET | Freq: Once | ORAL | Status: AC
  Administered 2013-10-28: 200 mg via ORAL
  Filled 2013-10-28: qty 1

## 2013-10-28 MED ORDER — LACTATED RINGERS IV SOLN: INTRAVENOUS | Status: DC

## 2013-10-28 MED ORDER — ONDANSETRON HCL 4 MG/2ML IJ SOLN
INTRAMUSCULAR | Status: DC | PRN
  Administered 2013-10-28: 4 mg via INTRAVENOUS

## 2013-10-28 MED ORDER — PROPOFOL 10 MG/ML IV BOLUS
INTRAVENOUS | Status: AC
  Filled 2013-10-28: qty 20

## 2013-10-28 MED ORDER — CIPROFLOXACIN IN D5W 400 MG/200ML IV SOLN
INTRAVENOUS | Status: AC
  Filled 2013-10-28: qty 200

## 2013-10-28 MED ORDER — FENTANYL CITRATE 0.05 MG/ML IJ SOLN
INTRAMUSCULAR | Status: DC | PRN
  Administered 2013-10-28: 25 ug via INTRAVENOUS
  Administered 2013-10-28: 50 ug via INTRAVENOUS
  Administered 2013-10-28: 25 ug via INTRAVENOUS

## 2013-10-28 MED ORDER — ONDANSETRON HCL 4 MG/2ML IJ SOLN
INTRAMUSCULAR | Status: AC
  Filled 2013-10-28: qty 2

## 2013-10-28 MED ORDER — MIDAZOLAM HCL 2 MG/2ML IJ SOLN
INTRAMUSCULAR | Status: AC
  Filled 2013-10-28: qty 2

## 2013-10-28 MED ORDER — LIDOCAINE HCL 2 % EX GEL
CUTANEOUS | Status: DC | PRN
  Administered 2013-10-28: 1

## 2013-10-28 SURGICAL SUPPLY — 15 items
BAG URINE DRAINAGE (UROLOGICAL SUPPLIES) ×3 IMPLANT
BASKET ZERO TIP NITINOL 2.4FR (BASKET) IMPLANT
BSKT STON RTRVL ZERO TP 2.4FR (BASKET)
CATH INTERMIT  6FR 70CM (CATHETERS) IMPLANT
CATH SILICONE 16FRX5CC (CATHETERS) ×3 IMPLANT
CLOTH BEACON ORANGE TIMEOUT ST (SAFETY) ×3 IMPLANT
DRAPE CAMERA CLOSED 9X96 (DRAPES) ×3 IMPLANT
GLOVE BIOGEL M STRL SZ7.5 (GLOVE) ×3 IMPLANT
GOWN STRL REUS W/TWL XL LVL3 (GOWN DISPOSABLE) IMPLANT
GUIDEWIRE .038 (WIRE) IMPLANT
GUIDEWIRE ANG ZIPWIRE 038X150 (WIRE) IMPLANT
GUIDEWIRE STR DUAL SENSOR (WIRE) ×3 IMPLANT
IV NS IRRIG 3000ML ARTHROMATIC (IV SOLUTION) ×3 IMPLANT
PACK CYSTO (CUSTOM PROCEDURE TRAY) ×3 IMPLANT
SYRINGE 10CC LL (SYRINGE) ×3 IMPLANT

## 2013-10-28 NOTE — Transfer of Care (Signed)
Immediate Anesthesia Transfer of Care Note  Patient: Kara Allen  Procedure(s) Performed: Procedure(s) (LRB): CYSTOSCOPY WITH RETROGRADE PYELOGRAM, CYSTOGRAM (Bilateral)  Patient Location: PACU  Anesthesia Type: General  Level of Consciousness: sedated, patient cooperative and responds to stimulation  Airway & Oxygen Therapy: Patient Spontanous Breathing and Patient connected to face mask oxgen  Post-op Assessment: Report given to PACU RN and Post -op Vital signs reviewed and stable  Post vital signs: Reviewed and stable  Complications: No apparent anesthesia complications

## 2013-10-28 NOTE — Interval H&P Note (Signed)
History and Physical Interval Note:  10/28/2013 10:39 AM  Kara Allen  has presented today for surgery, with the diagnosis of LEFT HYDRONEPHROSIS, LEFT URETEROPELVIC JUNCTION OBSTRUCTION  The various methods of treatment have been discussed with the patient and family. After consideration of risks, benefits and other options for treatment, the patient has consented to  Procedure(s): CYSTOSCOPY WITH RETROGRADE PYELOGRAM, POSSIBLE URETEROSCOPY AND POSSIBLE STENT PLACEMENT (Bilateral) HOLMIUM LASER APPLICATION (Bilateral) as a surgical intervention .  The patient's history has been reviewed, patient examined, no change in status, stable for surgery.  I have reviewed the patient's chart and labs.  Questions were answered to the patient's satisfaction.     Jdyn Parkerson S

## 2013-10-28 NOTE — Anesthesia Postprocedure Evaluation (Signed)
  Anesthesia Post-op Note  Patient: Kara Allen  Procedure(s) Performed: Procedure(s) (LRB): CYSTOSCOPY WITH RETROGRADE PYELOGRAM, CYSTOGRAM (Bilateral)  Patient Location: PACU  Anesthesia Type: General  Level of Consciousness: awake and alert   Airway and Oxygen Therapy: Patient Spontanous Breathing  Post-op Pain: mild  Post-op Assessment: Post-op Vital signs reviewed, Patient's Cardiovascular Status Stable, Respiratory Function Stable, Patent Airway and No signs of Nausea or vomiting  Last Vitals:  Filed Vitals:   10/28/13 1324  BP: 188/85  Pulse: 74  Temp: 36.5 C  Resp:     Post-op Vital Signs: stable   Complications: No apparent anesthesia complications

## 2013-10-28 NOTE — Op Note (Signed)
Preoperative diagnosis: Chronic pyelonephritis, left hydronephrosis Postoperative diagnosis: Same, bilateral vesicoureteral reflux, question mild left UPJ obstruction  Procedure: Cystoscopy, bilateral retrograde pyelography, cystogram   Surgeon: Valetta Fulleravid S. Rosemaria Inabinet M.D.  Anesthesia: Gen.  Indications: This Kara Allen is 33 years of age. She was seen recently with a hospitalization for left flank pain and probable pyelonephritis. Imaging had suggested some left hydronephrosis with questionable calyceal diverticuli/dilation versus early renal abscess formation. There were also inflammatory changes involving the left collecting system and a questionable underlying left UPJ obstruction. She is clinically improved and is now for further assessment of her urinary tract. Of note she is HIV positive.     Technique and findings: Patient was brought to the operating room she had successful induction of general anesthesia. She was placed in lithotomy position and prepped and draped in usual manner. Cystoscopy revealed laterally displaced ureteral orifices that appeared capacious and consistent with findings normally seen with vesicoureteral reflux. Retrograde pyelogram on the right side showed a normal ureter without evidence of obstruction. The calyceal system however did show some dilation and blunting. On the left side there was some mild narrowing near the UPJ. This appeared consistent potentially with some extrinsic compression from a vessel. There was not evidence of a high-grade obstruction. There was mild dilation of the renal pelvis and calyceal system but nothing that appeared to be again significant or high-grade.   Contrast cleared well from the collecting system bilaterally. There was some slight hang-up of contrast in the left renal pelvis. Cystogram was performed and clear evidence of bilateral vesicoureteral reflux was noted. The bladder was then drained and the patient was brought to recovery room in  stable condition.

## 2013-10-28 NOTE — Interval H&P Note (Signed)
History and Physical Interval Note:  10/28/2013 8:38 AM  Kara Allen  has presented today for surgery, with the diagnosis of LEFT HYDRONEPHROSIS, LEFT URETEROPELVIC JUNCTION OBSTRUCTION  The various methods of treatment have been discussed with the patient and family. After consideration of risks, benefits and other options for treatment, the patient has consented to  Procedure(s): CYSTOSCOPY WITH RETROGRADE PYELOGRAM, POSSIBLE URETEROSCOPY AND POSSIBLE STENT PLACEMENT (Bilateral) HOLMIUM LASER APPLICATION (Bilateral) as a surgical intervention .  The patient's history has been reviewed, patient examined, no change in status, stable for surgery.  I have reviewed the patient's chart and labs.  Questions were answered to the patient's satisfaction.     Evaan Tidwell S

## 2013-10-28 NOTE — Progress Notes (Signed)
Pt c/o pain "7" but and requests narcotic perscription for home. Dr Isabel CapriceGrapey notified. Pt aggitated having to stay for clarification.  See orders.

## 2013-10-28 NOTE — H&P (View-Only) (Signed)
Urology Consult   Physician requesting consult: Dr. Kathlen ModyVijaya Akula  Reason for consult: Hydronephrosis  History of Present Illness: Kara Allen is a 33 y.o. with HIV/AIDS presenting with diffuse back and abdominal pain. She reports that she has had this pain off and on for 2 years. Yesterday, her pain was more severe and not relieved by marijuana, which is usually how she deals with the pain. She describes this primarily as suprapubic pain and RLQ/LLQ pain that is essentially constant. She also reports back pain that is non-specific and intermittent. She doesn't point to one area in particular, but says it "hurts all over". She also reports epigastric pain yesterday. She was having subjective fevers but does not have a thermometer. She was having nausea and vomiting yesterday and this morning of thin, clear liquid. She has not been able to eat for the last 24-36 hours. She had mild diarrhea this morning.  At baseline, she reports "a messed up bladder". 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 She endorses frequency, dysuria, and 1 episode of gross hematuria in the past. She is "constantly" on antibiotics. She also has odorless white discharge from the vagina at baseline.   She denies  STDs, urolithiasis, GU malignancy/trauma/surgery.  Past Medical History  Diagnosis Date  . HIV (human immunodeficiency virus infection)     Past Surgical History  Procedure Laterality Date  . Cesarean section       Current Hospital Medications:  Home meds:    Medication List    ASK your doctor about these medications       abacavir 20 MG/ML solution  Commonly known as:  ZIAGEN  Take 30 mLs (600 mg total) by mouth daily.     dolutegravir 50 MG tablet  Commonly known as:  TIVICAY  Take 1 tablet (50 mg total) by mouth daily.     lamiVUDine 10 MG/ML solution  Commonly known as:  EPIVIR  Take 30 mLs (300 mg total) by mouth daily.      rilpivirine 25 MG Tabs tablet  Commonly known as:  EDURANT  Take 1 tablet (25 mg total) by mouth daily with breakfast.        Scheduled Meds: . abacavir  600 mg Oral Q supper  . cefTRIAXone (ROCEPHIN)  IV  1 g Intravenous Q24H  . dolutegravir  50 mg Oral Q supper  . [START ON 10/02/2013] enoxaparin (LOVENOX) injection  40 mg Subcutaneous Q24H  . lamiVUDine  300 mg Oral Q supper  . [START ON 10/02/2013] rilpivirine  25 mg Oral Q supper   Continuous Infusions: . sodium chloride 100 mL/hr at 10/01/13 1227   PRN Meds:.alum & mag hydroxide-simeth, HYDROmorphone (DILAUDID) injection, ondansetron (ZOFRAN) IV, ondansetron, oxyCODONE  Allergies:  Allergies  Allergen Reactions  . Latex Other (See Comments)    Irritates bladder  . Sulfamethoxazole-Trimethoprim Other (See Comments)    Irritates bladder  . Tylenol [Acetaminophen] Other (See Comments)    Makes me pee more    History reviewed. No pertinent family history.  Social History:  reports that she has been smoking Cigarettes.  She has been smoking about 0.00 packs per day. She has never used smokeless tobacco. She reports that she uses illicit drugs (Marijuana) about 5 times per week. She reports that she does not drink alcohol.  ROS: A complete review of systems was performed.  All systems are negative except for pertinent findings as noted.  Physical Exam:  Vital signs in last 24 hours: Temp:  [97.8 F (36.6 C)-99.7 F (37.6 C)] 97.8 F (36.6 C) (10/01 1316) Pulse Rate:  [70-118] 70 (10/01 1316) Resp:  [12-22] 16 (10/01 1316) BP: (99-145)/(64-76) 99/67 mmHg (10/01 1316) SpO2:  [98 %-100 %] 100 % (10/01 1316) Weight:  [62.1 kg (136 lb 14.5 oz)] 62.1 kg (136 lb 14.5 oz) (10/01 0006) Constitutional:  Alert and oriented, No acute distress Cardiovascular: Regular rate and rhythm, No JVD Respiratory: Normal respiratory effort, Lungs clear bilaterally GI: Abdomen is soft, nontender, nondistended, no abdominal masses GU:  Musculoskeletal pain on light palpation bilaterally, R>L. CVA tenderness on the RIGHT, but not the LEFT. Lymphatic: No lymphadenopathy Neurologic: Grossly intact, no focal deficits Psychiatric: Normal mood and affect  Laboratory Data:   Recent Labs  09/30/13 1531 10/01/13 0642  WBC 5.6 3.9*  HGB 10.6* 8.7*  HCT 30.9* 26.4*  PLT 259 215     Recent Labs  09/30/13 1531 10/01/13 0642  NA 133* 132*  K 3.9 3.5*  CL 98 102  GLUCOSE 98 97  BUN 12 9  CALCIUM 9.3 8.3*  CREATININE 0.60 0.55     Results for orders placed during the hospital encounter of 09/30/13 (from the past 24 hour(s))  WET PREP, GENITAL     Status: Abnormal   Collection Time    09/30/13  9:13 PM      Result Value Ref Range   Yeast Wet Prep HPF POC NONE SEEN  NONE SEEN   Trich, Wet Prep MANY (*) NONE SEEN   Clue Cells Wet Prep HPF POC FEW (*) NONE SEEN   WBC, Wet Prep HPF POC TOO NUMEROUS TO COUNT (*) NONE SEEN  GC/CHLAMYDIA PROBE AMP     Status: None   Collection Time    09/30/13  9:13 PM      Result Value Ref Range   CT Probe RNA NEGATIVE  NEGATIVE   GC Probe RNA NEGATIVE  NEGATIVE  MRSA PCR SCREENING     Status: None   Collection Time    10/01/13 12:07 AM      Result Value Ref Range   MRSA by PCR NEGATIVE  NEGATIVE  BASIC METABOLIC PANEL     Status: Abnormal   Collection Time    10/01/13  6:42 AM      Result Value Ref Range   Sodium 132 (*) 137 - 147 mEq/L   Potassium 3.5 (*) 3.7 - 5.3 mEq/L   Chloride 102  96 - 112 mEq/L   CO2 21  19 - 32 mEq/L   Glucose, Bld 97  70 - 99 mg/dL   BUN 9  6 - 23 mg/dL   Creatinine, Ser 1.610.55  0.50 - 1.10 mg/dL   Calcium 8.3 (*) 8.4 - 10.5 mg/dL   GFR calc non Af Amer >90  >90 mL/min   GFR calc Af Amer >90  >90 mL/min   Anion gap 9  5 - 15  CBC     Status: Abnormal   Collection Time    10/01/13  6:42 AM      Result Value Ref Range   WBC 3.9 (*) 4.0 - 10.5 K/uL   RBC 2.89 (*) 3.87 - 5.11 MIL/uL   Hemoglobin 8.7 (*) 12.0 - 15.0 g/dL   HCT 09.626.4 (*) 04.536.0 -  46.0 %   MCV 91.3  78.0 - 100.0 fL   MCH 30.1  26.0 - 34.0 pg   MCHC 33.0  30.0 - 36.0 g/dL  RDW 16.0 (*) 11.5 - 15.5 %   Platelets 215  150 - 400 K/uL   Recent Results (from the past 240 hour(s))  WET PREP, GENITAL     Status: Abnormal   Collection Time    09/30/13  9:13 PM      Result Value Ref Range Status   Yeast Wet Prep HPF POC NONE SEEN  NONE SEEN Final   Trich, Wet Prep MANY (*) NONE SEEN Final   Clue Cells Wet Prep HPF POC FEW (*) NONE SEEN Final   WBC, Wet Prep HPF POC TOO NUMEROUS TO COUNT (*) NONE SEEN Final  GC/CHLAMYDIA PROBE AMP     Status: None   Collection Time    09/30/13  9:13 PM      Result Value Ref Range Status   CT Probe RNA NEGATIVE  NEGATIVE Final   GC Probe RNA NEGATIVE  NEGATIVE Final   Comment: (NOTE)                                                                                               **Normal Reference Range: Negative**          Assay performed using the Gen-Probe APTIMA COMBO2 (R) Assay.     Acceptable specimen types for this assay include APTIMA Swabs (Unisex,     endocervical, urethral, or vaginal), first void urine, and ThinPrep     liquid based cytology samples.     Performed at [facility]  MRSA PCR SCREENING     Status: None   Collection Time    10/01/13 12:07 AM      Result Value Ref Range Status   MRSA by PCR NEGATIVE  NEGATIVE Final   Comment:            The GeneXpert MRSA Assay (FDA     approved for NASAL specimens     only), is one component of a     comprehensive MRSA colonization     surveillance program. It is not     intended to diagnose MRSA     infection nor to guide or     monitor treatment for     MRSA infections.    Renal Function:  Recent Labs  09/30/13 1531 10/01/13 0642  CREATININE 0.60 0.55   Estimated Creatinine Clearance: 86.1 ml/min (by C-G formula based on Cr of 0.55).  Radiologic Imaging: Ct Abdomen Pelvis W Contrast  09/30/2013   CLINICAL DATA:  Mid and lower back and abdominal  pain intermittently ; vomiting without diarrhea; on medication for age by the  EXAM: CT ABDOMEN AND PELVIS WITH CONTRAST  TECHNIQUE: Multidetector CT imaging of the abdomen and pelvis was performed using the standard protocol following bolus administration of intravenous contrast.  CONTRAST:  80mL OMNIPAQUE IOHEXOL 300 MG/ML SOLN intravenously. The patient also received oral contrast material.  COMPARISON:  None.  FINDINGS: The left kidney is enlarged and contains multiple hypodensities which communicate with one-another which exhibit HU IUD of between +2 and +25. There is left mild hydronephrosis and proximal hydroureter without a definite obstructing stone. On the right there  is minimal hypodensity in the upper pole. The perinephric fat exhibits normal density bilaterally. The urinary bladder is only partially distended.  The liver contains calcifications consistent with previous granulomatous infection. There is no focal mass or ductal dilation. The gallbladder, spleen, and moderately distended stomach are grossly normal. There is mild ductal prominence throughout the course of the pancreas without evidence of masses or inflammation. The adrenal glands are normal in size.  There is an enlarged lymph node medial to the left kidney which measures 2.8 x 2.3 x 3.3 cm. There are additional enlarged periaortic and pericaval lymph nodes. The caliber of the abdominal aorta is normal. Evaluation of the small and large bowel is limited in that the contrast has reached only the proximal small bowel. There is no obstructive pattern. One cannot exclude mural thickening especially in the ascending colon.  Within the pelvis there is a left adnexal fluid collection. Which measures 4.6 x 2.7 cm. The uterus is unremarkable. There is no right adnexal mass. There are numerous normal-sized to borderline enlarged pelvic lymph nodes. There is no inguinal nor umbilical hernia.  The lung bases exhibit no acute abnormalities. The lumbar  spine and bony pelvis are unremarkable.  IMPRESSION: 1. The left kidney is enlarged and exhibits multiple low-density areas which communicate with one-another which may reflect cysts, abscesses, or chronic caliectasis. There is also mild left hydronephrosis without obvious etiology. Renal ultrasound may be useful in better characterizing the low-density regions as simple cystic or non simple cystic structures. Thereis a similar appearing finding in the upper pole of the right kidney. There are mildly enlarged periaortic and pericaval and left perirenal lymph nodes. 2. There is no acute hepatobiliary abnormality. There is mild ductal prominence of the otherwise normal-appearing pancreas. 3. No acute bowel abnormality is demonstrated. 4. There is a cystic appearing left adnexal process with HU value of +17. Pelvic ultrasound is recommended to further evaluate this region if the patient is having any symptoms referable to the pelvis.   Electronically Signed   By: Shannan Slinker  [place]   On: 09/30/2013 20:56   US Renal  10/01/2013   CLINICAL DATA:  Followup renal cysts.  EXAM: RENAL/URINARY TRACT ULTRASOUND COMPLETE  COMPARISON:  CT scan 09/30/2013.  FINDINGS: Right Kidney:  Length: 14.4 cm. Normal renal cortical thickness. Mild increased echogenicity (similar to liver). No focal lesions or hydronephrosis. Slightly prominent extra renal pelvis.  Left Kidney:  Length: 14.1 cm. Diffuse increased echogenicity with hydronephrosis and upper left hydroureter. Based on the CT scan there appears to be a an area of strictured narrowing or E UPJ obstruction best seen non the CT scan on image number 45. Also, base of the CT scan the collecting system centrally is not significantly dilated. There is wall thickening of the collecting system and I suspect there are areas of strictured narrowing with dilated/obstructed the calices and possible cavus seal diverticuli. On the ultrasound the fluid in the dilated collecting system appears  complex.  Bladder:  Appears normal for degree of bladder distention.  IMPRESSION: 1. Suspect chronic inflammatory or infectious changes involving the left kidney, mainly based on the prior CT scan. I suspect there are areas of strictured narrowing involving the collecting system with dilated obstructed calices and or possible caliceal diverticulum. 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 2. The right kidney also demonstrates some chronic inflammatory changes but no hydronephrosis or UPJ obstruction. The right ureteral wall appears  thickened on the prior CT scan. 3. The retroperitoneal lymphadenopathy seen on the CT scan is probably explained by chronic inflammation or infection of the kidneys. 4. I would recommend a urology consultation. I think the next best test would either be retrograde ureteropyelograms or possibly an MRI abdomen without and with contrast.   Electronically Signed   By: Loralie Champagne M.D.   On: 10/01/2013 09:03    I independently reviewed the above imaging studies, along with Dr. Isabel Caprice.  Impression/Recommendation: 21F with acute worsening of her chronic diffuse, non-specific abdominal pain and back pain, nausea and vomiting. CT scan with concern for a left proximal ureteral stricture and dilation of her left renal calyces vs calyceal diverticula, as well as inflamed appearing collecting system and complex-appearing fluid in the calyces. It is possible that she has a chronic obstruction of the left kidney and it's also possible that she has an infection, but her symptoms are very non-specific for pyelonephritis/obstruction. Without a fever, and unstable vitals, it is unclear whether draining her kidney is going to help her clinically. Give her HIV/AIDS status, her normal WBC is hard to draw any conclusions from. Her kidney function is normal. This is potentially a lower urinary tract infection, however without a urine  culture, it is difficult to say for sure.    1. Recommend a catheterized urine culture given prior contaminated samples and possible an ongoing vaginal infection. 2. Treat with empiric antibiotics and monitor clinically. If she improves as expected and remains stable, she will be seen as an outpatient by urology and set up for a retrograde pyelogram when it is clear she does not have a urinary tract infection. 3. If she spikes a fever or her vitals trend in the wrong direction and she becomes unstable, or if she does not improve clinically as expect, left percutaneous nephrotomy tube is the best way to drain her kidney as this will allow subsequent antegrade nephrostogram to delineate whether she has a stricture and the degree of obstruction without an additional anesthetic.  Urology will follow. Thank you for this consult.    Discussed with Dr. Isabel Caprice who agrees. I performed a history and physical examination of the patient and discussed his management with the resident.  I reviewed the resident's note and agree with the documented findings and plan of care.

## 2013-10-28 NOTE — Discharge Instructions (Signed)

## 2013-10-28 NOTE — Anesthesia Preprocedure Evaluation (Signed)
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    Pulmonary Current Smoker,  breath sounds clear to auscultation  Pulmonary exam normal       Cardiovascular negative cardio ROS  Rhythm:Regular Rate:Normal     Neuro/Psych PSYCHIATRIC DISORDERS Anxiety Depression negative neurological ROS     GI/Hepatic negative GI ROS, Neg liver ROS,   Endo/Other  negative endocrine ROS  Renal/GU Renal disease  negative genitourinary   Musculoskeletal negative musculoskeletal ROS (+)   Abdominal   Peds negative pediatric ROS (+)  Hematology negative hematology ROS (+)   Anesthesia Other Findings   Reproductive/Obstetrics negative OB ROS                             Anesthesia Physical Anesthesia Plan  ASA: II  Anesthesia Plan: General   Post-op Pain Management:    Induction: Intravenous  Airway Management Planned: LMA  Additional Equipment:   Intra-op Plan:   Post-operative Plan: Extubation in OR  Informed Consent: I have reviewed the patients History and Physical, chart, labs and discussed the procedure including the risks, benefits and alternatives for the proposed anesthesia with the patient or authorized representative who has indicated his/her understanding and acceptance.   Dental advisory given  Plan Discussed with: CRNA  Anesthesia Plan Comments:         Anesthesia Quick Evaluation

## 2013-10-29 ENCOUNTER — Encounter (HOSPITAL_COMMUNITY): Payer: Self-pay | Admitting: Urology

## 2013-11-02 ENCOUNTER — Encounter (HOSPITAL_COMMUNITY): Payer: Self-pay | Admitting: Urology

## 2013-11-16 ENCOUNTER — Other Ambulatory Visit: Payer: Self-pay

## 2013-11-16 ENCOUNTER — Encounter (HOSPITAL_COMMUNITY): Payer: Self-pay | Admitting: *Deleted

## 2013-11-16 ENCOUNTER — Inpatient Hospital Stay (HOSPITAL_COMMUNITY)
Admission: EM | Admit: 2013-11-16 | Discharge: 2013-11-19 | DRG: 690 | Disposition: A | Discharge status: Home / Self Care | Payer: Medicaid Other | Attending: Internal Medicine | Admitting: Internal Medicine

## 2013-11-16 DIAGNOSIS — Z21 Asymptomatic human immunodeficiency virus [HIV] infection status: Secondary | ICD-10-CM | POA: Diagnosis present

## 2013-11-16 DIAGNOSIS — A599 Trichomoniasis, unspecified: Secondary | ICD-10-CM | POA: Diagnosis present

## 2013-11-16 DIAGNOSIS — Z882 Allergy status to sulfonamides status: Secondary | ICD-10-CM

## 2013-11-16 DIAGNOSIS — F32A Depression, unspecified: Secondary | ICD-10-CM | POA: Diagnosis present

## 2013-11-16 DIAGNOSIS — F329 Major depressive disorder, single episode, unspecified: Secondary | ICD-10-CM | POA: Diagnosis present

## 2013-11-16 DIAGNOSIS — N8320 Unspecified ovarian cysts: Secondary | ICD-10-CM | POA: Diagnosis present

## 2013-11-16 DIAGNOSIS — G3184 Mild cognitive impairment, so stated: Secondary | ICD-10-CM | POA: Diagnosis present

## 2013-11-16 DIAGNOSIS — K219 Gastro-esophageal reflux disease without esophagitis: Secondary | ICD-10-CM | POA: Diagnosis present

## 2013-11-16 DIAGNOSIS — F70 Mild intellectual disabilities: Secondary | ICD-10-CM | POA: Diagnosis present

## 2013-11-16 DIAGNOSIS — M549 Dorsalgia, unspecified: Secondary | ICD-10-CM | POA: Insufficient documentation

## 2013-11-16 DIAGNOSIS — Z22330 Carrier of Group B streptococcus: Secondary | ICD-10-CM

## 2013-11-16 DIAGNOSIS — B20 Human immunodeficiency virus [HIV] disease: Secondary | ICD-10-CM | POA: Diagnosis present

## 2013-11-16 DIAGNOSIS — N1370 Vesicoureteral-reflux, unspecified: Secondary | ICD-10-CM | POA: Diagnosis present

## 2013-11-16 DIAGNOSIS — F17210 Nicotine dependence, cigarettes, uncomplicated: Secondary | ICD-10-CM | POA: Diagnosis present

## 2013-11-16 DIAGNOSIS — N12 Tubulo-interstitial nephritis, not specified as acute or chronic: Principal | ICD-10-CM | POA: Diagnosis present

## 2013-11-16 DIAGNOSIS — A491 Streptococcal infection, unspecified site: Secondary | ICD-10-CM | POA: Insufficient documentation

## 2013-11-16 DIAGNOSIS — N1330 Unspecified hydronephrosis: Secondary | ICD-10-CM | POA: Diagnosis present

## 2013-11-16 LAB — CBC WITH DIFFERENTIAL/PLATELET
BASOS ABS: 0 10*3/uL (ref 0.0–0.1)
BASOS PCT: 0 % (ref 0–1)
EOS PCT: 1 % (ref 0–5)
Eosinophils Absolute: 0 10*3/uL (ref 0.0–0.7)
HCT: 34.1 % — ABNORMAL LOW (ref 36.0–46.0)
HEMOGLOBIN: 11.3 g/dL — AB (ref 12.0–15.0)
LYMPHS PCT: 23 % (ref 12–46)
Lymphs Abs: 1.2 10*3/uL (ref 0.7–4.0)
MCH: 30.5 pg (ref 26.0–34.0)
MCHC: 33.1 g/dL (ref 30.0–36.0)
MCV: 91.9 fL (ref 78.0–100.0)
MONO ABS: 0.5 10*3/uL (ref 0.1–1.0)
MONOS PCT: 10 % (ref 3–12)
NEUTROS ABS: 3.3 10*3/uL (ref 1.7–7.7)
Neutrophils Relative %: 66 % (ref 43–77)
Platelets: 317 10*3/uL (ref 150–400)
RBC: 3.71 MIL/uL — ABNORMAL LOW (ref 3.87–5.11)
RDW: 13.7 % (ref 11.5–15.5)
WBC: 5 10*3/uL (ref 4.0–10.5)

## 2013-11-16 LAB — URINE MICROSCOPIC-ADD ON

## 2013-11-16 LAB — URINALYSIS, ROUTINE W REFLEX MICROSCOPIC
Bilirubin Urine: NEGATIVE
Glucose, UA: NEGATIVE mg/dL
Ketones, ur: NEGATIVE mg/dL
Nitrite: POSITIVE — AB
PH: 6 (ref 5.0–8.0)
PROTEIN: 100 mg/dL — AB
Specific Gravity, Urine: 1.015 (ref 1.005–1.030)
Urobilinogen, UA: 0.2 mg/dL (ref 0.0–1.0)

## 2013-11-16 LAB — COMPREHENSIVE METABOLIC PANEL
ALBUMIN: 2.8 g/dL — AB (ref 3.5–5.2)
ALK PHOS: 101 U/L (ref 39–117)
ALT: 18 U/L (ref 0–35)
AST: 29 U/L (ref 0–37)
Anion gap: 14 (ref 5–15)
BILIRUBIN TOTAL: 0.3 mg/dL (ref 0.3–1.2)
BUN: 7 mg/dL (ref 6–23)
CHLORIDE: 96 meq/L (ref 96–112)
CO2: 22 mEq/L (ref 19–32)
Calcium: 9.4 mg/dL (ref 8.4–10.5)
Creatinine, Ser: 0.57 mg/dL (ref 0.50–1.10)
GFR calc Af Amer: >90 mL/min (ref >90)
GFR calc non Af Amer: >90 mL/min (ref >90)
Glucose, Bld: 96 mg/dL (ref 70–99)
Potassium: 4 mEq/L (ref 3.7–5.3)
SODIUM: 132 meq/L — AB (ref 137–147)
TOTAL PROTEIN: 10.2 g/dL — AB (ref 6.0–8.3)

## 2013-11-16 LAB — LIPASE, BLOOD: Lipase: 31 U/L (ref 11–59)

## 2013-11-16 LAB — PREGNANCY, URINE: Preg Test, Ur: NEGATIVE

## 2013-11-16 MED ORDER — HYDROMORPHONE HCL 1 MG/ML IJ SOLN
1.0000 mg | Freq: Once | INTRAMUSCULAR | Status: AC
  Administered 2013-11-16: 1 mg via INTRAVENOUS
  Filled 2013-11-16: qty 1

## 2013-11-16 MED ORDER — SODIUM CHLORIDE 0.9 % IV BOLUS (SEPSIS)
1000.0000 mL | Freq: Once | INTRAVENOUS | Status: AC
  Administered 2013-11-16: 1000 mL via INTRAVENOUS

## 2013-11-16 MED ORDER — DEXTROSE 5 % IV SOLN
1.0000 g | Freq: Once | INTRAVENOUS | Status: AC
  Administered 2013-11-17: 1 g via INTRAVENOUS
  Filled 2013-11-16: qty 10

## 2013-11-16 NOTE — Telephone Encounter (Signed)
Patient called requesting pain medication. States she does not have a PCP, though it states on her medicaid card that clinic is her PCP and states she does not want to go to the ED because she has children at home. Attempted to contact patient. No answer. Left message stating we are returning your call, call clinic.

## 2013-11-16 NOTE — Telephone Encounter (Signed)
Patient called back, states that she needs rx for pain medicine.

## 2013-11-16 NOTE — Telephone Encounter (Signed)
Called pt and left message that our office cannot prescribe pain medication for her at this time. She will need to contact the office which performed her most recent procedure. If her pain is not related to that procedure, she will require an evaluation in order to determine the cause of her pain and proper treatment. She should go to ED or [facility] - whichever is appropriate for her pain.

## 2013-11-16 NOTE — ED Notes (Signed)
Per EMS: pt coming from home with c/o intermittent lower abdominal pain radiating to lower back. Pt reports intermittent hematuria. Pt denies loss of bowel or bladder. Pt reports pain for three months. Pt states she was dx with UTI/kidney infection recently. Pt was prescribed abx, states she completed full dose. Last BM today and normal.

## 2013-11-16 NOTE — ED Provider Notes (Signed)
CSN: [id number]     Arrival date & time 11/16/13  1952 History   First MD Initiated Contact with Patient 11/16/13 2302     Chief Complaint  Patient presents with  . Abdominal Pain  . Back Pain     (Consider location/radiation/quality/duration/timing/severity/associated sxs/prior Treatment) HPI  Pt presenting with c/o bilateral low back pain diffuse abdominal pain with dysuria, intermittent blood in urine.  She has hx of HIV and recent CD4 counts have been 130 and 170 (last on 10/15/13).  She had been feeling improved over the past several weeks and was seen by Dr. Isabel CapriceGrapey, urology who did a cystoscopy and found her to have vesicoureteral reflux.  Over the approximate last week she has begun to have similar symptoms to her prior UTIs- including bilateral low back pain and abdominal pain as above.  She is taking HAART therapy and was seen by ID on 10/15 where her CD4 count was found to remain low at 170.  No fever/chills.  No vomiting or change in stools.  Denies vaginal discharge.  There are no other associated systemic symptoms, there are no other alleviating or modifying factors.   Past Medical History  Diagnosis Date  . HIV (human immunodeficiency virus infection)   . Trichomonas infection   . Pyelonephritis   . Recurrent boils   . Urinary frequency     and nocturia  . Hydronephrosis, left   . Cyst of ovary     PT STATES SHE IS TAKING ANTIBIOTICS FOR CYST ON OVARY   Past Surgical History  Procedure Laterality Date  . Cesarean section  2006  . Cystoscopy with retrograde pyelogram, ureteroscopy and stent placement Bilateral 10/28/2013    Procedure: CYSTOSCOPY WITH RETROGRADE PYELOGRAM, CYSTOGRAM;  Surgeon: Valetta Fulleravid S Grapey, MD;  Location: [facility];  Service: Urology;  Laterality: Bilateral;   History reviewed. No pertinent family history. History  Substance Use Topics  . Smoking status: Current Every Day Smoker -- 0.15 packs/day for 14 years    Types: Cigarettes    Last Attempt to  Quit: 06/13/2013  . Smokeless tobacco: Never Used     Comment: smoking 1-2 black and milds per day  . Alcohol Use: No     Comment: no longer smokes cigarettes - does smoke tobacco product called blackmilds   OB History    Gravida Para Term Preterm AB TAB SAB Ectopic Multiple Living   2 1 1  1  1   1      Review of Systems  ROS reviewed and all otherwise negative except for mentioned in HPI    Allergies  Advil; Latex; Sulfamethoxazole-trimethoprim; and Tylenol  Home Medications   Prior to Admission medications   Medication Sig Start Date End Date Taking? Authorizing Provider  dolutegravir (TIVICAY) 50 MG tablet Take 50 mg by mouth at bedtime.   Yes Historical Provider, MD  lamiVUDine (EPIVIR) 10 MG/ML solution Take 300 mg by mouth at bedtime.   Yes Historical Provider, MD  rilpivirine (EDURANT) 25 MG TABS tablet Take 25 mg by mouth at bedtime.   Yes Historical Provider, MD  doxycycline (VIBRAMYCIN) 100 MG capsule Take 1 capsule (100 mg total) by mouth 2 (two) times daily. 10/13/13   Aviva SignsMarie L Williams, CNM  metroNIDAZOLE (FLAGYL) 500 MG tablet Take 500 mg by mouth 2 (two) times daily.    Historical Provider, MD   BP 103/67 mmHg  Pulse 93  Temp(Src) 98 F (36.7 C) (Oral)  Resp 16  Ht 5\' 1"  (1.549 m)  Wt 140 lb (63.504 kg)  BMI 26.47 kg/m2  SpO2 99%  LMP 11/09/2013  Vitals reviewed Physical Exam  Physical Examination: General appearance - alert, uncomfortable appearing, and in no distress Mental status - alert, oriented to person, place, and time Eyes - no conjunctival injection, no scleral icterus Mouth - mucous membranes moist, pharynx normal without lesions Chest - clear to auscultation, no wheezes, rales or rhonchi, symmetric air entry Heart - normal rate, regular rhythm, normal S1, S2, no murmurs, rubs, clicks or gallops Abdomen - soft, diffusely tender to palpation, no gaurding or rebound, nondistended, no masses or organomegaly, nabs Back exam - full range of motion,  no midline tenderness to palpation, bilateral CVA tenderness Extremities - peripheral pulses normal, no pedal edema, no clubbing or cyanosis Skin - normal coloration and turgor, no rashes  ED Course  Procedures (including critical care time) Labs Review Labs Reviewed  COMPREHENSIVE METABOLIC PANEL - Abnormal; Notable for the following:    Sodium 132 (*)    Total Protein 10.2 (*)    Albumin 2.8 (*)    All other components within normal limits  CBC WITH DIFFERENTIAL - Abnormal; Notable for the following:    RBC 3.71 (*)    Hemoglobin 11.3 (*)    HCT 34.1 (*)    All other components within normal limits  URINALYSIS, ROUTINE W REFLEX MICROSCOPIC - Abnormal; Notable for the following:    APPearance TURBID (*)    Hgb urine dipstick LARGE (*)    Protein, ur 100 (*)    Nitrite POSITIVE (*)    Leukocytes, UA LARGE (*)    All other components within normal limits  URINE MICROSCOPIC-ADD ON - Abnormal; Notable for the following:    Bacteria, UA FEW (*)    All other components within normal limits  CULTURE, BLOOD (ROUTINE X 2)  CULTURE, BLOOD (ROUTINE X 2)  URINE CULTURE  LIPASE, BLOOD  PREGNANCY, URINE    Imaging Review No results found.   EKG Interpretation None      MDM   Final diagnoses:  Pyelonephritis  HIV (human immunodeficiency virus infection)    Pt presenting with low back pain, diffuse abdominal pain and dysuria.  She has hx of HIV with CD4 count 170 (10/15/13).  She has hx of VUR and prior UTis.  Workup reveals UA with nitrite positive, large leuks, other labs reassuring.  Pt started on IV rocephin, IV fluids and given pain control.  Will need admission due to immunosuppresion.  D/w Dr. Clyde LundborgNiu, [facility] who will admit the patient for further management.   Nursing notes including past medical history and social history reviewed and considered in documentation Prior records reviewed and considered during this visit     Ethelda ChickMartha K Linker, MD 11/17/13 [phone]

## 2013-11-17 ENCOUNTER — Inpatient Hospital Stay (HOSPITAL_COMMUNITY): Payer: Medicaid Other

## 2013-11-17 DIAGNOSIS — F329 Major depressive disorder, single episode, unspecified: Secondary | ICD-10-CM | POA: Diagnosis present

## 2013-11-17 DIAGNOSIS — R109 Unspecified abdominal pain: Secondary | ICD-10-CM | POA: Diagnosis present

## 2013-11-17 DIAGNOSIS — F17210 Nicotine dependence, cigarettes, uncomplicated: Secondary | ICD-10-CM | POA: Diagnosis present

## 2013-11-17 DIAGNOSIS — G3184 Mild cognitive impairment, so stated: Secondary | ICD-10-CM | POA: Diagnosis present

## 2013-11-17 DIAGNOSIS — Z21 Asymptomatic human immunodeficiency virus [HIV] infection status: Secondary | ICD-10-CM | POA: Diagnosis present

## 2013-11-17 DIAGNOSIS — A599 Trichomoniasis, unspecified: Secondary | ICD-10-CM | POA: Diagnosis present

## 2013-11-17 DIAGNOSIS — Z22330 Carrier of Group B streptococcus: Secondary | ICD-10-CM | POA: Diagnosis not present

## 2013-11-17 DIAGNOSIS — K219 Gastro-esophageal reflux disease without esophagitis: Secondary | ICD-10-CM | POA: Diagnosis present

## 2013-11-17 DIAGNOSIS — N12 Tubulo-interstitial nephritis, not specified as acute or chronic: Secondary | ICD-10-CM | POA: Diagnosis not present

## 2013-11-17 DIAGNOSIS — F70 Mild intellectual disabilities: Secondary | ICD-10-CM | POA: Diagnosis present

## 2013-11-17 DIAGNOSIS — N1370 Vesicoureteral-reflux, unspecified: Secondary | ICD-10-CM

## 2013-11-17 DIAGNOSIS — N1330 Unspecified hydronephrosis: Secondary | ICD-10-CM | POA: Diagnosis present

## 2013-11-17 DIAGNOSIS — Z882 Allergy status to sulfonamides status: Secondary | ICD-10-CM | POA: Diagnosis not present

## 2013-11-17 DIAGNOSIS — N8320 Unspecified ovarian cysts: Secondary | ICD-10-CM | POA: Diagnosis present

## 2013-11-17 LAB — CBC
HEMATOCRIT: 28.8 % — AB (ref 36.0–46.0)
HEMOGLOBIN: 9.4 g/dL — AB (ref 12.0–15.0)
MCH: 29.3 pg (ref 26.0–34.0)
MCHC: 32.6 g/dL (ref 30.0–36.0)
MCV: 89.7 fL (ref 78.0–100.0)
Platelets: 264 10*3/uL (ref 150–400)
RBC: 3.21 MIL/uL — ABNORMAL LOW (ref 3.87–5.11)
RDW: 13.7 % (ref 11.5–15.5)
WBC: 4.8 10*3/uL (ref 4.0–10.5)

## 2013-11-17 LAB — BASIC METABOLIC PANEL
Anion gap: 13 (ref 5–15)
BUN: 6 mg/dL (ref 6–23)
CALCIUM: 8.7 mg/dL (ref 8.4–10.5)
CO2: 21 mEq/L (ref 19–32)
CREATININE: 0.52 mg/dL (ref 0.50–1.10)
Chloride: 99 mEq/L (ref 96–112)
GFR calc Af Amer: >90 mL/min (ref >90)
GFR calc non Af Amer: >90 mL/min (ref >90)
Glucose, Bld: 135 mg/dL — ABNORMAL HIGH (ref 70–99)
Potassium: 3.4 mEq/L — ABNORMAL LOW (ref 3.7–5.3)
Sodium: 133 mEq/L — ABNORMAL LOW (ref 137–147)

## 2013-11-17 MED ORDER — SODIUM CHLORIDE 0.9 % IV SOLN
INTRAVENOUS | Status: DC
  Administered 2013-11-17 – 2013-11-18 (×6): via INTRAVENOUS

## 2013-11-17 MED ORDER — CEFTRIAXONE SODIUM IN DEXTROSE 20 MG/ML IV SOLN
1.0000 g | INTRAVENOUS | Status: DC
  Administered 2013-11-17: 1 g via INTRAVENOUS
  Filled 2013-11-17 (×2): qty 50

## 2013-11-17 MED ORDER — LAMIVUDINE 10 MG/ML PO SOLN
300.0000 mg | Freq: Every day | ORAL | Status: DC
  Administered 2013-11-17 – 2013-11-18 (×3): 300 mg via ORAL
  Filled 2013-11-17 (×4): qty 30

## 2013-11-17 MED ORDER — HYDROMORPHONE HCL 1 MG/ML IJ SOLN
1.0000 mg | Freq: Once | INTRAMUSCULAR | Status: AC
  Administered 2013-11-17: 1 mg via INTRAVENOUS
  Filled 2013-11-17: qty 1

## 2013-11-17 MED ORDER — ONDANSETRON HCL 4 MG/2ML IJ SOLN
4.0000 mg | Freq: Four times a day (QID) | INTRAMUSCULAR | Status: DC | PRN
  Administered 2013-11-17 – 2013-11-18 (×4): 4 mg via INTRAVENOUS
  Filled 2013-11-17 (×4): qty 2

## 2013-11-17 MED ORDER — HEPARIN SODIUM (PORCINE) 5000 UNIT/ML IJ SOLN
5000.0000 [IU] | Freq: Three times a day (TID) | INTRAMUSCULAR | Status: DC
  Administered 2013-11-17 – 2013-11-19 (×7): 5000 [IU] via SUBCUTANEOUS
  Filled 2013-11-17 (×7): qty 1

## 2013-11-17 MED ORDER — SODIUM CHLORIDE 0.9 % IV BOLUS (SEPSIS)
500.0000 mL | Freq: Once | INTRAVENOUS | Status: AC
  Administered 2013-11-17: 500 mL via INTRAVENOUS

## 2013-11-17 MED ORDER — ONDANSETRON HCL 4 MG PO TABS: 4.0000 mg | ORAL_TABLET | Freq: Four times a day (QID) | ORAL | Status: DC | PRN

## 2013-11-17 MED ORDER — DOLUTEGRAVIR SODIUM 50 MG PO TABS
50.0000 mg | ORAL_TABLET | Freq: Every day | ORAL | Status: DC
  Administered 2013-11-17 – 2013-11-18 (×3): 50 mg via ORAL
  Filled 2013-11-17 (×3): qty 1

## 2013-11-17 MED ORDER — MORPHINE SULFATE 2 MG/ML IJ SOLN
1.0000 mg | INTRAMUSCULAR | Status: DC | PRN
  Administered 2013-11-17 – 2013-11-18 (×9): 1 mg via INTRAVENOUS
  Filled 2013-11-17 (×9): qty 1

## 2013-11-17 MED ORDER — OXYCODONE HCL 5 MG PO TABS: 10.0000 mg | ORAL_TABLET | Freq: Four times a day (QID) | ORAL | Status: DC | PRN

## 2013-11-17 MED ORDER — OXYCODONE HCL 5 MG PO TABS
10.0000 mg | ORAL_TABLET | Freq: Four times a day (QID) | ORAL | Status: DC | PRN
  Administered 2013-11-17: 10 mg via ORAL
  Filled 2013-11-17: qty 2

## 2013-11-17 MED ORDER — RILPIVIRINE HCL 25 MG PO TABS
25.0000 mg | ORAL_TABLET | Freq: Every day | ORAL | Status: DC
  Administered 2013-11-17 – 2013-11-18 (×3): 25 mg via ORAL
  Filled 2013-11-17 (×4): qty 1

## 2013-11-17 NOTE — Progress Notes (Signed)
Patient has Medicaid as insurance coverage which list her PCP/ Access to medical care   Primary Care Provider: [facility]     Primary Care Provider: [facility]  Address: [address] [place], [place] [id number]  Contact: [facility]  Telephone: [phone]  Telephone: [phone]  Contact: [facility] [place]  Work Phone: [phone]

## 2013-11-17 NOTE — Progress Notes (Signed)
CARE MANAGEMENT NOTE 11/17/2013  Patient:  Kara Allen,Kara Allen   Account Number:  [id number]  Date Initiated:  11/17/2013  Documentation initiated by:  Jiles CrockerHANDLER,Vedder Brittian  Subjective/Objective Assessment:   ADMITTED WITH PYELONEPHRITIS     Action/Plan:   CM FOLLOWING FOR DCP   Anticipated DC Date:  11/21/2013   Anticipated DC Plan:  HOME/SELF CARE      DC Planning Services  CM consult         Status of service:  In process, will continue to follow Per UR Regulation:  Reviewed for med. necessity/level of care/duration of stay  Comments:  11/17/2015Abelino Allen- B Kara Scinto RN,BSN,MHA [phone]

## 2013-11-17 NOTE — Progress Notes (Signed)
Patient resistant to IV bolus stating "thats just gonna make my BP lower". Educated patient to Order for bolus and rationale. After extensive reassurance and encouragement she agreed to fluid bolus.

## 2013-11-17 NOTE — Progress Notes (Signed)
Patient arrived from ED to 4N09 alert and oriented at 0100.

## 2013-11-17 NOTE — Progress Notes (Signed)
Patient admitted after midnight- please see H&P.  Patient states she can not swallow pills so she will be given IV pain meds.  Pyelonephritis: patient has recurrent pyelonephritis, most likely secondary to vesico-ureteral reflex currently patient is hemodynamically stable. Clinically not septic. - IVF - Ceftriaxone IV (old cultures unrevealing of bacteria and sensitivities) - Follow up results of urine and blood cx and amend antibiotic regimen if needed per sensitivity results - zofran for nausea - May follow up with urology at discharge  HIV: well controlled. CD4 170 and VL>20 on 10/15/13 -continue home medications  Depression: stable. Not on medications at home. No suicidal or homicidal ideations. - Observe closely.  Marlin CanaryJessica Yahel Fuston DO

## 2013-11-17 NOTE — H&P (Signed)
[facility] History and Physical  Kara PresserSharon D Kilroy ZOX:[id number] DOB: 11/10/1980 DOA: 11/16/2013  Referring physician: ED physician PCP: No PCP Per Patient  Specialists:   Chief Complaint: Abdominal pain, dysuria, urinary frequency and urgency.  HPI: Kara Allen is a 33 y.o. female with past medical history of HIV, depression, mild mental retardation, vesico-ureteral reflex, who presents with abdominal pain, dysuria, urinary frequency and urgency.   Patient was recently hospitalized and treated with antibiotics because of pyelonephritis from 9/30-10/3 . Urology was consulted. In the following up visit on 10/28, Dr. Isabel CapriceGrapey did cystoscopy, bilateral retrograde pyelography and cystogram for her, which showed bilateral vesicoureteral reflux and some mild narrowing near the UPJ, but without evidence of a high-grade obstruction per Dr. Tawanna CoolerGrepey's note. Over the approximate last week she has begun to have similar symptoms to her prior UTIs, including bilateral low back pain, dysuria, urinary frequency and urgency. She also has mild nausea, but no diarrhea. Patient does not have cough, fever, chills, chest pain, rashes.   Work up in the ED demonstrates positive UA for UTI. No fever. No leukocytosis. Pregnancy test is negative. Lipase of 31. She centimeters to inpatient for further evaluation and treatment.  Review of Systems: As presented in the history of presenting illness, rest negative. Where does patient live?  Lives with her sister Can patient participate in ADLs? Yes  Allergy:  Allergies  Allergen Reactions  . Advil [Ibuprofen] Rash and Other (See Comments)    "Irritates my bladder"  . Latex Other (See Comments)    If pt wears latex gloves or any contact with latex causes itching and rash. Some tapes also cause itching, skin irritation - paper tape seems to be okay  . Sulfamethoxazole-Trimethoprim Other (See Comments)    Rash, chills and hives  . Tylenol [Acetaminophen] Other  (See Comments)    Makes me pee more- irritates bladder    Past Medical History  Diagnosis Date  . HIV (human immunodeficiency virus infection)   . Trichomonas infection   . Pyelonephritis   . Recurrent boils   . Urinary frequency     and nocturia  . Hydronephrosis, left   . Cyst of ovary     PT STATES SHE IS TAKING ANTIBIOTICS FOR CYST ON OVARY    Past Surgical History  Procedure Laterality Date  . Cesarean section  2006  . Cystoscopy with retrograde pyelogram, ureteroscopy and stent placement Bilateral 10/28/2013    Procedure: CYSTOSCOPY WITH RETROGRADE PYELOGRAM, CYSTOGRAM;  Surgeon: Valetta Fulleravid S Grapey, MD;  Location: [facility];  Service: Urology;  Laterality: Bilateral;    Social History:  reports that she has been smoking Cigarettes.  She has a 2.1 pack-year smoking history. She has never used smokeless tobacco. She reports that she uses illicit drugs (Marijuana) about 5 times per week. She reports that she does not drink alcohol.  Family History: History reviewed. No pertinent family history.   Prior to Admission medications   Medication Sig Start Date End Date Taking? Authorizing Provider  dolutegravir (TIVICAY) 50 MG tablet Take 50 mg by mouth at bedtime.   Yes Historical Provider, MD  lamiVUDine (EPIVIR) 10 MG/ML solution Take 300 mg by mouth at bedtime.   Yes Historical Provider, MD  rilpivirine (EDURANT) 25 MG TABS tablet Take 25 mg by mouth at bedtime.   Yes Historical Provider, MD  doxycycline (VIBRAMYCIN) 100 MG capsule Take 1 capsule (100 mg total) by mouth 2 (two) times daily. 10/13/13   Aviva SignsMarie L Williams, CNM  metroNIDAZOLE (  FLAGYL) 500 MG tablet Take 500 mg by mouth 2 (two) times daily.    Historical Provider, MD    Physical Exam: Filed Vitals:   11/16/13 2330 11/17/13 0000 11/17/13 0015 11/17/13 0100  BP: 109/71 106/70 103/67 107/78  Pulse:  100 93 96  Temp:      TempSrc:      Resp:      Height:      Weight:      SpO2:  100% 99% 98%   General: Not in acute  distress HEENT:       Eyes: PERRL, EOMI, no scleral icterus       ENT: No discharge from the ears and nose, no pharynx injection, no tonsillar enlargement.        Neck: No JVD, no bruit, no mass felt. Cardiac: S1/S2, RRR, No murmurs, No gallops or rubs Pulm: Good air movement bilaterally. Clear to auscultation bilaterally. No rales, wheezing, rhonchi or rubs. Abd: Soft, nondistended, mildly tender, no rebound pain, no organomegaly, BS present. CVA tenderness bilaterally. Ext: No edema bilaterally. 2+DP/PT pulse bilaterally Musculoskeletal: No joint deformities, erythema, or stiffness, ROM full Skin: No rashes.  Neuro: Alert and oriented X3, cranial nerves II-XII grossly intact, muscle strength 5/5 in all extremeties, sensation to light touch intact.  Psych: Patient is not psychotic, no suicidal or hemocidal ideation.  Labs on Admission:  Basic Metabolic Panel:  Recent Labs Lab 11/16/13 2017  NA 132*  K 4.0  CL 96  CO2 22  GLUCOSE 96  BUN 7  CREATININE 0.57  CALCIUM 9.4   Liver Function Tests:  Recent Labs Lab 11/16/13 2017  AST 29  ALT 18  ALKPHOS 101  BILITOT 0.3  PROT 10.2*  ALBUMIN 2.8*    Recent Labs Lab 11/16/13 2017  LIPASE 31   No results for input(s): AMMONIA in the last 168 hours. CBC:  Recent Labs Lab 11/16/13 2017  WBC 5.0  NEUTROABS 3.3  HGB 11.3*  HCT 34.1*  MCV 91.9  PLT 317   Cardiac Enzymes: No results for input(s): CKTOTAL, CKMB, CKMBINDEX, TROPONINI in the last 168 hours.  BNP (last 3 results) No results for input(s): PROBNP in the last 8760 hours. CBG: No results for input(s): GLUCAP in the last 168 hours.  Radiological Exams on Admission: No results found.  Assessment/Plan Principal Problem:   Pyelonephritis Active Problems:   Depression   Mild cognitive impairment   HIV (human immunodeficiency virus infection)   Vesico-ureteral reflux  Pyelonephritis: patient has recurrent pyelonephritis, most likely secondary to  vesico-ureteral reflex currently patient is hemodynamically stable. Clinically not septic. - Admit to med-surg - IVF: NS @ 13425mL/hr, will bolus if needed - Ceftriaxone IV - Follow up results of urine and blood cx and amend antibiotic regimen if needed per sensitivity results - Dilaudid for pain control - zofran for nausea - May follow up with urology at discharge  HIV: well controlled. CD4 170 and VL>20 on 10/15/13 -continue home medications  Depression: stable. Not on medications at home. No suicidal or homicidal ideations. - Observe closely.  DVT ppx: SQ Heparin     Code Status: Full code Family Communication: None at bed side.    Disposition Plan: Admit to inpatient   Date of Service 11/17/2013    Lorretta HarpIU, Charman Blasco [facility] Pager [phone]  If 7PM-7AM, please contact night-coverage [email or web address] Password [email or web address] 11/17/2013, 1:06 AM

## 2013-11-17 NOTE — Progress Notes (Signed)
Patient wanting IV pain med, but BP88/56.  MD notified.

## 2013-11-18 ENCOUNTER — Inpatient Hospital Stay (HOSPITAL_COMMUNITY): Payer: Medicaid Other

## 2013-11-18 ENCOUNTER — Encounter (HOSPITAL_COMMUNITY): Payer: Self-pay | Admitting: *Deleted

## 2013-11-18 DIAGNOSIS — B951 Streptococcus, group B, as the cause of diseases classified elsewhere: Secondary | ICD-10-CM

## 2013-11-18 DIAGNOSIS — M549 Dorsalgia, unspecified: Secondary | ICD-10-CM | POA: Insufficient documentation

## 2013-11-18 DIAGNOSIS — A491 Streptococcal infection, unspecified site: Secondary | ICD-10-CM | POA: Insufficient documentation

## 2013-11-18 DIAGNOSIS — N399 Disorder of urinary system, unspecified: Secondary | ICD-10-CM

## 2013-11-18 LAB — BASIC METABOLIC PANEL
ANION GAP: 10 (ref 5–15)
BUN: 4 mg/dL — ABNORMAL LOW (ref 6–23)
CALCIUM: 8.6 mg/dL (ref 8.4–10.5)
CO2: 20 mEq/L (ref 19–32)
CREATININE: 0.63 mg/dL (ref 0.50–1.10)
Chloride: 104 mEq/L (ref 96–112)
Glucose, Bld: 84 mg/dL (ref 70–99)
Potassium: 4 mEq/L (ref 3.7–5.3)
SODIUM: 134 meq/L — AB (ref 137–147)

## 2013-11-18 LAB — CBC
HCT: 27.3 % — ABNORMAL LOW (ref 36.0–46.0)
Hemoglobin: 9 g/dL — ABNORMAL LOW (ref 12.0–15.0)
MCH: 30.4 pg (ref 26.0–34.0)
MCHC: 33 g/dL (ref 30.0–36.0)
MCV: 92.2 fL (ref 78.0–100.0)
PLATELETS: 241 10*3/uL (ref 150–400)
RBC: 2.96 MIL/uL — ABNORMAL LOW (ref 3.87–5.11)
RDW: 13.7 % (ref 11.5–15.5)
WBC: 2.6 10*3/uL — ABNORMAL LOW (ref 4.0–10.5)

## 2013-11-18 LAB — URINE CULTURE

## 2013-11-18 MED ORDER — POLYETHYLENE GLYCOL 3350 17 G PO PACK
17.0000 g | PACK | Freq: Every day | ORAL | Status: DC
  Administered 2013-11-18: 17 g via ORAL
  Filled 2013-11-18 (×2): qty 1

## 2013-11-18 MED ORDER — GADOBENATE DIMEGLUMINE 529 MG/ML IV SOLN
15.0000 mL | Freq: Once | INTRAVENOUS | Status: AC | PRN
  Administered 2013-11-18: 15 mL via INTRAVENOUS

## 2013-11-18 MED ORDER — AMOXICILLIN 500 MG PO CAPS
500.0000 mg | ORAL_CAPSULE | Freq: Three times a day (TID) | ORAL | Status: DC
  Administered 2013-11-18 – 2013-11-19 (×3): 500 mg via ORAL
  Filled 2013-11-18 (×3): qty 1

## 2013-11-18 MED ORDER — OXYCODONE HCL 5 MG/5ML PO SOLN
5.0000 mg | ORAL | Status: DC | PRN
  Administered 2013-11-18 – 2013-11-19 (×3): 5 mg via ORAL
  Filled 2013-11-18 (×3): qty 5

## 2013-11-18 NOTE — Consult Note (Addendum)
[facility]  Total days of antibiotics 3        Day 3 ceftriaxone               Reason for Consult: possible uti in hiv infected patient with urologic disorder    Referring Physician: ghimire  Principal Problem:   Pyelonephritis Active Problems:   Depression   Mild cognitive impairment   HIV (human immunodeficiency virus infection)   Vesico-ureteral reflux    HPI: Kara Allen is a 33 y.o. 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 Linus Salmons in [place]. She was recently admitted in early October for fever, flank apin and found to have left hydronephrosis, left hydroureters sans stone obstruction. She was followed up in urology who did cystoscopy, bilateral retrograde pyelography, cystogram.Cystoscopy revealed laterally displaced ureteral orifices that appeared capacious and consistent with findings normally seen with vesicoureteral reflux. Retrograde pyelogram on the right side showed a normal ureter without evidence of obstruction. The calyceal system however did show some dilation and blunting. On the left side there was some mild narrowing near the UPJ. This appeared consistent potentially with some extrinsic compression from a vessel. There was not evidence of a high-grade obstruction. There was mild dilation of the renal pelvis and calyceal system but nothing that appeared to be again significant or high-grade. Contrast cleared well from the collecting system bilaterally. There was some slight hang-up of contrast in the left renal pelvis. Cystogram was performed and clear evidence of bilateral vesicoureteral reflux was noted. No high grade obstruction. No stenting done. She then developed over the next week, having  bilateral low back pain, dysuria, urinary frequency and urgency. She also has mild nausea, but no diarrhea. She was started on ceftriaxone for UA c/w UTi due to TMTC WBC and  urine cx showing group b streptoccus. Blood cx negative. She remains afebrile.   Past Medical History  Diagnosis Date  . HIV (human immunodeficiency virus infection)   . Trichomonas infection   . Pyelonephritis   . Recurrent boils   . Urinary frequency     and nocturia  . Hydronephrosis, left   . Cyst of ovary     PT STATES SHE IS TAKING ANTIBIOTICS FOR CYST ON OVARY    Allergies:  Allergies  Allergen Reactions  . Advil [Ibuprofen] Rash and Other (See Comments)    "Irritates my bladder"  . Latex Other (See Comments)    If pt wears latex gloves or any contact with latex causes itching and rash. Some tapes also cause itching, skin irritation - paper tape seems to be okay  . Sulfamethoxazole-Trimethoprim Other (See Comments)    Rash, chills and hives  . Tylenol [Acetaminophen] Other (See Comments)    Makes me pee more- irritates bladder    MEDICATIONS: . dolutegravir  50 mg Oral QHS  . heparin  5,000 Units Subcutaneous 3 times per day  . lamiVUDine  300 mg Oral QHS  . polyethylene glycol  17 g Oral Daily  . rilpivirine  25 mg Oral QHS    History  Substance Use Topics  . Smoking status: Current Every Day Smoker -- 0.15 packs/day for 14 years    Types: Cigarettes    Last Attempt to Quit: 06/13/2013  . Smokeless tobacco: Never Used     Comment: smoking 1-2 black and milds per day  . Alcohol Use: No     Comment: no longer  smokes cigarettes - does smoke tobacco product called blackmilds    History reviewed. No pertinent family history.   Review of Systems  Constitutional: Negative for fever, chills, diaphoresis, activity change, appetite change, fatigue and unexpected weight change.  HENT: Negative for congestion, sore throat, rhinorrhea, sneezing, trouble swallowing and sinus pressure.  Eyes: Negative for photophobia and visual disturbance.  Respiratory: Negative for cough, chest tightness, shortness of breath, wheezing and stridor.  Cardiovascular: Negative for chest  pain, palpitations and leg swelling.  Gastrointestinal: Negative for nausea, vomiting,diarrhea but has abdominal lower, bilaterally Genitourinary: Negative for dysuria, hematuria, flank pain and difficulty urinating.  Musculoskeletal: Negative for myalgias, back pain, joint swelling, arthralgias and gait problem.  Skin: Negative for color change, pallor, rash and wound.  Neurological: Negative for dizziness, tremors, weakness and light-headedness.  Hematological: Negative for adenopathy. Does not bruise/bleed easily.  Psychiatric/Behavioral: Negative for behavioral problems, confusion, sleep disturbance, dysphoric mood, decreased concentration and agitation.     OBJECTIVE: Temp:  [97.9 F (36.6 C)-98.4 F (36.9 C)] 98.2 F (36.8 C) (11/18 1402) Pulse Rate:  [69-87] 69 (11/18 1402) Resp:  [15-18] 15 (11/18 1402) BP: (92-107)/(50-73) 107/67 mmHg (11/18 1402) SpO2:  [100 %] 100 % (11/18 1402) Physical Exam  Constitutional:  oriented to person, place, and time. appears well-developed and well-nourished. No distress.  HENT:  Mouth/Throat: Oropharynx is clear and moist. No oropharyngeal exudate.  Cardiovascular: Normal rate, regular rhythm and normal heart sounds. Exam reveals no gallop and no friction rub.  No murmur heard.  Pulmonary/Chest: Effort normal and breath sounds normal. No respiratory distress.  has no wheezes.  Abdominal: Soft. Bowel sounds are normal.  exhibits no distension. Mild bilateral lower quadrant and flank tenderness Back : back pain Lymphadenopathy: no cervical adenopathy.  Neurological: alert and oriented to person, place, and time.  Skin: Skin is warm and dry. No rash noted. No erythema.  Psychiatric: a normal mood and affect. His behavior is normal.   LABS: Results for orders placed or performed during the hospital encounter of 11/16/13 (from the past 48 hour(s))  Comprehensive metabolic panel     Status: Abnormal   Collection Time: 11/16/13  8:17 PM  Result  Value Ref Range   Sodium 132 (L) 137 - 147 mEq/L   Potassium 4.0 3.7 - 5.3 mEq/L   Chloride 96 96 - 112 mEq/L   CO2 22 19 - 32 mEq/L   Glucose, Bld 96 70 - 99 mg/dL   BUN 7 6 - 23 mg/dL   Creatinine, Ser 0.57 0.50 - 1.10 mg/dL   Calcium 9.4 8.4 - 10.5 mg/dL   Total Protein 10.2 (H) 6.0 - 8.3 g/dL   Albumin 2.8 (L) 3.5 - 5.2 g/dL   AST 29 0 - 37 U/L   ALT 18 0 - 35 U/L   Alkaline Phosphatase 101 39 - 117 U/L   Total Bilirubin 0.3 0.3 - 1.2 mg/dL   GFR calc non Af Amer >90 >90 mL/min   GFR calc Af Amer >90 >90 mL/min    Comment: (NOTE) The eGFR has been calculated using the CKD EPI equation. This calculation has not been validated in all clinical situations. eGFR's persistently <90 mL/min signify possible Chronic Kidney Disease.    Anion gap 14 5 - 15  CBC with Differential     Status: Abnormal   Collection Time: 11/16/13  8:17 PM  Result Value Ref Range   WBC 5.0 4.0 - 10.5 K/uL   RBC 3.71 (L) 3.87 - 5.11 MIL/uL  Hemoglobin 11.3 (L) 12.0 - 15.0 g/dL   HCT 34.1 (L) 36.0 - 46.0 %   MCV 91.9 78.0 - 100.0 fL   MCH 30.5 26.0 - 34.0 pg   MCHC 33.1 30.0 - 36.0 g/dL   RDW 13.7 11.5 - 15.5 %   Platelets 317 150 - 400 K/uL   Neutrophils Relative % 66 43 - 77 %   Neutro Abs 3.3 1.7 - 7.7 K/uL   Lymphocytes Relative 23 12 - 46 %   Lymphs Abs 1.2 0.7 - 4.0 K/uL   Monocytes Relative 10 3 - 12 %   Monocytes Absolute 0.5 0.1 - 1.0 K/uL   Eosinophils Relative 1 0 - 5 %   Eosinophils Absolute 0.0 0.0 - 0.7 K/uL   Basophils Relative 0 0 - 1 %   Basophils Absolute 0.0 0.0 - 0.1 K/uL  Lipase, blood     Status: None   Collection Time: 11/16/13  8:17 PM  Result Value Ref Range   Lipase 31 11 - 59 U/L  Pregnancy, urine     Status: None   Collection Time: 11/16/13  8:24 PM  Result Value Ref Range   Preg Test, Ur NEGATIVE NEGATIVE    Comment:        THE SENSITIVITY OF THIS METHODOLOGY IS >20 mIU/mL.   Urinalysis, Routine w reflex microscopic     Status: Abnormal   Collection Time:  11/16/13  8:24 PM  Result Value Ref Range   Color, Urine YELLOW YELLOW   APPearance TURBID (A) CLEAR   Specific Gravity, Urine 1.015 1.005 - 1.030   pH 6.0 5.0 - 8.0   Glucose, UA NEGATIVE NEGATIVE mg/dL   Hgb urine dipstick LARGE (A) NEGATIVE   Bilirubin Urine NEGATIVE NEGATIVE   Ketones, ur NEGATIVE NEGATIVE mg/dL   Protein, ur 100 (A) NEGATIVE mg/dL   Urobilinogen, UA 0.2 0.0 - 1.0 mg/dL   Nitrite POSITIVE (A) NEGATIVE   Leukocytes, UA LARGE (A) NEGATIVE  Urine microscopic-add on     Status: Abnormal   Collection Time: 11/16/13  8:24 PM  Result Value Ref Range   Squamous Epithelial / LPF RARE RARE   WBC, UA TOO NUMEROUS TO COUNT <3 WBC/hpf   RBC / HPF 7-10 <3 RBC/hpf   Bacteria, UA FEW (A) RARE  Urine culture     Status: None   Collection Time: 11/16/13  8:24 PM  Result Value Ref Range   Specimen Description URINE, RANDOM    Special Requests NONE    Culture  Setup Time      11/17/2013 13:08 Performed at [place]      >=100,000 COLONIES/ML Performed at [place] B STREP(S.AGALACTIAE)ISOLATED Note: TESTING AGAINST S. AGALACTIAE NOT ROUTINELY PERFORMED DUE TO PREDICTABILITY OF AMP/PEN/VAN SUSCEPTIBILITY. Performed at [facility]    Report Status 11/18/2013 FINAL   Blood culture (routine x 2)     Status: None (Preliminary result)   Collection Time: 11/17/13  1:09 AM  Result Value Ref Range   Specimen Description BLOOD RIGHT HAND    Special Requests BOTTLES DRAWN AEROBIC AND ANAEROBIC 5CC    Culture  Setup Time      11/17/2013 13:07 Performed at [place] NO GROWTH TO DATE CULTURE WILL BE HELD FOR 5 DAYS BEFORE ISSUING A  FINAL NEGATIVE REPORT Performed at [facility]    Report Status PENDING   Blood culture (routine x 2)     Status: None (Preliminary result)   Collection Time: 11/17/13  1:17 AM  Result Value Ref Range   Specimen  Description BLOOD LEFT HAND    Special Requests BOTTLES DRAWN AEROBIC AND ANAEROBIC 5CC    Culture  Setup Time      11/17/2013 13:06 Performed at [place] NO GROWTH TO DATE CULTURE WILL BE HELD FOR 5 DAYS BEFORE ISSUING A FINAL NEGATIVE REPORT Performed at [facility]    Report Status PENDING   Basic metabolic panel     Status: Abnormal   Collection Time: 11/17/13  2:49 AM  Result Value Ref Range   Sodium 133 (L) 137 - 147 mEq/L   Potassium 3.4 (L) 3.7 - 5.3 mEq/L   Chloride 99 96 - 112 mEq/L   CO2 21 19 - 32 mEq/L   Glucose, Bld 135 (H) 70 - 99 mg/dL   BUN 6 6 - 23 mg/dL   Creatinine, Ser 0.52 0.50 - 1.10 mg/dL   Calcium 8.7 8.4 - 10.5 mg/dL   GFR calc non Af Amer >90 >90 mL/min   GFR calc Af Amer >90 >90 mL/min    Comment: (NOTE) The eGFR has been calculated using the CKD EPI equation. This calculation has not been validated in all clinical situations. eGFR's persistently <90 mL/min signify possible Chronic Kidney Disease.    Anion gap 13 5 - 15  CBC     Status: Abnormal   Collection Time: 11/17/13  2:49 AM  Result Value Ref Range   WBC 4.8 4.0 - 10.5 K/uL   RBC 3.21 (L) 3.87 - 5.11 MIL/uL   Hemoglobin 9.4 (L) 12.0 - 15.0 g/dL    Comment: REPEATED TO VERIFY DELTA CHECK NOTED    HCT 28.8 (L) 36.0 - 46.0 %   MCV 89.7 78.0 - 100.0 fL   MCH 29.3 26.0 - 34.0 pg   MCHC 32.6 30.0 - 36.0 g/dL   RDW 13.7 11.5 - 15.5 %   Platelets 264 150 - 400 K/uL  CBC     Status: Abnormal   Collection Time: 11/18/13  7:46 AM  Result Value Ref Range   WBC 2.6 (L) 4.0 - 10.5 K/uL   RBC 2.96 (L) 3.87 - 5.11 MIL/uL   Hemoglobin 9.0 (L) 12.0 - 15.0 g/dL   HCT 27.3 (L) 36.0 - 46.0 %   MCV 92.2 78.0 - 100.0 fL   MCH 30.4 26.0 - 34.0 pg   MCHC 33.0 30.0 - 36.0 g/dL   RDW 13.7 11.5 - 15.5 %   Platelets 241 150 - 400 K/uL  Basic metabolic panel     Status: Abnormal   Collection Time: 11/18/13  7:46 AM  Result Value Ref  Range   Sodium 134 (L) 137 - 147 mEq/L   Potassium 4.0 3.7 - 5.3 mEq/L   Chloride 104 96 - 112 mEq/L   CO2 20 19 - 32 mEq/L   Glucose, Bld 84 70 - 99 mg/dL   BUN 4 (L) 6 - 23 mg/dL   Creatinine, Ser 0.63 0.50 - 1.10 mg/dL   Calcium 8.6 8.4 - 10.5 mg/dL   GFR calc non Af Amer >90 >90 mL/min   GFR calc Af Amer >90 >90 mL/min  Comment: (NOTE) The eGFR has been calculated using the CKD EPI equation. This calculation has not been validated in all clinical situations. eGFR's persistently <90 mL/min signify possible Chronic Kidney Disease.    Anion gap 10 5 - 15    MICRO:  IMAGING: Dg Chest 2 View  11/18/2013   CLINICAL DATA:  Back pain, kidney and bladder pain  EXAM: CHEST  2 VIEW  COMPARISON:  None.  FINDINGS: Cardiomediastinal silhouette is unremarkable. No acute infiltrate or pleural effusion. No pulmonary edema. Bony thorax is unremarkable.  IMPRESSION: No active cardiopulmonary disease.   Electronically Signed   By: Lahoma Crocker M.D.   On: 11/18/2013 15:09   US Renal  11/17/2013   CLINICAL DATA:  Pyelonephritis, history HIV  EXAM: RENAL/URINARY TRACT ULTRASOUND COMPLETE  COMPARISON:  Renal ultrasound 10/01/2013 ; correlation CT abdomen and pelvis 09/30/2013  FINDINGS: Right Kidney:  Length: 13.1 cm. Increased cortical thickness. Slightly increased cortical echogenicity. No mass, hydronephrosis, or shadowing calcification.  Left Kidney:  Length: 13.6 cm. Slight thickening renal cortex. Increased cortical echogenicity. Significant dilatation of the upper pole LEFT renal collecting system with decompressed renal pelvis and lower pole collecting system, distribution similar to that seen on prior CT, raising question of infundibular stenosis. No definite mass or shadowing calcification.  Bladder:  Normal appearance.  Ureteral jets were not visualized.  IMPRESSION: Increased renal cortical thickness and echogenicity bilaterally suggesting medical renal disease.  Dilatation of the collecting  system at the upper pole of the LEFT kidney without renal pelvic or lower pole collecting system dilatation, suggesting infundibular stenosis.  This can be seen with renal infections particularly tuberculosis, with tumor, and due to a congenital anomaly secondary to a crossing vessel.  In a patient who is HIV positive, TB should be excluded.   Electronically Signed   By: Lavonia Dana M.D.   On: 11/17/2013 21:29    Assessment/Plan: 33yo F with longstanding hiv disease, well controlled, who has had repeat recurrent uti since the age of 33 re admitted for possible pyelonephritis with flank/back pain, urinary frequency. Ur cx growing group B streptococcus. Ultrasound showling infundibular stenosis, and part of differential includes renal tb.  Group b streptococcus urinary tract infection/pyelonephritis = can switch to orals and treat with amoxicillin 569m tid x 8 addn days  Renal abnormality = unclear if it strictly due to anatomical abnormality vs. Infectious cause. Can send of quantiferon (looking for exposure to TB), afb of urine cx,  Back pain = recommend mri of spine, possibly has radiculopathy that might explain her flank pain.  hiv = continue on current regimen of tivicay, lamivudine, rilpivirine.  CElzie RingsS[place]for Infectious Diseases [phone]

## 2013-11-18 NOTE — Progress Notes (Signed)
Patient returned to floor from ultrasound at 2000.

## 2013-11-18 NOTE — Progress Notes (Deleted)
Patient returned to 4N09 from ultrasound at 2000.

## 2013-11-18 NOTE — Progress Notes (Signed)
PATIENT DETAILS Name: Kara Allen Age: 33 y.o. Sex: female Date of Birth: 18-Mar-1980 Admit Date: 11/16/2013 Admitting Physician Lorretta Harp, MD PCP:No PCP Per Patient  Subjective: Still with some back pain-but overall better.  Assessment/Plan: Principal Problem: Worsening Back Pain:althoughurinalysis consistent with UTI, no other symptoms. Very similar to her most recent admission.Recentoutpatient cystoscopy with retrograde pyelogram showed possibility of ureterovesical reflux, calyceal system on the left did show some mild dilatation without any evidence of high-grade obstruction Given HIV status, negative recent urine culture,? Inflammation in her left kidney-? renal tuberculosis. Will stop Rocephin, obtain 2 view chest x-ray, check Gold quantity for on and do a urine AFB smear and culture.I will check a MRI of the lumbar spine with contrast as well. Spoke with infectious disease-Dr. Ilsa Iha who suggested as above as well.  Active Problems:   GNF:AOZH CD4 count of 170 on 10/15. Continue antiretrovirals.  Disposition: Remain inpatient till work up complete  Antibiotics:  Rocephin 11/17 >>11/18   Anti-infectives    Start     Dose/Rate Route Frequency Ordered Stop   11/17/13 2200  cefTRIAXone (ROCEPHIN) 1 g in dextrose 5 % 50 mL IVPB - Premix     1 g100 mL/hr over 30 Minutes Intravenous Every 24 hours 11/17/13 0132     11/17/13 0145  dolutegravir (TIVICAY) tablet 50 mg     50 mg Oral Daily at bedtime 11/17/13 0132     11/17/13 0145  lamiVUDine (EPIVIR) 10 MG/ML solution 300 mg     300 mg Oral Daily at bedtime 11/17/13 0132     11/17/13 0145  rilpivirine (EDURANT) tablet 25 mg     25 mg Oral Daily at bedtime 11/17/13 0132     11/16/13 2330  cefTRIAXone (ROCEPHIN) 1 g in dextrose 5 % 50 mL IVPB     1 g100 mL/hr over 30 Minutes Intravenous  Once 11/16/13 2329 11/17/13 0041      DVT Prophylaxis: Prophylactic Heparin   Code Status: Full code   Family  Communication None at bedside  Procedures:  None  CONSULTS:  None  Time spent 40 minutes-which includes 50% of the time with face-to-face with patient/ family and coordinating care related to the above assessment and plan.  MEDICATIONS: Scheduled Meds: . cefTRIAXone (ROCEPHIN)  IV  1 g Intravenous Q24H  . dolutegravir  50 mg Oral QHS  . heparin  5,000 Units Subcutaneous 3 times per day  . lamiVUDine  300 mg Oral QHS  . rilpivirine  25 mg Oral QHS   Continuous Infusions: . sodium chloride 125 mL/hr at 11/18/13 0633   PRN Meds:.morphine injection, ondansetron **OR** ondansetron (ZOFRAN) IV    PHYSICAL EXAM: Vital signs in last 24 hours: Filed Vitals:   11/17/13 2150 11/18/13 0038 11/18/13 0450 11/18/13 0933  BP: 99/50 104/62 92/52 105/72  Pulse: 76 87 82 85  Temp: 97.9 F (36.6 C) 98.4 F (36.9 C) 98.2 F (36.8 C) 98.2 F (36.8 C)  TempSrc: Oral Oral Oral Oral  Resp: 18 16 16 15   Height:      Weight:      SpO2: 100% 100% 100% 100%    Weight change:  Filed Weights   11/16/13 2004 11/17/13 0100  Weight: 63.504 kg (140 lb) 63.504 kg (140 lb)   Body mass index is 26.47 kg/(m^2).   Gen Exam: Awake and alert with clear speech.   Neck: Supple, No JVD.   Chest: B/L Clear.   CVS: S1  S2 Regular, no murmurs.  Abdomen: soft, BS +, non tender, non distended.  Extremities: no edema, lower extremities warm to touch. Neurologic: Non Focal.   Skin: No Rash.   Wounds: N/A.  Intake/Output from previous day:  Intake/Output Summary (Last 24 hours) at 11/18/13 1319 Last data filed at 11/17/13 1737  Gross per 24 hour  Intake    240 ml  Output      0 ml  Net    240 ml     LAB RESULTS: CBC  Recent Labs Lab 11/16/13 2017 11/17/13 0249 11/18/13 0746  WBC 5.0 4.8 2.6*  HGB 11.3* 9.4* 9.0*  HCT 34.1* 28.8* 27.3*  PLT 317 264 241  MCV 91.9 89.7 92.2  MCH 30.5 29.3 30.4  MCHC 33.1 32.6 33.0  RDW 13.7 13.7 13.7  LYMPHSABS 1.2  --   --   MONOABS 0.5  --   --    EOSABS 0.0  --   --   BASOSABS 0.0  --   --     Chemistries   Recent Labs Lab 11/16/13 2017 11/17/13 0249 11/18/13 0746  NA 132* 133* 134*  K 4.0 3.4* 4.0  CL 96 99 104  CO2 22 21 20   GLUCOSE 96 135* 84  BUN 7 6 4*  CREATININE 0.57 0.52 0.63  CALCIUM 9.4 8.7 8.6    CBG: No results for input(s): GLUCAP in the last 168 hours.  GFR Estimated Creatinine Clearance: 87 mL/min (by C-G formula based on Cr of 0.63).  Coagulation profile No results for input(s): INR, PROTIME in the last 168 hours.  Cardiac Enzymes No results for input(s): CKMB, TROPONINI, MYOGLOBIN in the last 168 hours.  Invalid input(s): CK  Invalid input(s): POCBNP No results for input(s): DDIMER in the last 72 hours. No results for input(s): HGBA1C in the last 72 hours. No results for input(s): CHOL, HDL, LDLCALC, TRIG, CHOLHDL, LDLDIRECT in the last 72 hours. No results for input(s): TSH, T4TOTAL, T3FREE, THYROIDAB in the last 72 hours.  Invalid input(s): FREET3 No results for input(s): VITAMINB12, FOLATE, FERRITIN, TIBC, IRON, RETICCTPCT in the last 72 hours.  Recent Labs  11/16/13 2017  LIPASE 31    Urine Studies No results for input(s): UHGB, CRYS in the last 72 hours.  Invalid input(s): UACOL, UAPR, USPG, UPH, UTP, UGL, UKET, UBIL, UNIT, UROB, ULEU, UEPI, UWBC, URBC, UBAC, CAST, UCOM, BILUA  MICROBIOLOGY: Recent Results (from the past 240 hour(s))  Blood culture (routine x 2)     Status: None (Preliminary result)   Collection Time: 11/17/13  1:09 AM  Result Value Ref Range Status   Specimen Description BLOOD RIGHT HAND  Final   Special Requests BOTTLES DRAWN AEROBIC AND ANAEROBIC 5CC  Final   Culture  Setup Time   Final    11/17/2013 13:07 [facility]    Culture   Final           BLOOD CULTURE RECEIVED NO GROWTH TO DATE CULTURE WILL BE HELD FOR 5 DAYS BEFORE ISSUING A FINAL NEGATIVE REPORT [facility]formed at [facility]    Report Status PENDING   Incomplete  Blood culture (routine x 2)     Status: None (Preliminary result)   Collection Time: 11/17/13  1:17 AM  Result Value Ref Range Status   Specimen Description BLOOD LEFT HAND  Final   Special Requests BOTTLES DRAWN AEROBIC AND ANAEROBIC 5CC  Final   Culture  Setup Time   Final    11/17/2013  13:06 [facility]    Culture   Final           BLOOD CULTURE RECEIVED NO GROWTH TO DATE CULTURE WILL BE HELD FOR 5 DAYS BEFORE ISSUING A FINAL NEGATIVE REPORT Performed at [facility]    Report Status PENDING  Incomplete    RADIOLOGY STUDIES/RESULTS: [place] [facility]al  11/17/2013   CLINICAL DATA:  Pyelonephritis, history HIV  EXAM: RENAL/URINARY TRACT ULTRASOUND COMPLETE  COMPARISON:  Renal ultrasound 10/01/2013 ; correlation CT abdomen and pelvis 09/30/2013  FINDINGS: Right Kidney:  Length: 13.1 cm. Increased cortical thickness. Slightly increased cortical echogenicity. No mass, hydronephrosis, or shadowing calcification.  Left Kidney:  Length: 13.6 cm. Slight thickening renal cortex. Increased cortical echogenicity. Significant dilatation of the upper pole LEFT renal collecting system with decompressed renal pelvis and lower pole collecting system, distribution similar to that seen on prior CT, raising question of infundibular stenosis. No definite mass or shadowing calcification.  Bladder:  Normal appearance.  Ureteral jets were not visualized.  IMPRESSION: Increased renal cortical thickness and echogenicity bilaterally suggesting medical renal disease.  Dilatation of the collecting system at the upper pole of the LEFT kidney without renal pelvic or lower pole collecting system dilatation, suggesting infundibular stenosis.  This can be seen with renal infections particularly tuberculosis, with tumor, and due to a congenital anomaly secondary to a crossing vessel.  In a patient who is HIV positive, TB should be excluded.   Electronically Signed   By: Ulyses SouthwardMark  Boles M.D.   On:  11/17/2013 21:29    Jeoffrey MassedGHIMIRE,Valeska Haislip, MD  [facility] Pager:[phone]  If 7PM-7AM, please contact night-coverage [email or web address] Password [email or web address] 11/18/2013, 1:19 PM   LOS: 2 days

## 2013-11-18 NOTE — Progress Notes (Signed)
Patient returned from Radiology to 4N09 at 1927.

## 2013-11-19 DIAGNOSIS — M545 Low back pain: Secondary | ICD-10-CM

## 2013-11-19 LAB — BASIC METABOLIC PANEL
Anion gap: 10 (ref 5–15)
BUN: 5 mg/dL — ABNORMAL LOW (ref 6–23)
CHLORIDE: 105 meq/L (ref 96–112)
CO2: 22 mEq/L (ref 19–32)
Calcium: 8.7 mg/dL (ref 8.4–10.5)
Creatinine, Ser: 0.64 mg/dL (ref 0.50–1.10)
GFR calc non Af Amer: >90 mL/min (ref >90)
Glucose, Bld: 89 mg/dL (ref 70–99)
POTASSIUM: 4 meq/L (ref 3.7–5.3)
Sodium: 137 mEq/L (ref 137–147)

## 2013-11-19 MED ORDER — OXYCODONE HCL 5 MG/5ML PO SOLN: 5.0000 mg | Freq: Four times a day (QID) | ORAL | Status: DC | PRN

## 2013-11-19 MED ORDER — AMOXICILLIN 500 MG PO CAPS: 500.0000 mg | ORAL_CAPSULE | Freq: Three times a day (TID) | ORAL | Status: DC

## 2013-11-19 NOTE — Discharge Summary (Signed)
PATIENT DETAILS Name: Kara Allen Age: 33 y.o. Sex: female Date of Birth: 06/24/1980 MRN: [id number]. Admitting Physician: Lorretta HarpXilin Niu, MD PCP:No PCP Per Patient  Admit Date: 11/16/2013 Discharge date: 11/19/2013  Recommendations for Outpatient Follow-up:  1. Gold quantiferon, and Urine AFB culture pending-please follow 2. Patient needs GYN follow up for complex Ovarian Cyst (Pelvic Ultrasound 10/12) 3. Will need follow up with Urology -Dr Isabel CapriceGrapey  PRIMARY DISCHARGE DIAGNOSIS:  Principal Problem:   Pyelonephritis Active Problems:   Depression   Mild cognitive impairment   HIV (human immunodeficiency virus infection)   Vesico-ureteral reflux   Back pain   Group B streptococcal infection      PAST MEDICAL HISTORY: Past Medical History  Diagnosis Date  . HIV (human immunodeficiency virus infection)   . Trichomonas infection   . Pyelonephritis   . Recurrent boils   . Urinary frequency     and nocturia  . Hydronephrosis, left   . Cyst of ovary     PT STATES SHE IS TAKING ANTIBIOTICS FOR CYST ON OVARY    DISCHARGE MEDICATIONS: Current Discharge Medication List    START taking these medications   Details  amoxicillin (AMOXIL) 500 MG capsule Take 1 capsule (500 mg total) by mouth every 8 (eight) hours. Qty: 24 capsule, Refills: 0    oxyCODONE (ROXICODONE) 5 MG/5ML solution Take 5 mLs (5 mg total) by mouth every 6 (six) hours as needed for moderate pain. Qty: 60 mL, Refills: 0      CONTINUE these medications which have NOT CHANGED   Details  dolutegravir (TIVICAY) 50 MG tablet Take 50 mg by mouth at bedtime.    lamiVUDine (EPIVIR) 10 MG/ML solution Take 300 mg by mouth at bedtime.    rilpivirine (EDURANT) 25 MG TABS tablet Take 25 mg by mouth at bedtime.    doxycycline (VIBRAMYCIN) 100 MG capsule Take 1 capsule (100 mg total) by mouth 2 (two) times daily. Qty: 28 capsule, Refills: 0      STOP taking these medications     metroNIDAZOLE (FLAGYL) 500 MG  tablet         ALLERGIES:   Allergies  Allergen Reactions  . Advil [Ibuprofen] Rash and Other (See Comments)    "Irritates my bladder"  . Latex Other (See Comments)    If pt wears latex gloves or any contact with latex causes itching and rash. Some tapes also cause itching, skin irritation - paper tape seems to be okay  . Sulfamethoxazole-Trimethoprim Other (See Comments)    Rash, chills and hives  . Tylenol [Acetaminophen] Other (See Comments)    Makes me pee more- irritates bladder    BRIEF HPI:  See H&P, Labs, Consult and Test reports for all details in brief, patient is a 33 y.o. female with past medical history of HIV, depression, mild mental retardation, vesico-ureteral reflex, who presents with abdominal pain, dysuria, urinary frequency and urgency.   CONSULTATIONS:   ID  PERTINENT RADIOLOGIC STUDIES: Dg Chest 2 View  11/18/2013   CLINICAL DATA:  Back pain, kidney and bladder pain  EXAM: CHEST  2 VIEW  COMPARISON:  None.  FINDINGS: Cardiomediastinal silhouette is unremarkable. No acute infiltrate or pleural effusion. No pulmonary edema. Bony thorax is unremarkable.  IMPRESSION: No active cardiopulmonary disease.   Electronically Signed   By: Natasha MeadLiviu  Pop M.D.   On: 11/18/2013 15:09   Mr Lumbar Spine W Wo Contrast  11/18/2013   CLINICAL DATA:  Longstanding HIV infection. Bilateral back pain and flank  pain.  EXAM: MRI LUMBAR SPINE WITHOUT AND WITH CONTRAST  TECHNIQUE: Multiplanar and multiecho pulse sequences of the lumbar spine were obtained without and with intravenous contrast.  CONTRAST:  15mL MULTIHANCE GADOBENATE DIMEGLUMINE 529 MG/ML IV SOLN  COMPARISON:  None.  FINDINGS: Alignment of the spine is normal. There is no significant disc pathology. There is mild bulging of the L5-S1 disc without neural compression. There is minimal L4-5 and L5-S1 facet degeneration. There is no stenosis or neural compression. There is no evidence of spinal infection.  Incidental note is made of  dilated renal collecting system in the upper pole of left kidney.  IMPRESSION: No evidence of spinal pathology.  Dilated renal collecting system in the upper pole of left kidney .   Electronically Signed   By: Paulina Fusi M.D.   On: 11/18/2013 19:54   US Renal  11/17/2013   CLINICAL DATA:  Pyelonephritis, history HIV  EXAM: RENAL/URINARY TRACT ULTRASOUND COMPLETE  COMPARISON:  Renal ultrasound 10/01/2013 ; correlation CT abdomen and pelvis 09/30/2013  FINDINGS: Right Kidney:  Length: 13.1 cm. Increased cortical thickness. Slightly increased cortical echogenicity. No mass, hydronephrosis, or shadowing calcification.  Left Kidney:  Length: 13.6 cm. Slight thickening renal cortex. Increased cortical echogenicity. Significant dilatation of the upper pole LEFT renal collecting system with decompressed renal pelvis and lower pole collecting system, distribution similar to that seen on prior CT, raising question of infundibular stenosis. No definite mass or shadowing calcification.  Bladder:  Normal appearance.  Ureteral jets were not visualized.  IMPRESSION: Increased renal cortical thickness and echogenicity bilaterally suggesting medical renal disease.  Dilatation of the collecting system at the upper pole of the LEFT kidney without renal pelvic or lower pole collecting system dilatation, suggesting infundibular stenosis.  This can be seen with renal infections particularly tuberculosis, with tumor, and due to a congenital anomaly secondary to a crossing vessel.  In a patient who is HIV positive, TB should be excluded.   Electronically Signed   By: Ulyses Southward M.D.   On: 11/17/2013 21:29     PERTINENT LAB RESULTS: CBC:  Recent Labs  11/17/13 0249 11/18/13 0746  WBC 4.8 2.6*  HGB 9.4* 9.0*  HCT 28.8* 27.3*  PLT 264 241   CMET CMP     Component Value Date/Time   NA 137 11/19/2013 0556   K 4.0 11/19/2013 0556   CL 105 11/19/2013 0556   CO2 22 11/19/2013 0556   GLUCOSE 89 11/19/2013 0556   BUN  5* 11/19/2013 0556   CREATININE 0.64 11/19/2013 0556   CREATININE 0.61 10/15/2013 1054   CALCIUM 8.7 11/19/2013 0556   PROT 10.2* 11/16/2013 2017   ALBUMIN 2.8* 11/16/2013 2017   AST 29 11/16/2013 2017   ALT 18 11/16/2013 2017   ALKPHOS 101 11/16/2013 2017   BILITOT 0.3 11/16/2013 2017   GFRNONAA >90 11/19/2013 0556   GFRNONAA >89 10/15/2013 1054   GFRAA >90 11/19/2013 0556   GFRAA >89 10/15/2013 1054    GFR Estimated Creatinine Clearance: 87 mL/min (by C-G formula based on Cr of 0.64).  Recent Labs  11/16/13 2017  LIPASE 31   No results for input(s): CKTOTAL, CKMB, CKMBINDEX, TROPONINI in the last 72 hours. Invalid input(s): POCBNP No results for input(s): DDIMER in the last 72 hours. No results for input(s): HGBA1C in the last 72 hours. No results for input(s): CHOL, HDL, LDLCALC, TRIG, CHOLHDL, LDLDIRECT in the last 72 hours. No results for input(s): TSH, T4TOTAL, T3FREE, THYROIDAB in the last 72  hours.  Invalid input(s): FREET3 No results for input(s): VITAMINB12, FOLATE, FERRITIN, TIBC, IRON, RETICCTPCT in the last 72 hours. Coags: No results for input(s): INR in the last 72 hours.  Invalid input(s): PT Microbiology: Recent Results (from the past 240 hour(s))  Urine culture     Status: None   Collection Time: 11/16/13  8:24 PM  Result Value Ref Range Status   Specimen Description URINE, RANDOM  Final   Special Requests NONE  Final   Culture  Setup Time   Final    11/17/2013 13:08 Performed at [facility] Count   Final    >=100,000 COLONIES/ML Performed at [facility]    Culture   Final    GROUP B STREP(S.AGALACTIAE)ISOLATED Note: TESTING AGAINST S. AGALACTIAE NOT ROUTINELY PERFORMED DUE TO PREDICTABILITY OF AMP/PEN/VAN SUSCEPTIBILITY. Performed at [facility]    Report Status 11/18/2013 FINAL  Final  Blood culture (routine x 2)     Status: None (Preliminary result)   Collection Time: 11/17/13  1:09 AM  Result Value  Ref Range Status   Specimen Description BLOOD RIGHT HAND  Final   Special Requests BOTTLES DRAWN AEROBIC AND ANAEROBIC 5CC  Final   Culture  Setup Time   Final    11/17/2013 13:07 Performed at [facility]    Culture   Final           BLOOD CULTURE RECEIVED NO GROWTH TO DATE CULTURE WILL BE HELD FOR 5 DAYS BEFORE ISSUING A FINAL NEGATIVE REPORT Performed at [facility]    Report Status PENDING  Incomplete  Blood culture (routine x 2)     Status: None (Preliminary result)   Collection Time: 11/17/13  1:17 AM  Result Value Ref Range Status   Specimen Description BLOOD LEFT HAND  Final   Special Requests BOTTLES DRAWN AEROBIC AND ANAEROBIC 5CC  Final   Culture  Setup Time   Final    11/17/2013 13:06 Performed at [facility]    Culture   Final           BLOOD CULTURE RECEIVED NO GROWTH TO DATE CULTURE WILL BE HELD FOR 5 DAYS BEFORE ISSUING A FINAL NEGATIVE REPORT Performed at [facility]    Report Status PENDING  Incomplete     BRIEF HOSPITAL COURSE:   Principal Problem:   Pyelonephritis: 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 Urinalysis was consistent with UTI, patient was felt to have pyelonephritis, started on IV Rocephin. Urine cultures were sent. Urine cultures subsequently were positive for group B streptococcus, patient was then transitioned to oral amoxicillin. She is afebrile, with no leukocytosis. Continues to have intermittent back pain but much better. Since clinically improved, she will be discharged on oral amoxicillin. Not sure if her back pain is actually related to pyelonephritis. For further details please see below.  Active Problems:   Left renal abnormality: Patient had a very similar admission in October, where she was found to have dilation of her left calyceal system. She was seen by urology  as an outpatient, and a recent cystoscopy with retrograde pyelogram showed possibility of ureterovesical reflux, and dilatation of the calyceal system on the left kidney without any evidence of high-grade obstruction. Repeat renal ultrasound this admission showed persistent dilatation of the calyceal system in the left kidney area, given her  HIV status some question of whether this represents renal tuberculosis remains. A chest x-ray was done, and was negative for any significant abnormalities. After discussion with infectious disease, a gold quantiferon and urine AFB smear/cultures were sent and is currently pending. A follow-up appointment with infectious disease has been arranged for December 8.  Back pain: Probably secondary to above. MRI of the lumbar spine was negative for any significant abnormality. She will be discharged on limited supply of narcotics    Left ovarian complex cystic lesion:Pelvic ultrasound done on 10/12 showed a 2. 9 cm complex partially cystic lesions, patient will need a outpatient follow-up with [facility]. Defer to PCP/HIV clinic.  ZOX:WRUEHIV:last CD4 count of 170 on 10/15. Continue antiretrovirals.  TODAY-DAY OF DISCHARGE:  Subjective:   Pleas KochSharon Allen today has no headache,no chest abdominal pain,no new weakness tingling or numbness, feels much better wants to go home today.   Objective:   Blood pressure 99/65, pulse 65, temperature 98.4 F (36.9 C), temperature source Oral, resp. rate 20, height 5\' 1"  (1.549 m), weight 63.504 kg (140 lb), last menstrual period 11/09/2013, SpO2 100 %.  Intake/Output Summary (Last 24 hours) at 11/19/13 1053 Last data filed at 11/19/13 0445  Gross per 24 hour  Intake    120 ml  Output      0 ml  Net    120 ml   Filed Weights   11/16/13 2004 11/17/13 0100  Weight: 63.504 kg (140 lb) 63.504 kg (140 lb)    Exam Awake Alert, Oriented *3, No new F.N deficits, Normal affect [place].AT,PERRAL Supple Neck,No JVD, No cervical lymphadenopathy  appriciated.  Symmetrical Chest wall movement, Good air movement bilaterally, CTAB RRR,No Gallops,Rubs or new Murmurs, No Parasternal Heave +ve B.Sounds, Abd Soft, Non tender, No organomegaly appriciated, No rebound -guarding or rigidity. No Cyanosis, Clubbing or edema, No new Rash or bruise  DISCHARGE CONDITION: Stable  DISPOSITION: Home  DISCHARGE INSTRUCTIONS:    Activity:  As tolerated  Diet recommendation: Regular Diet  Discharge Instructions    Call MD for:  severe uncontrolled pain    Complete by:  As directed      Call MD for:  temperature >100.4    Complete by:  As directed      Diet general    Complete by:  As directed      Increase activity slowly    Complete by:  As directed            Follow-up Information    Follow up with Staci RighterOMER, ROBERT, MD On 12/08/2013.   Specialty:  Infectious Diseases   Why:  appt at 10:15 am   Contact information:   [address] [place] [address] [phone]       Follow up with Reva BoresPRATT,TANYA S, MD. Schedule an appointment as soon as possible for a visit in 1 week.   Specialty:  Obstetrics and Gynecology   Contact information:   [address] [place] [address] [phone]         Total Time spent on discharge equals 45 minutes.  SignedJeoffrey Massed: Dorthy Hustead 11/19/2013 10:53 AM

## 2013-11-19 NOTE — Progress Notes (Signed)
Patient discharge home with discharge instruction given.  Patient is comfortable, no sign and symtom of distress upon discharge.

## 2013-11-23 ENCOUNTER — Encounter: Payer: Medicaid Other | Admitting: Obstetrics and Gynecology

## 2013-11-23 LAB — QUANTIFERON TB GOLD ASSAY (BLOOD)
Mitogen value: 0.46 IU/mL
Quantiferon Nil Value: 0.04 IU/mL
TB Ag value: 0.03 IU/mL
TB Antigen Minus Nil Value: 0 IU/mL

## 2013-11-23 LAB — CULTURE, BLOOD (ROUTINE X 2)
CULTURE: NO GROWTH
Culture: NO GROWTH

## 2013-11-30 ENCOUNTER — Encounter: Payer: Medicaid Other | Admitting: Obstetrics & Gynecology

## 2013-12-02 ENCOUNTER — Encounter: Payer: Self-pay | Admitting: Obstetrics and Gynecology

## 2013-12-02 ENCOUNTER — Encounter: Payer: Medicaid Other | Admitting: Obstetrics and Gynecology

## 2013-12-02 ENCOUNTER — Ambulatory Visit (INDEPENDENT_AMBULATORY_CARE_PROVIDER_SITE_OTHER): Payer: Medicaid Other | Admitting: Obstetrics and Gynecology

## 2013-12-02 VITALS — BP 107/77 | HR 93 | Temp 98.5°F | Resp 20 | Ht 61.0 in | Wt 147.0 lb

## 2013-12-02 DIAGNOSIS — N8320 Unspecified ovarian cysts: Secondary | ICD-10-CM

## 2013-12-02 DIAGNOSIS — N83202 Unspecified ovarian cyst, left side: Secondary | ICD-10-CM

## 2013-12-02 MED ORDER — OXYCODONE HCL 5 MG/5ML PO SOLN: 5.0000 mg | Freq: Four times a day (QID) | ORAL | Status: DC | PRN

## 2013-12-02 NOTE — Progress Notes (Signed)
Pt states she has been having severe pain for 3 days. She does not have any pain medication.

## 2013-12-02 NOTE — Progress Notes (Signed)
Patient ID: Kara Allen, female   DOB: 10/01/1980, 33 y.o.   MRN: [id number] 33 yo [id number] presenting today as an MAU follow up for the evaluation and treatment of a left complex ovarian cyst. Patient reports persistent pain since her October visit. She rates her pain as 10/10 daily with radiation to her lower back. Pain medication alleviates her pain. Movement aggravates her pain.   Past Medical History  Diagnosis Date  . HIV (human immunodeficiency virus infection)   . Trichomonas infection   . Pyelonephritis   . Recurrent boils   . Urinary frequency     and nocturia  . Hydronephrosis, left   . Cyst of ovary     PT STATES SHE IS TAKING ANTIBIOTICS FOR CYST ON OVARY   Past Surgical History  Procedure Laterality Date  . Cesarean section  2006  . Cystoscopy with retrograde pyelogram, ureteroscopy and stent placement Bilateral 10/28/2013    Procedure: CYSTOSCOPY WITH RETROGRADE PYELOGRAM, CYSTOGRAM;  Surgeon: Valetta Fulleravid S Grapey, MD;  Location: [facility];  Service: Urology;  Laterality: Bilateral;   Family History  Problem Relation Age of Onset  . HIV Mother    History  Substance Use Topics  . Smoking status: Current Every Day Smoker -- 0.15 packs/day for 14 years    Types: Cigarettes, Cigars    Last Attempt to Quit: 06/13/2013  . Smokeless tobacco: Never Used     Comment: smoking 1-2 black and milds per day  . Alcohol Use: No     Comment: no longer smokes cigarettes - does smoke tobacco product called blackmilds   GENERAL: Well-developed, well-nourished female in no acute distress.  ABDOMEN: Soft, bilateral lower quadrant tenderness, no rebound, no guarding, nondistended. No organomegaly. PELVIC: Normal external female genitalia. Vagina is pink and rugated.  Normal discharge. Normal appearing cervix. Uterus is normal in size. Bilateral adnexal tenderness. EXTREMITIES: No cyanosis, clubbing, or edema, 2+ distal pulses.   Ultrasound FINDINGS: Uterus Normal appearance of the  anteverted uterus measuring approximately 7.8 x 3.6 x 4.0 cm. No discrete uterine mass.   Endometrium Normal in size measures 6.5 mm in diameter (image 31).   Right ovary Normal in size and appearance measuring 3.2 x 1.6 x 2.3 cm.   Left ovary The left ovary is enlarged measuring 4.9 x 3.0 x 4.3 cm.   There is an approximately 2.2 x 2.9 x 2.5 cm complex partially cystic lesion within the left ovary which is noted to contain thickened irregular septations (representative images 53 and 54) which demonstrate internal blood flow (image 42), characteristics worrisome for malignancy.   Note is also made of an approximately 3.5 x 2.5 x 3.2 cm anechoic partially exophytic simple cyst within the left ovary.   Other findings No free fluid.   IMPRESSION: Approximately 2.9 cm left-sided complex partially cystic lesion contains thickened irregular enhancing septations, characteristics worrisome for malignancy.   As such[facility].   These results will be called to the ordering clinician or representative by the Radiologist Assistant, and communication documented in the PACS or zVision Dashboard.   Electronically Signed By: Simonne ComeJohn Watts M.D. On: 10/12/2013 19:26   A/P 33 yo with left ovarian cyst - Will obtain ca-125 - Discussed laparoscopic left oophorectomy (pending CA-125 level). Risks, benefits and alternatives were explained including but not limited to risks of bleeding, infection and damage to adjacent organs. Patient verbalized understanding and all questions were answered - Refill on oxycodone provided - Patient will be  scheduled for surgery

## 2013-12-03 ENCOUNTER — Other Ambulatory Visit: Payer: Self-pay | Admitting: Obstetrics and Gynecology

## 2013-12-03 LAB — CA 125: CA 125: 3 U/mL (ref <35)

## 2013-12-08 ENCOUNTER — Telehealth: Payer: Self-pay | Admitting: *Deleted

## 2013-12-08 ENCOUNTER — Ambulatory Visit: Payer: Medicaid Other | Admitting: Internal Medicine

## 2013-12-08 DIAGNOSIS — R102 Pelvic and perineal pain: Secondary | ICD-10-CM

## 2013-12-08 DIAGNOSIS — Z8619 Personal history of other infectious and parasitic diseases: Secondary | ICD-10-CM

## 2013-12-08 MED ORDER — OXYCODONE HCL 5 MG/5ML PO SOLN: 5.0000 mg | Freq: Four times a day (QID) | ORAL | Status: DC | PRN

## 2013-12-08 NOTE — Telephone Encounter (Signed)
Notified patient of missed appointment today. She states she didn't know about it, that she only has transportation on Wednesdays and Thursdays.  Patient states she stopped her antibiotics after discharge from the hospital (did not call anyone to let us know).  When questioned, states she feels like she still has pain and sometimes dribbles urine; denies fever (feels chilly at times, doesn't have a thermometer).  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  She knows about her upcoming lab appointment/follow up with [facility].  Will come to those, have a UA per Dr. Luciana Axeomer at next lab visit.  Pt knows to call us if anything changes, symptoms recur. Andree CossHowell, Sena Clouatre M, RN

## 2013-12-08 NOTE — Telephone Encounter (Signed)
Pt left message requesting results and Rx refill.

## 2013-12-08 NOTE — Telephone Encounter (Signed)
Called patient and informed her of results. Patient verbalized understanding and asked about a refill on her pain medication and asked what they were going to do about this pain. Asked patient that didn't she and Dr Jolayne Pantheronstant discuss surgery. Patient states yes she thinks so but isn't really sure what the plan is. Told patient that reading through her chart it looks like the plan is to remove her left ovary which then hopefully would stop her pain since that seems to be where it is coming from and she will receive a letter in the mail confirming surgery date and time. Patient verbalized understanding. Told patient that whenever I hear back from Dr Jolayne Pantheronstant about the refill I will call her back. Patient verbalized understanding and had no other questions. Obtained refill for 120mL on oxycodone for patient that is to last until her surgery on 1/5. Patient should alternative between oxycodone and ibuprofen. Called patient back, no answer- left message stating she has a prescription to pick up in our office and to call back if she has questions

## 2013-12-15 ENCOUNTER — Encounter: Payer: Self-pay | Admitting: *Deleted

## 2013-12-17 ENCOUNTER — Other Ambulatory Visit (HOSPITAL_COMMUNITY)
Admission: RE | Admit: 2013-12-17 | Discharge: 2013-12-17 | Disposition: A | Discharge status: Home / Self Care | Payer: Medicaid Other | Source: Ambulatory Visit | Attending: Internal Medicine | Admitting: Internal Medicine

## 2013-12-17 ENCOUNTER — Other Ambulatory Visit: Payer: Medicaid Other

## 2013-12-17 DIAGNOSIS — B20 Human immunodeficiency virus [HIV] disease: Secondary | ICD-10-CM

## 2013-12-17 DIAGNOSIS — Z8619 Personal history of other infectious and parasitic diseases: Secondary | ICD-10-CM

## 2013-12-17 DIAGNOSIS — Z113 Encounter for screening for infections with a predominantly sexual mode of transmission: Secondary | ICD-10-CM | POA: Diagnosis present

## 2013-12-17 LAB — COMPLETE METABOLIC PANEL WITH GFR
ALK PHOS: 73 U/L (ref 39–117)
ALT: 9 U/L (ref 0–35)
AST: 21 U/L (ref 0–37)
Albumin: 3.1 g/dL — ABNORMAL LOW (ref 3.5–5.2)
BUN: 12 mg/dL (ref 6–23)
CO2: 25 mEq/L (ref 19–32)
Calcium: 9 mg/dL (ref 8.4–10.5)
Chloride: 103 mEq/L (ref 96–112)
Creat: 0.6 mg/dL (ref 0.50–1.10)
GFR, Est African American: >89 mL/min
GFR, Est Non African American: >89 mL/min
Glucose, Bld: 86 mg/dL (ref 70–99)
POTASSIUM: 4.2 meq/L (ref 3.5–5.3)
SODIUM: 135 meq/L (ref 135–145)
TOTAL PROTEIN: 8.6 g/dL — AB (ref 6.0–8.3)
Total Bilirubin: 0.2 mg/dL (ref 0.2–1.2)

## 2013-12-17 LAB — URINALYSIS
Bilirubin Urine: NEGATIVE
GLUCOSE, UA: NEGATIVE mg/dL
Ketones, ur: NEGATIVE mg/dL
Nitrite: NEGATIVE
PH: 7 (ref 5.0–8.0)
Protein, ur: NEGATIVE mg/dL
Specific Gravity, Urine: 1.019 (ref 1.005–1.030)
Urobilinogen, UA: 0.2 mg/dL (ref 0.0–1.0)

## 2013-12-17 LAB — CBC WITH DIFFERENTIAL/PLATELET
BASOS PCT: 1 % (ref 0–1)
Basophils Absolute: 0 10*3/uL (ref 0.0–0.1)
EOS ABS: 0 10*3/uL (ref 0.0–0.7)
Eosinophils Relative: 1 % (ref 0–5)
HEMATOCRIT: 33.8 % — AB (ref 36.0–46.0)
Hemoglobin: 11 g/dL — ABNORMAL LOW (ref 12.0–15.0)
Lymphocytes Relative: 39 % (ref 12–46)
Lymphs Abs: 1.3 10*3/uL (ref 0.7–4.0)
MCH: 29.7 pg (ref 26.0–34.0)
MCHC: 32.5 g/dL (ref 30.0–36.0)
MCV: 91.4 fL (ref 78.0–100.0)
MONO ABS: 0.3 10*3/uL (ref 0.1–1.0)
MONOS PCT: 8 % (ref 3–12)
MPV: 10.6 fL (ref 9.4–12.4)
NEUTROS ABS: 1.7 10*3/uL (ref 1.7–7.7)
Neutrophils Relative %: 51 % (ref 43–77)
Platelets: 247 10*3/uL (ref 150–400)
RBC: 3.7 MIL/uL — ABNORMAL LOW (ref 3.87–5.11)
RDW: 14.6 % (ref 11.5–15.5)
WBC: 3.4 10*3/uL — ABNORMAL LOW (ref 4.0–10.5)

## 2013-12-17 LAB — RPR

## 2013-12-18 LAB — URINE CYTOLOGY ANCILLARY ONLY
Chlamydia: NEGATIVE
NEISSERIA GONORRHEA: NEGATIVE

## 2013-12-18 LAB — HIV-1 RNA QUANT-NO REFLEX-BLD
HIV 1 RNA Quant: <20 copies/mL (ref <20)
HIV-1 RNA Quant, Log: <1.3 {Log} (ref <1.30)

## 2013-12-18 LAB — T-HELPER CELL (CD4) - (RCID CLINIC ONLY)
CD4 T CELL ABS: 150 /uL — AB (ref 400–2700)
CD4 T CELL HELPER: 12 % — AB (ref 33–55)

## 2013-12-22 ENCOUNTER — Other Ambulatory Visit: Payer: Self-pay | Admitting: Obstetrics and Gynecology

## 2013-12-22 ENCOUNTER — Telehealth: Payer: Self-pay | Admitting: *Deleted

## 2013-12-22 DIAGNOSIS — R102 Pelvic and perineal pain: Secondary | ICD-10-CM

## 2013-12-22 MED ORDER — OXYCODONE HCL 5 MG/5ML PO SOLN: 5.0000 mg | Freq: Four times a day (QID) | ORAL | Status: DC | PRN

## 2013-12-22 NOTE — Telephone Encounter (Addendum)
Pt left message requesting pain medication refill.  She states she is supposed to have surgery on 01/05/14 and has severe pain from her ovaries.  Request routed to Dr. Jolayne Pantheronstant for review.  1200  Called pt and informed her that a refill has been approved by Dr. Jolayne Pantheronstant. It will need to last her until surgery on 01/05/14. She may pick up her prescription at our front office today until 4:30.            Pt voiced understanding.

## 2013-12-30 ENCOUNTER — Encounter (HOSPITAL_COMMUNITY): Payer: Self-pay

## 2013-12-30 ENCOUNTER — Inpatient Hospital Stay (HOSPITAL_COMMUNITY): Admission: RE | Admit: 2013-12-30 | Payer: Medicaid Other | Source: Ambulatory Visit

## 2013-12-30 ENCOUNTER — Encounter (HOSPITAL_COMMUNITY)
Admission: RE | Admit: 2013-12-30 | Discharge: 2013-12-30 | Disposition: A | Discharge status: Home / Self Care | Payer: Medicaid Other | Source: Ambulatory Visit | Attending: Obstetrics and Gynecology | Admitting: Obstetrics and Gynecology

## 2013-12-30 DIAGNOSIS — Z01812 Encounter for preprocedural laboratory examination: Secondary | ICD-10-CM | POA: Diagnosis not present

## 2013-12-30 LAB — CBC
HEMATOCRIT: 35.1 % — AB (ref 36.0–46.0)
HEMOGLOBIN: 11.8 g/dL — AB (ref 12.0–15.0)
MCH: 30.4 pg (ref 26.0–34.0)
MCHC: 33.6 g/dL (ref 30.0–36.0)
MCV: 90.5 fL (ref 78.0–100.0)
Platelets: 232 10*3/uL (ref 150–400)
RBC: 3.88 MIL/uL (ref 3.87–5.11)
RDW: 15.2 % (ref 11.5–15.5)
WBC: 4.4 10*3/uL (ref 4.0–10.5)

## 2013-12-30 NOTE — Patient Instructions (Addendum)
Your procedure is scheduled on:01/05/14  Enter through the Main Entrance at :9am Pick up desk phone and dial [phone] and inform us of your arrival.  Please call [phone] if you have any problems the morning of surgery.  Remember: Do not eat food or drink liquids, including water, after midnight:Monday    You may brush your teeth the morning of surgery.  Take these meds the morning of surgery with a sip of water:pain med if needed  DO NOT wear jewelry, eye make-up, lipstick,body lotion, or dark fingernail polish.  (Polished toes are ok) You may wear deodorant.  If you are to be admitted after surgery, leave suitcase in car until your room has been assigned. Patients discharged on the day of surgery will not be allowed to drive home. Wear loose fitting, comfortable clothes for your ride home.

## 2013-12-30 NOTE — Pre-Procedure Instructions (Signed)
patient complained that she was in 10 + pain and had run out of pain medicine. She was moaning and doubled over. [facility] asking that she relay message to Dr. Jolayne Pantheronstant. Later I read in chart that Dr. Jolayne Pantheronstant had refilled pain med recently and told pt no more refills until her surgery on 01/05/14. I didn't mention that to patient. At end of PAT appt pt seemed to  feel better...she was laughing and sitting up straight and walked to lab without problems.

## 2013-12-31 NOTE — Pre-Procedure Instructions (Addendum)
Dr. Malen GauzeFoster notified of pt's marijuana use and he does not need her to have a drug screen. I advised pt not to smoke on the am of surgery.

## 2014-01-04 ENCOUNTER — Encounter: Payer: Self-pay | Admitting: *Deleted

## 2014-01-04 NOTE — H&P (Signed)
Kara Allen is an 34 y.o. female G2P1011 presenting today for scheduled left oophorectomy secondary to the presence of a 3 cm complex left ovarian cyst and left sided pelvic pain. Patient with long standing HIV infection with undetectable viral load. Patient is sexually active using condoms for contraception  No LMP recorded.    Past Medical History  Diagnosis Date  . HIV (human immunodeficiency virus infection)   . Trichomonas infection   . Pyelonephritis   . Recurrent boils   . Urinary frequency     and nocturia  . Hydronephrosis, left   . Cyst of ovary     PT STATES SHE IS TAKING ANTIBIOTICS FOR CYST ON OVARY    Past Surgical History  Procedure Laterality Date  . Cesarean section  2006  . Cystoscopy with retrograde pyelogram, ureteroscopy and stent placement Bilateral 10/28/2013    Procedure: CYSTOSCOPY WITH RETROGRADE PYELOGRAM, CYSTOGRAM;  Surgeon: Valetta Fuller, MD;  Location: [facility];  Service: Urology;  Laterality: Bilateral;    Family History  Problem Relation Age of Onset  . HIV Mother     Social History:  reports that she has been smoking Cigarettes and Cigars.  She has a 2.1 pack-year smoking history. She has never used smokeless tobacco. She reports that she uses illicit drugs (Marijuana) about 5 times per week. She reports that she does not drink alcohol.  Allergies:  Allergies  Allergen Reactions  . Advil [Ibuprofen] Rash and Other (See Comments)    "Irritates my bladder"  . Latex Other (See Comments)    If pt wears latex gloves or any contact with latex causes itching and rash. Some tapes also cause itching, skin irritation - paper tape seems to be okay  . Sulfamethoxazole-Trimethoprim Other (See Comments)    Rash, chills and hives  . Tylenol [Acetaminophen] Other (See Comments)    Makes me pee more- irritates bladder    Prescriptions prior to admission  Medication Sig Dispense Refill Last Dose  . dolutegravir (TIVICAY) 50 MG tablet Take 50 mg by  mouth at bedtime.   01/04/2014 at Unknown time  . lamiVUDine (EPIVIR) 10 MG/ML solution Take 30 mg by mouth at bedtime.    01/04/2014 at Unknown time  . oxyCODONE (ROXICODONE) 5 MG/5ML solution Take 5 mLs (5 mg total) by mouth every 6 (six) hours as needed for moderate pain. 120 mL 0 Past Week at Unknown time  . rilpivirine (EDURANT) 25 MG TABS tablet Take 25 mg by mouth at bedtime.   01/04/2014 at Unknown time  . amoxicillin (AMOXIL) 500 MG capsule Take 1 capsule (500 mg total) by mouth every 8 (eight) hours. (Patient not taking: Reported on 12/02/2013) 24 capsule 0 Not Taking  . doxycycline (VIBRAMYCIN) 100 MG capsule Take 1 capsule (100 mg total) by mouth 2 (two) times daily. (Patient not taking: Reported on 12/02/2013) 28 capsule 0 Not Taking    Review of Systems  All other systems reviewed and are negative.   Blood pressure 114/73, pulse 77, temperature 98.1 F (36.7 C), temperature source Oral, resp. rate 18, SpO2 100 %. Physical Exam GENERAL: Well-developed, well-nourished female in no acute distress.  HEENT: Normocephalic, atraumatic. Sclerae anicteric.  NECK: Supple. Normal thyroid.  LUNGS: Clear to auscultation bilaterally.  HEART: Regular rate and rhythm. ABDOMEN: Soft, nontender, nondistended.  PELVIC: Defered to OR EXTREMITIES: No cyanosis, clubbing, or edema, 2+ distal pulses.  Results for orders placed or performed during the hospital encounter of 01/05/14 (from the past 24 hour(s))  Pregnancy, urine     Status: None   Collection Time: 01/05/14  6:00 AM  Result Value Ref Range   Preg Test, Ur NEGATIVE NEGATIVE    No results found. Ultrasound 10/12/2013 FINDINGS: Uterus Normal appearance of the anteverted uterus measuring approximately 7.8 x 3.6 x 4.0 cm. No discrete uterine mass.   Endometrium Normal in size measures 6.5 mm in diameter (image 31).   Right ovary Normal in size and appearance measuring 3.2 x 1.6 x 2.3 cm.    Left ovary The left ovary is enlarged measuring 4.9 x 3.0 x 4.3 cm.   There is an approximately 2.2 x 2.9 x 2.5 cm complex partially cystic lesion within the left ovary which is noted to contain thickened irregular septations (representative images 53 and 54) which demonstrate internal blood flow (image 42), characteristics worrisome for malignancy.   Note is also made of an approximately 3.5 x 2.5 x 3.2 cm anechoic partially exophytic simple cyst within the left ovary.   Other findings No free fluid.   IMPRESSION: Approximately 2.9 cm left-sided complex partially cystic lesion contains thickened irregular enhancing septations, characteristics worrisome for malignancy.     Assessment/Plan: 34 yo G2P1011 with LLQ pain and a 3 cm complex left ovarian cyst here for scheduled laparoscopic left salpingo-oophorectomy - Risks, benefits and alternatives were explained including but not limited to risks of bleeding, infection and damage to adjacent organs. Patient verbalized understanding and all questions were answered. It was explained to the patient that her ability to conceive was not decreased and that she should continue using contraception  Kameisha Malicki 01/05/2014, 7:14 AM

## 2014-01-04 NOTE — OR Nursing (Signed)
Called and spoke with pt and instructed her that surgery moved to 0730 and she needs to be here at 0600.

## 2014-01-05 ENCOUNTER — Ambulatory Visit (HOSPITAL_COMMUNITY)
Admission: RE | Admit: 2014-01-05 | Discharge: 2014-01-05 | Disposition: A | Discharge status: Home / Self Care | Payer: Medicaid Other | Source: Ambulatory Visit | Attending: Obstetrics and Gynecology | Admitting: Obstetrics and Gynecology

## 2014-01-05 ENCOUNTER — Encounter (HOSPITAL_COMMUNITY)
Admission: RE | Disposition: A | Discharge status: Home / Self Care | Payer: Self-pay | Source: Ambulatory Visit | Attending: Obstetrics and Gynecology

## 2014-01-05 ENCOUNTER — Encounter (HOSPITAL_COMMUNITY): Payer: Self-pay | Admitting: *Deleted

## 2014-01-05 ENCOUNTER — Ambulatory Visit (HOSPITAL_COMMUNITY): Payer: Medicaid Other | Admitting: Anesthesiology

## 2014-01-05 DIAGNOSIS — N83202 Unspecified ovarian cyst, left side: Secondary | ICD-10-CM | POA: Insufficient documentation

## 2014-01-05 DIAGNOSIS — F329 Major depressive disorder, single episode, unspecified: Secondary | ICD-10-CM | POA: Diagnosis not present

## 2014-01-05 DIAGNOSIS — F17210 Nicotine dependence, cigarettes, uncomplicated: Secondary | ICD-10-CM | POA: Insufficient documentation

## 2014-01-05 DIAGNOSIS — N8320 Unspecified ovarian cysts: Secondary | ICD-10-CM

## 2014-01-05 DIAGNOSIS — R102 Pelvic and perineal pain: Secondary | ICD-10-CM

## 2014-01-05 DIAGNOSIS — B20 Human immunodeficiency virus [HIV] disease: Secondary | ICD-10-CM | POA: Diagnosis not present

## 2014-01-05 DIAGNOSIS — R1032 Left lower quadrant pain: Secondary | ICD-10-CM | POA: Diagnosis present

## 2014-01-05 DIAGNOSIS — Z79899 Other long term (current) drug therapy: Secondary | ICD-10-CM | POA: Diagnosis not present

## 2014-01-05 DIAGNOSIS — N9489 Other specified conditions associated with female genital organs and menstrual cycle: Secondary | ICD-10-CM | POA: Insufficient documentation

## 2014-01-05 HISTORY — PX: LAPAROSCOPIC UNILATERAL SALPINGO OOPHERECTOMY: SHX5935

## 2014-01-05 LAB — PREGNANCY, URINE: Preg Test, Ur: NEGATIVE

## 2014-01-05 SURGERY — SALPINGO-OOPHORECTOMY, UNILATERAL, LAPAROSCOPIC
Anesthesia: General | Site: Abdomen | Laterality: Left

## 2014-01-05 MED ORDER — GLYCOPYRROLATE 0.2 MG/ML IJ SOLN
INTRAMUSCULAR | Status: AC
  Filled 2014-01-05: qty 3

## 2014-01-05 MED ORDER — SCOPOLAMINE 1 MG/3DAYS TD PT72
1.0000 | MEDICATED_PATCH | Freq: Once | TRANSDERMAL | Status: AC
  Administered 2014-01-05: 1 via TRANSDERMAL
  Administered 2014-01-05: 1.5 mg via TRANSDERMAL

## 2014-01-05 MED ORDER — CEFAZOLIN SODIUM-DEXTROSE 2-3 GM-% IV SOLR
INTRAVENOUS | Status: AC
  Filled 2014-01-05: qty 50

## 2014-01-05 MED ORDER — FENTANYL CITRATE 0.05 MG/ML IJ SOLN
INTRAMUSCULAR | Status: DC | PRN
  Administered 2014-01-05 (×2): 100 ug via INTRAVENOUS
  Administered 2014-01-05: 50 ug via INTRAVENOUS
  Administered 2014-01-05: 100 ug via INTRAVENOUS

## 2014-01-05 MED ORDER — LIDOCAINE HCL (CARDIAC) 20 MG/ML IV SOLN
INTRAVENOUS | Status: DC | PRN
  Administered 2014-01-05: 80 mg via INTRAVENOUS

## 2014-01-05 MED ORDER — NEOSTIGMINE METHYLSULFATE 10 MG/10ML IV SOLN
INTRAVENOUS | Status: AC
  Filled 2014-01-05: qty 1

## 2014-01-05 MED ORDER — HYDROMORPHONE HCL 1 MG/ML IJ SOLN
INTRAMUSCULAR | Status: AC
  Filled 2014-01-05: qty 1

## 2014-01-05 MED ORDER — FENTANYL CITRATE 0.05 MG/ML IJ SOLN: 25.0000 ug | INTRAMUSCULAR | Status: DC | PRN

## 2014-01-05 MED ORDER — ROCURONIUM BROMIDE 100 MG/10ML IV SOLN
INTRAVENOUS | Status: AC
  Filled 2014-01-05: qty 1

## 2014-01-05 MED ORDER — FENTANYL CITRATE 0.05 MG/ML IJ SOLN
INTRAMUSCULAR | Status: AC
  Filled 2014-01-05: qty 5

## 2014-01-05 MED ORDER — GLYCOPYRROLATE 0.2 MG/ML IJ SOLN
INTRAMUSCULAR | Status: DC | PRN
  Administered 2014-01-05: 0.2 mg via INTRAVENOUS

## 2014-01-05 MED ORDER — LIDOCAINE HCL (CARDIAC) 20 MG/ML IV SOLN
INTRAVENOUS | Status: AC
  Filled 2014-01-05: qty 5

## 2014-01-05 MED ORDER — SCOPOLAMINE 1 MG/3DAYS TD PT72
MEDICATED_PATCH | TRANSDERMAL | Status: AC
  Filled 2014-01-05: qty 1

## 2014-01-05 MED ORDER — NEOSTIGMINE METHYLSULFATE 10 MG/10ML IV SOLN
INTRAVENOUS | Status: DC | PRN
  Administered 2014-01-05: 3 mg via INTRAVENOUS

## 2014-01-05 MED ORDER — BUPIVACAINE HCL (PF) 0.25 % IJ SOLN
INTRAMUSCULAR | Status: AC
  Filled 2014-01-05: qty 30

## 2014-01-05 MED ORDER — MEPERIDINE HCL 25 MG/ML IJ SOLN: 6.2500 mg | INTRAMUSCULAR | Status: DC | PRN

## 2014-01-05 MED ORDER — OXYCODONE HCL 5 MG/5ML PO SOLN: 5.0000 mg | Freq: Four times a day (QID) | ORAL | Status: DC | PRN

## 2014-01-05 MED ORDER — ONDANSETRON HCL 4 MG/2ML IJ SOLN
INTRAMUSCULAR | Status: DC | PRN
  Administered 2014-01-05: 4 mg via INTRAVENOUS

## 2014-01-05 MED ORDER — ONDANSETRON HCL 4 MG/2ML IJ SOLN
INTRAMUSCULAR | Status: AC
  Filled 2014-01-05: qty 2

## 2014-01-05 MED ORDER — DOCUSATE SODIUM 100 MG PO CAPS: 100.0000 mg | ORAL_CAPSULE | Freq: Two times a day (BID) | ORAL | Status: DC | PRN

## 2014-01-05 MED ORDER — METOCLOPRAMIDE HCL 5 MG/ML IJ SOLN: 10.0000 mg | Freq: Once | INTRAMUSCULAR | Status: DC | PRN

## 2014-01-05 MED ORDER — PROPOFOL 10 MG/ML IV BOLUS
INTRAVENOUS | Status: DC | PRN
  Administered 2014-01-05: 180 mg via INTRAVENOUS

## 2014-01-05 MED ORDER — LACTATED RINGERS IV SOLN
INTRAVENOUS | Status: DC
  Administered 2014-01-05: 08:00:00 via INTRAVENOUS
  Administered 2014-01-05: 1000 mL via INTRAVENOUS

## 2014-01-05 MED ORDER — OXYCODONE HCL 5 MG PO TABS
ORAL_TABLET | ORAL | Status: AC
  Filled 2014-01-05: qty 1

## 2014-01-05 MED ORDER — DEXAMETHASONE SODIUM PHOSPHATE 10 MG/ML IJ SOLN
INTRAMUSCULAR | Status: AC
  Filled 2014-01-05: qty 1

## 2014-01-05 MED ORDER — PROPOFOL 10 MG/ML IV EMUL
INTRAVENOUS | Status: AC
  Filled 2014-01-05: qty 20

## 2014-01-05 MED ORDER — OXYCODONE HCL 5 MG PO TABS
5.0000 mg | ORAL_TABLET | Freq: Once | ORAL | Status: AC | PRN
  Administered 2014-01-05: 5 mg via ORAL

## 2014-01-05 MED ORDER — BUPIVACAINE HCL (PF) 0.25 % IJ SOLN
INTRAMUSCULAR | Status: DC | PRN
  Administered 2014-01-05: 10 mL

## 2014-01-05 MED ORDER — ROCURONIUM BROMIDE 100 MG/10ML IV SOLN
INTRAVENOUS | Status: DC | PRN
  Administered 2014-01-05: 35 mg via INTRAVENOUS

## 2014-01-05 MED ORDER — MIDAZOLAM HCL 2 MG/2ML IJ SOLN
INTRAMUSCULAR | Status: DC | PRN
  Administered 2014-01-05: 2 mg via INTRAVENOUS

## 2014-01-05 MED ORDER — FENTANYL CITRATE 0.05 MG/ML IJ SOLN
INTRAMUSCULAR | Status: AC
  Filled 2014-01-05: qty 2

## 2014-01-05 MED ORDER — MIDAZOLAM HCL 2 MG/2ML IJ SOLN
INTRAMUSCULAR | Status: AC
  Filled 2014-01-05: qty 2

## 2014-01-05 MED ORDER — OXYCODONE HCL 5 MG/5ML PO SOLN: 5.0000 mg | Freq: Once | ORAL | Status: AC | PRN

## 2014-01-05 MED ORDER — HYDROMORPHONE HCL 1 MG/ML IJ SOLN
INTRAMUSCULAR | Status: DC | PRN
  Administered 2014-01-05: 2 mg via INTRAVENOUS

## 2014-01-05 SURGICAL SUPPLY — 28 items
BLADE SURG 11 STRL SS (BLADE) ×3 IMPLANT
CHLORAPREP W/TINT 26ML (MISCELLANEOUS) ×3 IMPLANT
DRSG COVADERM PLUS 2X2 (GAUZE/BANDAGES/DRESSINGS) ×6 IMPLANT
DRSG OPSITE POSTOP 3X4 (GAUZE/BANDAGES/DRESSINGS) IMPLANT
ELECT REM PT RETURN 9FT ADLT (ELECTROSURGICAL)
ELECTRODE REM PT RTRN 9FT ADLT (ELECTROSURGICAL) IMPLANT
GLOVE BIOGEL PI IND STRL 6.5 (GLOVE) ×1 IMPLANT
GLOVE BIOGEL PI INDICATOR 6.5 (GLOVE) ×2
GLOVE SURG SS PI 6.0 STRL IVOR (GLOVE) ×3 IMPLANT
GOWN STRL REUS W/TWL LRG LVL3 (GOWN DISPOSABLE) ×6 IMPLANT
LIQUID BAND (GAUZE/BANDAGES/DRESSINGS) ×3 IMPLANT
NS IRRIG 1000ML POUR BTL (IV SOLUTION) ×3 IMPLANT
PACK LAPAROSCOPY BASIN (CUSTOM PROCEDURE TRAY) ×3 IMPLANT
PAD POSITIONER PINK NONSTERILE (MISCELLANEOUS) ×3 IMPLANT
POUCH SPECIMEN RETRIEVAL 10MM (ENDOMECHANICALS) IMPLANT
PROTECTOR NERVE ULNAR (MISCELLANEOUS) ×3 IMPLANT
SEALER TISSUE G2 CVD JAW 35 (ENDOMECHANICALS) IMPLANT
SEALER TISSUE G2 CVD JAW 45CM (ENDOMECHANICALS)
SET IRRIG TUBING LAPAROSCOPIC (IRRIGATION / IRRIGATOR) IMPLANT
SHEARS HARMONIC ACE PLUS 36CM (ENDOMECHANICALS) IMPLANT
SUT MNCRL AB 4-0 PS2 18 (SUTURE) ×3 IMPLANT
SUT VICRYL 0 UR6 27IN ABS (SUTURE) ×6 IMPLANT
TOWEL OR 17X24 6PK STRL BLUE (TOWEL DISPOSABLE) ×6 IMPLANT
TRAY FOLEY BAG SILVER LF 16FR (CATHETERS) ×3 IMPLANT
TRAY FOLEY CATH 14FR (SET/KITS/TRAYS/PACK) IMPLANT
TROCAR BALLN 12MMX100 BLUNT (TROCAR) ×3 IMPLANT
TROCAR XCEL NON-BLD 5MMX100MML (ENDOMECHANICALS) IMPLANT
WATER STERILE IRR 1000ML POUR (IV SOLUTION) ×3 IMPLANT

## 2014-01-05 NOTE — Discharge Instructions (Signed)
Diagnostic Laparoscopy °Laparoscopy is a surgical procedure. It is used to diagnose and treat diseases inside the belly (abdomen). It is usually a brief, common, and relatively simple procedure. The laparoscopeis a thin, lighted, pencil-sized instrument. It is like a telescope. It is inserted into your abdomen through a small cut (incision). Your caregiver can look at the organs inside your body through this instrument. He or she can see if there is anything abnormal. °Laparoscopy can be done either in a hospital or outpatient clinic. You may be given a mild sedative to help you relax before the procedure. Once in the operating room, you will be given a drug to make you sleep (general anesthesia). Laparoscopy usually lasts less than 1 hour. After the procedure, you will be monitored in a recovery area until you are stable and doing well. Once you are home, it will take 2 to 3 days to fully recover. °RISKS AND COMPLICATIONS  °Laparoscopy has relatively few risks. Your caregiver will discuss the risks with you before the procedure. °Some problems that can occur include: °· Infection. °· Bleeding. °· Damage to other organs. °· Anesthetic side effects. °PROCEDURE °Once you receive anesthesia, your surgeon inflates the abdomen with a harmless gas (carbon dioxide). This makes the organs easier to see. The laparoscope is inserted into the abdomen through a small incision. This allows your surgeon to see into the abdomen. Other small instruments are also inserted into the abdomen through other small openings. Many surgeons attach a video camera to the laparoscope to enlarge the view. °During a diagnostic laparoscopy, the surgeon may be looking for inflammation, infection, or cancer. Your surgeon may take tissue samples(biopsies). The samples are sent to a specialist in looking at cells and tissue samples (pathologist). The pathologist examines them under a microscope. Biopsies can help to diagnose or confirm a  disease. °AFTER THE PROCEDURE  °· The gas is released from inside the abdomen. °· The incisions are closed with stitches (sutures). Because these incisions are small (usually less than 1/2 inch), there is usually minimal discomfort after the procedure. There may be some mild discomfort in the throat. This is from the tube placed in the throat while you were sleeping. You may have some mild abdominal discomfort. There may also be discomfort from the instrument placement incisions in the abdomen. °· The recovery time is shortened as long as there are no complications. °· You will rest in a recovery room until stable and doing well. As long as there are no complications, you may be allowed to go home. °FINDING OUT THE RESULTS OF YOUR TEST °Not all test results are available during your visit. If your test results are not back during the visit, make an appointment with your caregiver to find out the results. Do not assume everything is normal if you have not heard from your caregiver or the medical facility. It is important for you to follow up on all of your test results. °HOME CARE INSTRUCTIONS  °· Take all medicines as directed. °· Only take over-the-counter or prescription medicines for pain, discomfort, or fever as directed by your caregiver. °· Resume daily activities as directed. °· Showers are preferred over baths. °· You may resume sexual activities in 1 week or as directed. °· Do not drive while taking narcotics. °SEEK MEDICAL CARE IF:  °· There is increasing abdominal pain. °· There is new pain in the shoulders (shoulder strap areas). °· You feel lightheaded or faint. °· You have the chills. °· You or   your child has an oral temperature above 102° F (38.9° C). °· There is pus-like (purulent) drainage from any of the wounds. °· You are unable to pass gas or have a bowel movement. °· You feel sick to your stomach (nauseous) or throw up (vomit). °MAKE SURE YOU:  °· Understand these instructions. °· Will watch  your condition. °· Will get help right away if you are not doing well or get worse. °Document Released: 03/26/2000 Document Revised: 04/14/2012 Document Reviewed: 12/18/2006 °[employer]® Patient Information ©2015 [employer]. This information is not intended to replace advice given to you by your health care provider. Make sure you discuss any questions you have with your health care provider. ° °Post Anesthesia Home Care Instructions ° °Activity: °Get plenty of rest for the remainder of the day. A responsible adult should stay with you for 24 hours following the procedure.  °For the next 24 hours, DO NOT: °-Drive a car °-Operate machinery °-Drink alcoholic beverages °-Take any medication unless instructed by your physician °-Make any legal decisions or sign important papers. ° °Meals: °Start with liquid foods such as gelatin or soup. Progress to regular foods as tolerated. Avoid greasy, spicy, heavy foods. If nausea and/or vomiting occur, drink only clear liquids until the nausea and/or vomiting subsides. Call your physician if vomiting continues. ° °Special Instructions/Symptoms: °Your throat may feel dry or sore from the anesthesia or the breathing tube placed in your throat during surgery. If this causes discomfort, gargle with warm salt water. The discomfort should disappear within 24 hours. ° °

## 2014-01-05 NOTE — Anesthesia Postprocedure Evaluation (Signed)
  Anesthesia Post-op Note  Patient: Kara PresserSharon D Allen  Procedure(s) Performed: Procedure(s): LAPAROSCOPIC LEFT SALPINGO OOPHORECTOMY (Left)  Patient Location: PACU  Anesthesia Type:General  Level of Consciousness: awake, alert  and oriented  Airway and Oxygen Therapy: Patient Spontanous Breathing  Post-op Pain: mild  Post-op Assessment: Post-op Vital signs reviewed, Patient's Cardiovascular Status Stable, Respiratory Function Stable, Patent Airway, No signs of Nausea or vomiting and Pain level controlled  Post-op Vital Signs: Reviewed and stable  Last Vitals:  Filed Vitals:   01/05/14 0915  BP: 95/65  Pulse:   Temp:   Resp:     Complications: No apparent anesthesia complications

## 2014-01-05 NOTE — Anesthesia Preprocedure Evaluation (Signed)
Anesthesia Evaluation  Patient identified by MRN, date of birth, ID band Patient awake    Reviewed: Allergy & Precautions, NPO status , Patient's Chart, lab work & pertinent test results  History of Anesthesia Complications Negative for: history of anesthetic complications  Airway Mallampati: II  TM Distance: >3 FB Neck ROM: Full    Dental no notable dental hx. (+) Dental Advisory Given   Pulmonary Current Smoker,  breath sounds clear to auscultation  Pulmonary exam normal       Cardiovascular Exercise Tolerance: Good negative cardio ROS  Rhythm:Regular Rate:Normal     Neuro/Psych PSYCHIATRIC DISORDERS Anxiety Depression negative neurological ROS     GI/Hepatic negative GI ROS, Neg liver ROS,   Endo/Other  negative endocrine ROS  Renal/GU negative Renal ROS  negative genitourinary   Musculoskeletal negative musculoskeletal ROS (+)   Abdominal   Peds negative pediatric ROS (+)  Hematology  (+) HIV,   Anesthesia Other Findings   Reproductive/Obstetrics negative OB ROS                             Anesthesia Physical Anesthesia Plan  ASA: III  Anesthesia Plan: General   Post-op Pain Management:    Induction: Intravenous  Airway Management Planned: Oral ETT  Additional Equipment:   Intra-op Plan:   Post-operative Plan: Extubation in OR  Informed Consent: I have reviewed the patients History and Physical, chart, labs and discussed the procedure including the risks, benefits and alternatives for the proposed anesthesia with the patient or authorized representative who has indicated his/her understanding and acceptance.   Dental advisory given  Plan Discussed with: CRNA  Anesthesia Plan Comments:         Anesthesia Quick Evaluation

## 2014-01-05 NOTE — Transfer of Care (Signed)
Immediate Anesthesia Transfer of Care Note  Patient: Kara Allen  Procedure(s) Performed: Procedure(s): LAPAROSCOPIC LEFT SALPINGO OOPHORECTOMY (Left)  Patient Location: PACU  Anesthesia Type:General  Level of Consciousness: awake, alert  and oriented  Airway & Oxygen Therapy: Patient Spontanous Breathing and Patient connected to nasal cannula oxygen  Post-op Assessment: Report given to PACU RN, Post -op Vital signs reviewed and stable and Patient moving all extremities X 4  Post vital signs: Reviewed and stable  Complications: No apparent anesthesia complications

## 2014-01-05 NOTE — Op Note (Addendum)
Kara Allen PROCEDURE DATE: 01/05/2014  PREOPERATIVE DIAGNOSES: Complex 3 cm left ovarian cyst and left lower quadrant pain with a normal CA-125 POSTOPERATIVE DIAGNOSES: The same PROCEDURE: Laparoscopic left salpingoophorectomy SURGEON:  Dr. Gigi GinPeggy Abelina Ketron ASSISTANT: None   INDICATIONS: 34 y.o. [id number] with aforementioned preoperative diagnoses here today for definitive surgical management.   Risks of surgery were discussed with the patient including but not limited to: bleeding which may require transfusion or reoperation; infection which may require antibiotics; injury to bowel, bladder, ureters or other surrounding organs; need for additional procedures including laparotomy; thromboembolic phenomenon, incisional problems and other postoperative/anesthesia complications. Written informed consent was obtained.    FINDINGS:  Small uterus, left adnexa with abnormal appearing left ovary (multinodular). Normal right adnexa surrounded by filmy adhesions and found attached to the posterior aspect of the uterus  .  No evidence of endometriosis, or any other abdominal/pelvic abnormality.  Normal upper abdomen.  ANESTHESIA:    General INTRAVENOUS FLUIDS: 1500 ml ESTIMATED BLOOD LOSS: 50 ml SPECIMENS:  leftovary and fallopian tube COMPLICATIONS: None immediate   PROCEDURE IN DETAIL:  The patient was taken to the operating room where general anesthesia was administered and was found to be adequate.  She was placed in the dorsal lithotomy position, and was prepped and draped in a sterile manner.  A Foley catheter was inserted into her bladder and attached to Egan Berkheimer drainage and a uterine manipulator was then advanced into the uterus .  After an adequate timeout was performed, attention was then turned to the patient's abdomen where a 11-mm skin incision was made in the umbilical fold.  The fascia was identified, grasped with Kocher clamps, incised and tagged with 0-Vicryl.  The 11-mm trocar and  sleeve were then advanced without difficulty into the abdomen without difficulty and intraabdominal placement was confirmed by the laparoscope. A survey of the patient's pelvis and abdomen revealed the findings above.   Two 5-mm lower quadrant ports  were then placed under direct visualization on the patient's right side.   On the left side, the uteroovarian ligament was then clamped and transected with the Enseal device.  The left infundibulopelvic ligament was also clamped and transected allowing for salpingooophorectomy.  Excellent hemostasis was noted. The specimen was then removed from the abdomen through the 11-mm port using an Endocatch bag, under direct visualization.  Lysis of the filmy adhesions was performed on the right adnexa, freeing the right fallopian tube. The right ovary remains adherent to the posterior lateral aspect of the uterus.The operative site was surveyed, and it was found to be hemostatic.  No intraoperative injury to other surrounding organs was noted.  The abdomen was desufflated and all instruments were then removed from the patient's abdomen. The fascial incision of the umbilicus was closed with a 0 Vicryl figure of eight stitch.  All skin incisions were closed with 3-0 Vicryl subcuticular stitches and covered with Dermabond.   The patient will be discharged to home as per PACU criteria.  Routine postoperative instructions given.  She was prescribed Oxycodone and Colace.  She will follow up in the clinic in 2 weeks for postoperative evaluation.

## 2014-01-06 ENCOUNTER — Encounter (HOSPITAL_COMMUNITY): Payer: Self-pay | Admitting: Obstetrics and Gynecology

## 2014-01-07 ENCOUNTER — Encounter: Payer: Self-pay | Admitting: Internal Medicine

## 2014-01-07 ENCOUNTER — Ambulatory Visit (INDEPENDENT_AMBULATORY_CARE_PROVIDER_SITE_OTHER): Payer: Medicaid Other | Admitting: Internal Medicine

## 2014-01-07 VITALS — BP 108/71 | HR 121 | Temp 98.4°F | Wt 151.0 lb

## 2014-01-07 DIAGNOSIS — Z21 Asymptomatic human immunodeficiency virus [HIV] infection status: Secondary | ICD-10-CM

## 2014-01-07 DIAGNOSIS — B20 Human immunodeficiency virus [HIV] disease: Secondary | ICD-10-CM

## 2014-01-07 NOTE — Progress Notes (Signed)
   Subjective:    Patient ID: Kara Allen, female    DOB: 05/20/1980, 34 y.o.   MRN: [id number]  HPI She is here for follow-up of HIV.Marland Kitchen.  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  Was taking intermittently though says she kept getting refills from ED by her report.  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 Initial CD4 of 90, vl [id number].  Has had difficulty swallowing pills and started on rilpivirine, tivicay which she is able to swallow and lamivudine with abacavir solution. I dropped the me the name last visit since she otherwise has 3 active drugs.  Her CD4 remains under 200 but viral load remains undetectable. She denies any missed doses.   She also just had cyst removal and is recovering.   Review of Systems  Constitutional: Negative for fever, fatigue and unexpected weight change.  HENT: Negative for trouble swallowing.   Gastrointestinal: Negative for nausea and diarrhea.  Skin: Negative for rash.  Neurological: Negative for dizziness and light-headedness.  Psychiatric/Behavioral: Negative for sleep disturbance.       Objective:   Physical Exam  Constitutional: She appears well-developed and well-nourished. No distress.  HENT:  Mouth/Throat: No oropharyngeal exudate.  Eyes: No scleral icterus.  Cardiovascular: Normal rate, regular rhythm and normal heart sounds.   No murmur heard. Pulmonary/Chest: Effort normal and breath sounds normal. No respiratory distress.  Lymphadenopathy:    She has no cervical adenopathy.  Skin: No rash noted.          Assessment & Plan:

## 2014-01-07 NOTE — Assessment & Plan Note (Signed)
She is doing great on her regimen and remains undetectable. CD4 count is slow to rise but she is taking her medication well.  I will recheck in 3 months and see her after her lab visit.

## 2014-01-21 ENCOUNTER — Ambulatory Visit (INDEPENDENT_AMBULATORY_CARE_PROVIDER_SITE_OTHER): Payer: Medicaid Other | Admitting: Obstetrics and Gynecology

## 2014-01-21 ENCOUNTER — Encounter: Payer: Self-pay | Admitting: Obstetrics and Gynecology

## 2014-01-21 VITALS — BP 104/67 | HR 79 | Temp 98.1°F | Ht 61.0 in | Wt 155.8 lb

## 2014-01-21 DIAGNOSIS — Z9889 Other specified postprocedural states: Secondary | ICD-10-CM

## 2014-01-21 NOTE — Progress Notes (Signed)
Patient ID: Kara PresserSharon D Diesing, female   DOB: 10/12/1980, 34 y.o.   MRN: [id number] 34 yo G2P1011 s/p LSC left salpingo-oophorectomy on 01/05/2014 presenting today for post op check. Patient reports complete resolution of her pain. She is no longer taking pain medication.   Past Medical History  Diagnosis Date  . HIV (human immunodeficiency virus infection)   . Trichomonas infection   . Pyelonephritis   . Recurrent boils   . Urinary frequency     and nocturia  . Hydronephrosis, left   . Cyst of ovary     PT STATES SHE IS TAKING ANTIBIOTICS FOR CYST ON OVARY   Past Surgical History  Procedure Laterality Date  . Cesarean section  2006  . Cystoscopy with retrograde pyelogram, ureteroscopy and stent placement Bilateral 10/28/2013    Procedure: CYSTOSCOPY WITH RETROGRADE PYELOGRAM, CYSTOGRAM;  Surgeon: Valetta Fulleravid S Grapey, MD;  Location: [facility];  Service: Urology;  Laterality: Bilateral;  . Laparoscopic unilateral salpingo oopherectomy Left 01/05/2014    Procedure: LAPAROSCOPIC LEFT SALPINGO OOPHORECTOMY;  Surgeon: Catalina AntiguaPeggy Quamaine Webb, MD;  Location: [facility];  Service: Gynecology;  Laterality: Left;   Family History  Problem Relation Age of Onset  . HIV Mother    History  Substance Use Topics  . Smoking status: Current Every Day Smoker -- 0.10 packs/day for 14 years    Types: Cigarettes, Cigars    Last Attempt to Quit: 06/13/2013  . Smokeless tobacco: Never Used     Comment: smoking 1-2 black and milds per day  . Alcohol Use: No     Comment: no longer smokes cigarettes - does smoke tobacco product called blackmilds   GENERAL: Well-developed, well-nourished female in no acute distress.  ABDOMEN: Soft, nontender, nondistended. No organomegaly. Incision: healed x 3. No erythema, induration or drainage EXTREMITIES: No cyanosis, clubbing, or edema, 2+ distal pulses.  A/P 34 yo here for post op check - Patient is medically cleared to resume all activities of daily living - Patient sexually active with  same partner using condoms, interested in using different form- Discussed contraception options and patient will decided at next visit - RTC prn with us or PCP for annual exam

## 2014-01-21 NOTE — Patient Instructions (Signed)
Contraception Choices Contraception (birth control) is the use of any methods or devices to prevent pregnancy. Below are some methods to help avoid pregnancy. HORMONAL METHODS   Contraceptive implant. This is a thin, plastic tube containing progesterone hormone. It does not contain estrogen hormone. Your health care provider inserts the tube in the inner part of the upper arm. The tube can remain in place for up to 3 years. After 3 years, the implant must be removed. The implant prevents the ovaries from releasing an egg (ovulation), thickens the cervical mucus to prevent sperm from entering the uterus, and thins the lining of the inside of the uterus.  Progesterone-only injections. These injections are given every 3 months by your health care provider to prevent pregnancy. This synthetic progesterone hormone stops the ovaries from releasing eggs. It also thickens cervical mucus and changes the uterine lining. This makes it harder for sperm to survive in the uterus.  Birth control pills. These pills contain estrogen and progesterone hormone. They work by preventing the ovaries from releasing eggs (ovulation). They also cause the cervical mucus to thicken, preventing the sperm from entering the uterus. Birth control pills are prescribed by a health care provider.Birth control pills can also be used to treat heavy periods.  Minipill. This type of birth control pill contains only the progesterone hormone. They are taken every day of each month and must be prescribed by your health care provider.  Birth control patch. The patch contains hormones similar to those in birth control pills. It must be changed once a week and is prescribed by a health care provider.  Vaginal ring. The ring contains hormones similar to those in birth control pills. It is left in the vagina for 3 weeks, removed for 1 week, and then a new one is put back in place. The patient must be comfortable inserting and removing the ring  from the vagina.A health care provider's prescription is necessary.  Emergency contraception. Emergency contraceptives prevent pregnancy after unprotected sexual intercourse. This pill can be taken right after sex or up to 5 days after unprotected sex. It is most effective the sooner you take the pills after having sexual intercourse. Most emergency contraceptive pills are available without a prescription. Check with your pharmacist. Do not use emergency contraception as your only form of birth control. BARRIER METHODS   Female condom. This is a thin sheath (latex or rubber) that is worn over the penis during sexual intercourse. It can be used with spermicide to increase effectiveness.  Female condom. This is a soft, loose-fitting sheath that is put into the vagina before sexual intercourse.  Diaphragm. This is a soft, latex, dome-shaped barrier that must be fitted by a health care provider. It is inserted into the vagina, along with a spermicidal jelly. It is inserted before intercourse. The diaphragm should be left in the vagina for 6 to 8 hours after intercourse.  Cervical cap. This is a round, soft, latex or plastic cup that fits over the cervix and must be fitted by a health care provider. The cap can be left in place for up to 48 hours after intercourse.  Sponge. This is a soft, circular piece of polyurethane foam. The sponge has spermicide in it. It is inserted into the vagina after wetting it and before sexual intercourse.  Spermicides. These are chemicals that kill or block sperm from entering the cervix and uterus. They come in the form of creams, jellies, suppositories, foam, or tablets. They do not require a   prescription. They are inserted into the vagina with an applicator before having sexual intercourse. The process must be repeated every time you have sexual intercourse. INTRAUTERINE CONTRACEPTION  Intrauterine device (IUD). This is a T-shaped device that is put in a woman's uterus  during a menstrual period to prevent pregnancy. There are 2 types:  Copper IUD. This type of IUD is wrapped in copper wire and is placed inside the uterus. Copper makes the uterus and fallopian tubes produce a fluid that kills sperm. It can stay in place for 10 years.  Hormone IUD. This type of IUD contains the hormone progestin (synthetic progesterone). The hormone thickens the cervical mucus and prevents sperm from entering the uterus, and it also thins the uterine lining to prevent implantation of a fertilized egg. The hormone can weaken or kill the sperm that get into the uterus. It can stay in place for 3-5 years, depending on which type of IUD is used. PERMANENT METHODS OF CONTRACEPTION  Female tubal ligation. This is when the woman's fallopian tubes are surgically sealed, tied, or blocked to prevent the egg from traveling to the uterus.  Hysteroscopic sterilization. This involves placing a small coil or insert into each fallopian tube. Your doctor uses a technique called hysteroscopy to do the procedure. The device causes scar tissue to form. This results in permanent blockage of the fallopian tubes, so the sperm cannot fertilize the egg. It takes about 3 months after the procedure for the tubes to become blocked. You must use another form of birth control for these 3 months.  Female sterilization. This is when the female has the tubes that carry sperm tied off (vasectomy).This blocks sperm from entering the vagina during sexual intercourse. After the procedure, the man can still ejaculate fluid (semen). NATURAL PLANNING METHODS  Natural family planning. This is not having sexual intercourse or using a barrier method (condom, diaphragm, cervical cap) on days the woman could become pregnant.  Calendar method. This is keeping track of the length of each menstrual cycle and identifying when you are fertile.  Ovulation method. This is avoiding sexual intercourse during ovulation.  Symptothermal  method. This is avoiding sexual intercourse during ovulation, using a thermometer and ovulation symptoms.  Post-ovulation method. This is timing sexual intercourse after you have ovulated. Regardless of which type or method of contraception you choose, it is important that you use condoms to protect against the transmission of sexually transmitted infections (STIs). Talk with your health care provider about which form of contraception is most appropriate for you. Document Released: 12/18/2004 Document Revised: 12/23/2012 Document Reviewed: 06/12/2012 Exit[employer] Patient Information 2015 [employer]. This information is not intended to replace advice given to you by your health care provider. Make sure you discuss any questions you have with your health care provider.  

## 2014-03-24 ENCOUNTER — Other Ambulatory Visit: Payer: Medicaid Other

## 2014-04-07 ENCOUNTER — Ambulatory Visit: Payer: Medicaid Other | Admitting: Internal Medicine

## 2014-04-28 ENCOUNTER — Other Ambulatory Visit: Payer: Medicaid Other

## 2014-04-28 DIAGNOSIS — B20 Human immunodeficiency virus [HIV] disease: Secondary | ICD-10-CM

## 2014-04-29 LAB — T-HELPER CELL (CD4) - (RCID CLINIC ONLY)
CD4 % Helper T Cell: 12 % — ABNORMAL LOW (ref 33–55)
CD4 T Cell Abs: 140 /uL — ABNORMAL LOW (ref 400–2700)

## 2014-04-29 LAB — HIV-1 RNA QUANT-NO REFLEX-BLD
HIV 1 RNA QUANT: 27 {copies}/mL — AB (ref <20)
HIV-1 RNA Quant, Log: 1.43 {Log} — ABNORMAL HIGH (ref <1.30)

## 2014-06-02 ENCOUNTER — Encounter: Payer: Self-pay | Admitting: Internal Medicine

## 2014-06-02 ENCOUNTER — Ambulatory Visit (INDEPENDENT_AMBULATORY_CARE_PROVIDER_SITE_OTHER): Payer: Medicaid Other | Admitting: Internal Medicine

## 2014-06-02 VITALS — BP 101/69 | HR 94 | Temp 98.5°F | Ht 61.0 in | Wt 147.0 lb

## 2014-06-02 DIAGNOSIS — K0510 Chronic gingivitis, plaque induced: Secondary | ICD-10-CM

## 2014-06-02 DIAGNOSIS — Z21 Asymptomatic human immunodeficiency virus [HIV] infection status: Secondary | ICD-10-CM | POA: Diagnosis present

## 2014-06-02 DIAGNOSIS — B20 Human immunodeficiency virus [HIV] disease: Secondary | ICD-10-CM

## 2014-06-02 MED ORDER — AMOXICILLIN 400 MG/5ML PO SUSR: 480.0000 mg | Freq: Three times a day (TID) | ORAL | Status: DC

## 2014-06-02 NOTE — Progress Notes (Signed)
   Subjective:    Patient ID: Volney PresserSharon D Whitenight, female    DOB: 11/24/1980, 34 y.o.   MRN: [id number]  HPI She is here for follow-up of HIV.Marland Kitchen.  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  Was taking intermittently though says she kept getting refills from ED by her report.  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 Initial CD4 of 90, vl [id number].  Has had difficulty swallowing pills and started on rilpivirine, tivicay which she is able to swallow and lamivudine with abacavir solution. I dropped the abacavir since she otherwise has 3 active drugs.  Her CD4 remains under 200 but viral load remains nearly undetectable. She unfortunately ran out of her medications for about 1 month and was back on at the time of her last labs, April 27th.  Continues to take them now.  Also complaint of 'gingivitis'.  Has had bleeding gums, tooth decay, receeding gums.  Afraid of dentist.  Can only eat soft food.     Review of Systems  Constitutional: Negative for fever, fatigue and unexpected weight change.  HENT: Negative for trouble swallowing.        Gum pain and bleeding  Gastrointestinal: Negative for nausea and diarrhea.  Skin: Negative for rash.  Neurological: Negative for dizziness and light-headedness.  Psychiatric/Behavioral: Negative for sleep disturbance.       Objective:   Physical Exam  Constitutional: She appears well-developed and well-nourished. No distress.  HENT:  Mouth/Throat: No oropharyngeal exudate.  Very poor dentition with receeding gums, dark areas of teeth at gumline, bleeding, open areas  Eyes: No scleral icterus.  Cardiovascular: Normal rate, regular rhythm and normal heart sounds.   No murmur heard. Pulmonary/Chest: Effort normal and breath sounds normal. No respiratory distress.  Lymphadenopathy:    She has no cervical adenopathy.  Skin: No rash noted.          Assessment & Plan:

## 2014-06-02 NOTE — Assessment & Plan Note (Signed)
Will check again today and if ok, rtc 3 months.

## 2014-06-02 NOTE — Assessment & Plan Note (Signed)
Will try amoxicillin and dental appt made.

## 2014-06-04 LAB — T-HELPER CELL (CD4) - (RCID CLINIC ONLY)
CD4 T CELL HELPER: 15 % — AB (ref 33–55)
CD4 T Cell Abs: 180 /uL — ABNORMAL LOW (ref 400–2700)

## 2014-06-04 LAB — HIV-1 RNA QUANT-NO REFLEX-BLD
HIV 1 RNA Quant: 230 copies/mL — ABNORMAL HIGH (ref <20)
HIV-1 RNA Quant, Log: 2.36 {Log} — ABNORMAL HIGH (ref <1.30)

## 2014-07-07 ENCOUNTER — Encounter: Payer: Self-pay | Admitting: Family Medicine

## 2014-07-07 ENCOUNTER — Ambulatory Visit (INDEPENDENT_AMBULATORY_CARE_PROVIDER_SITE_OTHER): Payer: Medicaid Other | Admitting: Family Medicine

## 2014-07-07 VITALS — BP 104/75 | HR 91 | Temp 97.7°F | Resp 16 | Ht 61.0 in | Wt 150.0 lb

## 2014-07-07 DIAGNOSIS — N946 Dysmenorrhea, unspecified: Secondary | ICD-10-CM | POA: Diagnosis not present

## 2014-07-07 DIAGNOSIS — G4730 Sleep apnea, unspecified: Secondary | ICD-10-CM

## 2014-07-07 DIAGNOSIS — Z0000 Encounter for general adult medical examination without abnormal findings: Secondary | ICD-10-CM

## 2014-07-07 LAB — LIPID PANEL
Cholesterol: 117 mg/dL (ref 0–200)
HDL: 33 mg/dL — ABNORMAL LOW (ref >=46)
LDL CALC: 69 mg/dL (ref 0–99)
TRIGLYCERIDES: 75 mg/dL (ref <150)
Total CHOL/HDL Ratio: 3.5 Ratio
VLDL: 15 mg/dL (ref 0–40)

## 2014-07-07 MED ORDER — TRAMADOL HCL 50 MG PO TABS: 50.0000 mg | ORAL_TABLET | Freq: Three times a day (TID) | ORAL | Status: DC | PRN

## 2014-07-07 NOTE — Patient Instructions (Addendum)
Tramadol is a very mild narcotic.  Only use for the menstural pain. Continue your care with Infectious Disease. We will arrange a referral to GYN. Try to give up those last couple of cigarettes.

## 2014-07-07 NOTE — Progress Notes (Signed)
Patient ID: Kara Allen, female   DOB: 07/27/1980, 34 y.o.   MRN: [id number]   Kara Allen, is a 34 y.o. female  [id number]  [id number]  DOB - 06/28/1980  CC:  Chief Complaint  Patient presents with  . Establish Care    extreamley painful menstral cycles        HPI: Kara Allen is a 34 y.o. female here today to establish medical care. Her major complaint today is painful menstural periods. She had a left oophrectomy and fallopian tube remove due to an ovarian cyst in December. She went for a couple of months without painful periods but that has returned. She is HIV positive since birth and is followed by Dr[facility]xe[facility]. She is on Tivicay 50 at bedtime, Epivir 30 mg daily and Edurant 25 mg. She has a history of pyelonephrtis, hydronephrosis, and vesciculo-uretheral  reflux. She request a referral to GYN.  Allergies  Allergen Reactions  . Advil [Ibuprofen] Rash and Other (See Comments)    "Irritates my bladder"  . Latex Other (See Comments)    If pt wears latex gloves or any contact with latex causes itching and rash. Some tapes also cause itching, skin irritation - paper tape seems to be okay  . Sulfamethoxazole-Trimethoprim Other (See Comments)    Rash, chills and hives  . Tylenol [Acetaminophen] Other (See Comments)    Makes me pee more- irritates bladder   Past Medical History  Diagnosis Date  . HIV (human immunodeficiency virus infection)   . Trichomonas infection   . Pyelonephritis   . Recurrent boils   . Urinary frequency     and nocturia  . Hydronephrosis, left   . Cyst of ovary     PT STATES SHE IS TAKING ANTIBIOTICS FOR CYST ON OVARY   Current Outpatient Prescriptions on File Prior to Visit  Medication Sig Dispense Refill  . dolutegravir (TIVICAY) 50 MG tablet Take 50 mg by mouth at bedtime.    Marland Kitchen. lamiVUDine (EPIVIR) 10 MG/ML solution Take 30 mg by mouth at bedtime.     . rilpivirine (EDURANT) 25 MG TABS tablet Take 25 mg by mouth at bedtime.     Marland Kitchen. amoxicillin (AMOXIL) 400 MG/5ML suspension Take 6 mLs (480 mg total) by mouth 3 (three) times daily. (Patient not taking: Reported on 07/07/2014) 200 mL 1   No current facility-administered medications on file prior to visit.   Family History  Problem Relation Age of Onset  . HIV Mother    History   Social History  . Marital Status: Single    Spouse Name: N/A  . Number of Children: N/A  . Years of Education: N/A   Occupational History  . Not on file.   Social History Main Topics  . Smoking status: Current Every Day Smoker -- 0.10 packs/day for 14 years    Types: Cigarettes, Cigars  . Smokeless tobacco: Never Used     Comment: smoking 1-2 black and milds per day  . Alcohol Use: No     Comment:  - does smoke tobacco product called blackmilds  . Drug Use: 5.00 per week    Special: Marijuana     Comment: 2-3 joints/daily- 12/30/13- pt advised not to smoke marijuana night before or am of surgery  . Sexual Activity:    Partners: Male    Birth Control/ Protection: Condom   Other Topics Concern  . Not on file   Social History Narrative    Review of Systems: Constitutional: Negative  for fever, chills, appetite change, weight loss,  fatigue. HENT: Negative for ear pain, ear discharge.nose bleeds.  Mouth:  Positive for gum and dental pain, Is afraid of dentist. Eyes: Negative for pain, discharge, redness. Positive for occassional  itching and for decreased near vision Neck: Negative for pain, stiffness Respiratory: Negative for cough. Positive for some shortness of breath and possible pauses in breathing at night. Cardiovascular: Negative for chest pain, palpitations and leg swelling. Gastrointestinal: Negative for abdominal distention, abdominal pain, nausea, vomiting, diarrhea, constipations Genitourinary: Negative for dysuria. Positive for  urgency, frequency,  Musculoskeletal: Negative for joint pain, joint  swelling, arthralgia and gait problem.Negative for weakness.  Positive for low back pain Neurological: Negative for dizziness, tremors, seizures, syncope,   light-headedness, numbness and headaches.  Hematological: Negative for easy bruising or bleeding Psychiatric/Behavioral: Negative for depression, anxiety, decreased concentration, confusion GYN:  Positive for painful menstural periods.   Objective:   Filed Vitals:   07/07/14 1109  BP: 104/75  Pulse: 91  Temp: 97.7 F (36.5 C)  Resp: 16    Physical Exam: Constitutional: Patient appears well-developed and well-nourished. No distress. HENT: Normocephalic, atraumatic, External right and left ear normal. Oropharynx is clear and moist.  Eyes: Conjunctivae and EOM are normal. PERRLA, no scleral icterus. Mouth:  Very poor dentition with receding gums. Neck: Normal ROM. Neck supple.  No thyromegaly.There are two    Small palpable lymph node in the neft neck. CVS: RRR, S1/S2 +, no murmurs, no gallops, no rubs Pulmonary: Effort and breath sounds normal, no stridor, rhonchi, wheezes, rales.  Abdominal: Soft. Normoactive BS,, no distension, rebound or guarding. There is generalized tenderness of the abdomen, nothing localized Musculoskeletal: Normal range of motion. No edema and no tenderness.  Neuro: Alert.Normal muscle tone coordination. Non-focal Skin: Skin is warm and dry. No rash noted. Not diaphoretic. No erythema. No pallor. Psychiatric: Normal mood and affect. Behavior, judgment, thought content normal.  Lab Results  Component Value Date   WBC 4.4 12/30/2013   HGB 11.8* 12/30/2013   HCT 35.1* 12/30/2013   MCV 90.5 12/30/2013   PLT 232 12/30/2013   Lab Results  Component Value Date   CREATININE 0.60 12/17/2013   BUN 12 12/17/2013   NA 135 12/17/2013   K 4.2 12/17/2013   CL 103 12/17/2013   CO2 25 12/17/2013    No results found for: HGBA1C Lipid Panel     Component Value Date/Time   CHOL 99 08/13/2013 0956   TRIG 121 08/13/2013 0956   HDL 31* 08/13/2013 0956   CHOLHDL 3.2  08/13/2013 0956   VLDL 24 08/13/2013 0956   LDLCALC 44 08/13/2013 0956       Assessment and plan:   Dysmenorrhea -As she is alleric to NSAIDS and acetamenophen/ Will prescribe Tramadol 50 mg. #30 to be used one every 8 hours prn for severe dysmenorrhea -Referral to GYN  HIV + -Follow-up with Dr. Luciana Axe as planned.  Tobacco use -Have encouraged her to give up those last couple of cigarettes.  Possible Sleep apnea -Referral for sleep study  The patient was given clear instructions to go to ER or return to medical center if symptoms don't improve, worsen or new problems develop. The patient verbalized understanding. The patient was told to call to get lab results if they haven't heard anything in the next week.         Henrietta Hoover, MSN, FNP-B 07/07/2014, 11:19 AM

## 2014-07-14 ENCOUNTER — Telehealth: Payer: Self-pay

## 2014-07-14 ENCOUNTER — Encounter: Payer: Self-pay | Admitting: Certified Nurse Midwife

## 2014-07-14 ENCOUNTER — Ambulatory Visit (INDEPENDENT_AMBULATORY_CARE_PROVIDER_SITE_OTHER): Payer: Medicaid Other | Admitting: Certified Nurse Midwife

## 2014-07-14 ENCOUNTER — Other Ambulatory Visit: Payer: Self-pay | Admitting: Certified Nurse Midwife

## 2014-07-14 VITALS — BP 102/75 | HR 88 | Temp 98.2°F | Ht 61.0 in | Wt 148.0 lb

## 2014-07-14 DIAGNOSIS — K5900 Constipation, unspecified: Secondary | ICD-10-CM | POA: Diagnosis not present

## 2014-07-14 DIAGNOSIS — Z01419 Encounter for gynecological examination (general) (routine) without abnormal findings: Secondary | ICD-10-CM | POA: Diagnosis not present

## 2014-07-14 DIAGNOSIS — N939 Abnormal uterine and vaginal bleeding, unspecified: Secondary | ICD-10-CM | POA: Diagnosis not present

## 2014-07-14 DIAGNOSIS — Z0000 Encounter for general adult medical examination without abnormal findings: Secondary | ICD-10-CM

## 2014-07-14 DIAGNOSIS — Z124 Encounter for screening for malignant neoplasm of cervix: Secondary | ICD-10-CM

## 2014-07-14 DIAGNOSIS — N946 Dysmenorrhea, unspecified: Secondary | ICD-10-CM | POA: Diagnosis not present

## 2014-07-14 DIAGNOSIS — R1032 Left lower quadrant pain: Secondary | ICD-10-CM | POA: Diagnosis not present

## 2014-07-14 LAB — POCT URINALYSIS DIPSTICK
BILIRUBIN UA: NEGATIVE
GLUCOSE UA: NEGATIVE
KETONES UA: NEGATIVE
NITRITE UA: NEGATIVE
PH UA: 6
RBC UA: 250
Spec Grav, UA: 1.015
UROBILINOGEN UA: NEGATIVE

## 2014-07-14 NOTE — Progress Notes (Signed)
Patient ID: Kara Allen, female   DOB: [birth year], 34 y.o.   MRN: [id number]    Subjective:     Kara Allen is a 34 y.o. female here for a routine exam.  Current complaints: dysmenorrhea.  Had left oophrec/salpinectomy in January 2016.  Is having N&V with her cycle.  Monthly cycles lasting 7 days, the week prior is having constant pain.  When on cycle is having clots about quarter sized, with heavy flow, saturating super maxi in about 3-4 hours.  Has changed her diet, eliminated sugars/sodas, eating lighter.  Desires to quit smoking.  Smokes about 3 cigs/day.    Thinking about pregnancy in the next few years.  Has a 49 year old son.    Personal health questionnaire:  Is patient Ashkenazi Jewish, have a family history of breast and/or ovarian cancer: no Is there a family history of uterine cancer diagnosed at age < 22, gastrointestinal cancer, urinary tract cancer, family member who is a Personnel officer syndrome-associated carrier: no Is the patient overweight and hypertensive, family history of diabetes, personal history of gestational diabetes, preeclampsia or PCOS: no Is patient over 30, have PCOS,  family history of premature CHD under age 59, diabetes, smoke, have hypertension or peripheral artery disease:  Yes, greatgrandmothers maternal side: CVA, DM At any time, has a partner hit, kicked or otherwise hurt or frightened you?: no Over the past 2 weeks, have you felt down, depressed or hopeless?: no Over the past 2 weeks, have you felt little interest or pleasure in doing things?:no   Gynecologic History Patient's last menstrual period was 06/27/2014. Contraception: condoms Last Pap: unknown. Results were: normal according to the patient Last mammogram: N/A.   Obstetric History OB History  Gravida Para Term Preterm AB SAB TAB Ectopic Multiple Living  2 1 1  1 1    1     # Outcome Date GA Lbr Len/2nd Weight Sex Delivery Anes PTL Lv  2 Term           1 SAB               Past Medical  History  Diagnosis Date  . HIV (human immunodeficiency virus infection)   . Trichomonas infection   . Pyelonephritis   . Recurrent boils   . Urinary frequency     and nocturia  . Hydronephrosis, left   . Cyst of ovary     PT STATES SHE IS TAKING ANTIBIOTICS FOR CYST ON OVARY    Past Surgical History  Procedure Laterality Date  . Cesarean section  2006  . Cystoscopy with retrograde pyelogram, ureteroscopy and stent placement Bilateral 10/28/2013    Procedure: CYSTOSCOPY WITH RETROGRADE PYELOGRAM, CYSTOGRAM;  Surgeon: Valetta Fuller, MD;  Location: [facility];  Service: Urology;  Laterality: Bilateral;  . Laparoscopic unilateral salpingo oopherectomy Left 01/05/2014    Procedure: LAPAROSCOPIC LEFT SALPINGO OOPHORECTOMY;  Surgeon: Catalina Antigua, MD;  Location: [facility];  Service: Gynecology;  Laterality: Left;     Current outpatient prescriptions:  .  dolutegravir (TIVICAY) 50 MG tablet, Take 50 mg by mouth at bedtime., Disp: , Rfl:  .  lamiVUDine (EPIVIR) 10 MG/ML solution, Take 30 mg by mouth at bedtime. , Disp: , Rfl:  .  rilpivirine (EDURANT) 25 MG TABS tablet, Take 25 mg by mouth at bedtime., Disp: , Rfl:  .  amoxicillin (AMOXIL) 400 MG/5ML suspension, Take 6 mLs (480 mg total) by mouth 3 (three) times daily. (Patient not taking: Reported on 07/07/2014), Disp: 200  mL, Rfl: 1 .  traMADol (ULTRAM) 50 MG tablet, Take 1 tablet (50 mg total) by mouth every 8 (eight) hours as needed for moderate pain. (Patient not taking: Reported on 07/14/2014), Disp: 30 tablet, Rfl: 0 Allergies  Allergen Reactions  . Advil [Ibuprofen] Rash and Other (See Comments)    "Irritates my bladder"  . Latex Other (See Comments)    If pt wears latex gloves or any contact with latex causes itching and rash. Some tapes also cause itching, skin irritation - paper tape seems to be okay  . Sulfamethoxazole-Trimethoprim Other (See Comments)    Rash, chills and hives  . Tylenol [Acetaminophen] Other (See Comments)    Makes me  pee more- irritates bladder    History  Substance Use Topics  . Smoking status: Current Every Day Smoker -- 0.10 packs/day for 14 years    Types: Cigarettes, Cigars  . Smokeless tobacco: Never Used     Comment: smoking 1-2 black and milds per day  . Alcohol Use: No     Comment:  - does smoke tobacco product called blackmilds    Family History  Problem Relation Age of Onset  . HIV Mother       Review of Systems  Constitutional: negative for fatigue and weight loss Respiratory: negative for cough and wheezing Cardiovascular: negative for chest pain, fatigue and palpitations Gastrointestinal: negative for abdominal pain and change in bowel habits Musculoskeletal:negative for myalgias Neurological: negative for gait problems and tremors Behavioral/Psych: negative for abusive relationship, depression Endocrine: negative for temperature intolerance   Genitourinary:negative for abnormal menstrual periods, genital lesions, hot flashes, sexual problems and vaginal discharge Integument/breast: negative for breast lump, breast tenderness, nipple discharge and skin lesion(s)    Objective:       BP 102/75 mmHg  Pulse 88  Temp(Src) 98.2 F (36.8 C)  Ht  (1.549 m)  Wt 148 lb (67.132 kg)  BMI 27.98 kg/m2  LMP 06/27/2014 General:   alert  Skin:   no rash or abnormalities  Lungs:   clear to auscultation bilaterally  Heart:   regular rate and rhythm, S1, S2 normal, no murmur, click, rub or gallop  Breasts:   normal without suspicious masses, skin or nipple changes or axillary nodes  Abdomen:  normal findings: no organomegaly, soft, non-tender and no hernia  Pelvis:  External genitalia: normal general appearance Urinary system: urethral meatus normal and bladder without fullness, nontender Vaginal: normal without tenderness, induration or masses Cervix: normal appearance Adnexa: normal bimanual exam Uterus: anteverted and non-tender, normal size   Lab Review Urine pregnancy  test Labs reviewed yes Radiologic studies reviewed no  50% of 30 min visit spent on counseling and coordination of care.   Assessment:    Healthy female exam.   Chronic constipation Contraception counseling AUB, past hx of ovarian cyst Smoking cessation counseling   Plan:    Education reviewed: depression evaluation, low fat, low cholesterol diet, safe sex/STD prevention, self breast exams, skin cancer screening, smoking cessation and weight bearing exercise. Contraception: undecided, patches versus Mirena.  Information given.  Patient to call infectious disease and check to see if her medications are compatable with the patch.. Follow up in: in one year for annual exam or with next period for mirena IUD .Marland Kitchen   No orders of the defined types were placed in this encounter.   Orders Placed This Encounter  Procedures  . SureSwab, Vaginosis/Vaginitis Plus  . Urine culture  . US Pelvis Complete    Standing  Status: Future     Number of Occurrences:      Standing Expiration Date: 09/14/2015    Order Specific Question:  Reason for Exam (SYMPTOM  OR DIAGNOSIS REQUIRED)    Answer:  AUB, dysmenorrhea    Order Specific Question:  [facility]?    [facility]  . Ambulatory referral to Gastroenterology    Referral Priority:  Routine    Referral Type:  Consultation    Referral Reason:  Specialty Services Required    Number of Visits Requested:  1  . POCT urinalysis dipstick

## 2014-07-14 NOTE — Telephone Encounter (Signed)
[facility] ALSO SAID THEY NEEDED ORDER FOR TRANSVAGINAL NON-OB AS WELL - GUESS THEY HAVE TO HAVE BOTH!

## 2014-07-15 LAB — URINE CULTURE
Colony Count: NO GROWTH
ORGANISM ID, BACTERIA: NO GROWTH

## 2014-07-16 ENCOUNTER — Ambulatory Visit: Payer: Medicaid Other | Admitting: Certified Nurse Midwife

## 2014-07-16 LAB — PAP IG AND HPV HIGH-RISK: HPV DNA HIGH RISK: DETECTED — AB

## 2014-07-18 LAB — SURESWAB, VAGINOSIS/VAGINITIS PLUS
Atopobium vaginae: 6.7 Log (cells/mL)
C. GLABRATA, DNA: NOT DETECTED
C. TROPICALIS, DNA: NOT DETECTED
C. albicans, DNA: NOT DETECTED
C. parapsilosis, DNA: NOT DETECTED
C. trachomatis RNA, TMA: NOT DETECTED
GARDNERELLA VAGINALIS: 7.7 Log (cells/mL)
LACTOBACILLUS SPECIES: NOT DETECTED Log (cells/mL)
MEGASPHAERA SPECIES: >8 Log (cells/mL)
N. gonorrhoeae RNA, TMA: NOT DETECTED
T. VAGINALIS RNA, QL TMA: DETECTED — AB

## 2014-07-20 ENCOUNTER — Other Ambulatory Visit: Payer: Self-pay | Admitting: Certified Nurse Midwife

## 2014-07-20 DIAGNOSIS — A599 Trichomoniasis, unspecified: Secondary | ICD-10-CM

## 2014-07-20 DIAGNOSIS — B9689 Other specified bacterial agents as the cause of diseases classified elsewhere: Secondary | ICD-10-CM

## 2014-07-20 DIAGNOSIS — B3731 Acute candidiasis of vulva and vagina: Secondary | ICD-10-CM

## 2014-07-20 DIAGNOSIS — N760 Acute vaginitis: Secondary | ICD-10-CM

## 2014-07-20 DIAGNOSIS — B373 Candidiasis of vulva and vagina: Secondary | ICD-10-CM

## 2014-07-20 MED ORDER — FLUCONAZOLE 100 MG PO TABS: 100.0000 mg | ORAL_TABLET | Freq: Once | ORAL | Status: DC

## 2014-07-20 MED ORDER — TINIDAZOLE 500 MG PO TABS: 2.0000 g | ORAL_TABLET | Freq: Every day | ORAL | Status: AC

## 2014-07-21 ENCOUNTER — Telehealth: Payer: Self-pay | Admitting: *Deleted

## 2014-07-21 NOTE — Telephone Encounter (Addendum)
Patient states she is not interested in the Mirena IUD which was discussed at her appointment. Patient states she is checking with the other physician she sees to find out if she can use the Ortho Evra patch with the other medications she is taking. Patient instructed to contact the office once she finds out that information and we can send in a prescription. Reviewed lab results with patient and appointment scheduled for colposcopy.

## 2014-07-22 ENCOUNTER — Ambulatory Visit (HOSPITAL_COMMUNITY)
Admission: RE | Admit: 2014-07-22 | Discharge: 2014-07-22 | Disposition: A | Discharge status: Home / Self Care | Payer: Medicaid Other | Source: Ambulatory Visit | Attending: Certified Nurse Midwife | Admitting: Certified Nurse Midwife

## 2014-07-22 ENCOUNTER — Other Ambulatory Visit: Payer: Self-pay | Admitting: Certified Nurse Midwife

## 2014-07-22 DIAGNOSIS — N946 Dysmenorrhea, unspecified: Secondary | ICD-10-CM | POA: Diagnosis not present

## 2014-07-22 DIAGNOSIS — N939 Abnormal uterine and vaginal bleeding, unspecified: Secondary | ICD-10-CM | POA: Diagnosis not present

## 2014-07-22 DIAGNOSIS — N83201 Unspecified ovarian cyst, right side: Secondary | ICD-10-CM

## 2014-07-27 ENCOUNTER — Other Ambulatory Visit: Payer: Self-pay | Admitting: Certified Nurse Midwife

## 2014-08-04 ENCOUNTER — Other Ambulatory Visit: Payer: Self-pay | Admitting: Obstetrics

## 2014-08-04 ENCOUNTER — Encounter: Payer: Self-pay | Admitting: Obstetrics

## 2014-08-04 ENCOUNTER — Ambulatory Visit (INDEPENDENT_AMBULATORY_CARE_PROVIDER_SITE_OTHER): Payer: Medicaid Other | Admitting: Obstetrics

## 2014-08-04 VITALS — BP 105/72 | HR 103 | Temp 98.1°F | Ht 61.0 in | Wt 150.8 lb

## 2014-08-04 DIAGNOSIS — R87612 Low grade squamous intraepithelial lesion on cytologic smear of cervix (LGSIL): Secondary | ICD-10-CM | POA: Diagnosis not present

## 2014-08-04 DIAGNOSIS — Z01818 Encounter for other preprocedural examination: Secondary | ICD-10-CM

## 2014-08-04 LAB — POCT URINE PREGNANCY: Preg Test, Ur: NEGATIVE

## 2014-08-04 NOTE — Addendum Note (Signed)
Addended by: Clearnce Hasten on: 08/04/2014 12:25 PM   Modules accepted: Orders

## 2014-08-04 NOTE — Progress Notes (Signed)
Colposcopy Procedure Note  Indications: Pap smear 1 months ago showed: low-grade squamous intraepithelial neoplasia (LGSIL - encompassing HPV,mild dysplasia,CIN I). The prior pap showed no abnormalities.  Prior cervical/vaginal disease: normal exam without visible pathology. Prior cervical treatment: no treatment.  Procedure Details  The risks and benefits of the procedure and Written informed consent obtained.  A time-out was performed confirming the patient, procedure and allergy status  Speculum placed in vagina and excellent visualization of cervix achieved, cervix swabbed x 3 with acetic acid solution.  Findings: Cervix: no visible lesions, no mosaicism, no punctation and no abnormal vasculature; SCJ visualized 360 degrees without lesions, endocervical curettage performed, cervical biopsies taken at 6, 10 and 1 o'clock, specimen labelled and sent to pathology and hemostasis achieved with silver nitrate.   Vaginal inspection: normal without visible lesions. Vulvar colposcopy: vulvar colposcopy not performed.   Physical Exam   Specimens: ECC and cervical biopsies  Complications: none.  Plan: Specimens labelled and sent to Pathology. Will base further treatment on Pathology findings. Post biopsy instructions given to patient. Return to discuss Pathology results in 2 weeks.

## 2014-08-04 NOTE — Addendum Note (Signed)
Addended by: Henriette Combs on: 08/04/2014 04:06 PM   Modules accepted: Orders

## 2014-08-07 ENCOUNTER — Other Ambulatory Visit: Payer: Self-pay | Admitting: Obstetrics

## 2014-08-07 DIAGNOSIS — B9689 Other specified bacterial agents as the cause of diseases classified elsewhere: Secondary | ICD-10-CM

## 2014-08-07 DIAGNOSIS — N760 Acute vaginitis: Principal | ICD-10-CM

## 2014-08-07 LAB — SURESWAB, VAGINOSIS/VAGINITIS PLUS
Atopobium vaginae: 6.9 Log (cells/mL)
BV CATEGORY: UNDETERMINED — AB
C. ALBICANS, DNA: NOT DETECTED
C. GLABRATA, DNA: NOT DETECTED
C. TROPICALIS, DNA: NOT DETECTED
C. parapsilosis, DNA: NOT DETECTED
C. trachomatis RNA, TMA: NOT DETECTED
Gardnerella vaginalis: >8 Log (cells/mL)
LACTOBACILLUS SPECIES: DETECTED Log (cells/mL)
MEGASPHAERA SPECIES: >8 Log (cells/mL)
N. GONORRHOEAE RNA, TMA: NOT DETECTED
T. VAGINALIS RNA, QL TMA: NOT DETECTED

## 2014-08-07 MED ORDER — METRONIDAZOLE 500 MG PO TABS: 500.0000 mg | ORAL_TABLET | Freq: Two times a day (BID) | ORAL | Status: DC

## 2014-08-13 ENCOUNTER — Encounter: Payer: Self-pay | Admitting: Internal Medicine

## 2014-08-19 ENCOUNTER — Ambulatory Visit: Payer: Medicaid Other | Admitting: Obstetrics

## 2014-08-24 ENCOUNTER — Encounter (HOSPITAL_BASED_OUTPATIENT_CLINIC_OR_DEPARTMENT_OTHER): Payer: Medicaid Other

## 2014-08-27 ENCOUNTER — Ambulatory Visit (HOSPITAL_BASED_OUTPATIENT_CLINIC_OR_DEPARTMENT_OTHER): Payer: Medicaid Other | Attending: Family Medicine

## 2014-09-02 ENCOUNTER — Other Ambulatory Visit: Payer: Medicaid Other

## 2014-09-03 ENCOUNTER — Telehealth: Payer: Self-pay | Admitting: General Practice

## 2014-09-03 NOTE — Telephone Encounter (Signed)
Refill request for tramadol. LOV 07/07/2014. Please advise. Thanks!  

## 2014-09-08 ENCOUNTER — Other Ambulatory Visit: Payer: Self-pay | Admitting: Family Medicine

## 2014-09-08 ENCOUNTER — Ambulatory Visit (HOSPITAL_COMMUNITY): Payer: Medicaid Other

## 2014-09-08 MED ORDER — TRAMADOL HCL 50 MG PO TABS: 50.0000 mg | ORAL_TABLET | Freq: Three times a day (TID) | ORAL | Status: DC | PRN

## 2014-09-15 ENCOUNTER — Ambulatory Visit (HOSPITAL_COMMUNITY): Admission: RE | Admit: 2014-09-15 | Payer: Medicaid Other | Source: Ambulatory Visit

## 2014-09-16 ENCOUNTER — Encounter: Payer: Self-pay | Admitting: *Deleted

## 2014-09-16 ENCOUNTER — Ambulatory Visit: Payer: Medicaid Other | Admitting: Internal Medicine

## 2014-09-30 ENCOUNTER — Emergency Department (HOSPITAL_COMMUNITY)
Admission: EM | Admit: 2014-09-30 | Discharge: 2014-09-30 | Disposition: A | Discharge status: Home / Self Care | Payer: Medicaid Other | Attending: Physician Assistant | Admitting: Physician Assistant

## 2014-09-30 ENCOUNTER — Emergency Department (HOSPITAL_COMMUNITY): Payer: Medicaid Other

## 2014-09-30 ENCOUNTER — Telehealth: Payer: Self-pay | Admitting: Family Medicine

## 2014-09-30 ENCOUNTER — Encounter (HOSPITAL_COMMUNITY): Payer: Self-pay | Admitting: Emergency Medicine

## 2014-09-30 DIAGNOSIS — N943 Premenstrual tension syndrome: Secondary | ICD-10-CM

## 2014-09-30 DIAGNOSIS — N12 Tubulo-interstitial nephritis, not specified as acute or chronic: Secondary | ICD-10-CM | POA: Insufficient documentation

## 2014-09-30 DIAGNOSIS — A599 Trichomoniasis, unspecified: Secondary | ICD-10-CM | POA: Diagnosis not present

## 2014-09-30 DIAGNOSIS — Z8742 Personal history of other diseases of the female genital tract: Secondary | ICD-10-CM | POA: Insufficient documentation

## 2014-09-30 DIAGNOSIS — Z3202 Encounter for pregnancy test, result negative: Secondary | ICD-10-CM | POA: Diagnosis not present

## 2014-09-30 DIAGNOSIS — R1031 Right lower quadrant pain: Secondary | ICD-10-CM | POA: Diagnosis present

## 2014-09-30 DIAGNOSIS — R112 Nausea with vomiting, unspecified: Secondary | ICD-10-CM

## 2014-09-30 DIAGNOSIS — N390 Urinary tract infection, site not specified: Secondary | ICD-10-CM | POA: Diagnosis not present

## 2014-09-30 DIAGNOSIS — Z720 Tobacco use: Secondary | ICD-10-CM | POA: Insufficient documentation

## 2014-09-30 LAB — URINALYSIS, ROUTINE W REFLEX MICROSCOPIC
Bilirubin Urine: NEGATIVE
GLUCOSE, UA: NEGATIVE mg/dL
KETONES UR: NEGATIVE mg/dL
Nitrite: NEGATIVE
PROTEIN: 100 mg/dL — AB
Specific Gravity, Urine: 1.017 (ref 1.005–1.030)
UROBILINOGEN UA: 0.2 mg/dL (ref 0.0–1.0)
pH: 7 (ref 5.0–8.0)

## 2014-09-30 LAB — COMPREHENSIVE METABOLIC PANEL
ALK PHOS: 67 U/L (ref 38–126)
ALT: 16 U/L (ref 14–54)
ANION GAP: 8 (ref 5–15)
AST: 26 U/L (ref 15–41)
Albumin: 3 g/dL — ABNORMAL LOW (ref 3.5–5.0)
BUN: 8 mg/dL (ref 6–20)
CALCIUM: 9.3 mg/dL (ref 8.9–10.3)
CHLORIDE: 102 mmol/L (ref 101–111)
CO2: 23 mmol/L (ref 22–32)
CREATININE: 0.63 mg/dL (ref 0.44–1.00)
Glucose, Bld: 100 mg/dL — ABNORMAL HIGH (ref 65–99)
Potassium: 4.3 mmol/L (ref 3.5–5.1)
SODIUM: 133 mmol/L — AB (ref 135–145)
Total Bilirubin: 0.3 mg/dL (ref 0.3–1.2)
Total Protein: 9.7 g/dL — ABNORMAL HIGH (ref 6.5–8.1)

## 2014-09-30 LAB — URINE MICROSCOPIC-ADD ON

## 2014-09-30 LAB — CBC WITH DIFFERENTIAL/PLATELET
Basophils Absolute: 0 10*3/uL (ref 0.0–0.1)
Basophils Relative: 0 %
EOS ABS: 0 10*3/uL (ref 0.0–0.7)
EOS PCT: 0 %
HCT: 37.2 % (ref 36.0–46.0)
Hemoglobin: 12.3 g/dL (ref 12.0–15.0)
LYMPHS ABS: 0.7 10*3/uL (ref 0.7–4.0)
LYMPHS PCT: 12 %
MCH: 30.7 pg (ref 26.0–34.0)
MCHC: 33.1 g/dL (ref 30.0–36.0)
MCV: 92.8 fL (ref 78.0–100.0)
MONOS PCT: 5 %
Monocytes Absolute: 0.3 10*3/uL (ref 0.1–1.0)
Neutro Abs: 4.5 10*3/uL (ref 1.7–7.7)
Neutrophils Relative %: 83 %
PLATELETS: 249 10*3/uL (ref 150–400)
RBC: 4.01 MIL/uL (ref 3.87–5.11)
RDW: 14.7 % (ref 11.5–15.5)
WBC: 5.5 10*3/uL (ref 4.0–10.5)

## 2014-09-30 LAB — LIPASE, BLOOD: LIPASE: 23 U/L (ref 22–51)

## 2014-09-30 LAB — POC URINE PREG, ED
PREG TEST UR: NEGATIVE
PREG TEST UR: NEGATIVE

## 2014-09-30 MED ORDER — MORPHINE SULFATE (PF) 4 MG/ML IV SOLN
4.0000 mg | Freq: Once | INTRAVENOUS | Status: AC
  Administered 2014-09-30: 4 mg via INTRAVENOUS
  Filled 2014-09-30: qty 1

## 2014-09-30 MED ORDER — METRONIDAZOLE 500 MG PO TABS
2000.0000 mg | ORAL_TABLET | Freq: Once | ORAL | Status: AC
  Administered 2014-09-30: 2000 mg via ORAL
  Filled 2014-09-30: qty 4

## 2014-09-30 MED ORDER — TRAMADOL HCL 50 MG PO TABS: 50.0000 mg | ORAL_TABLET | Freq: Two times a day (BID) | ORAL | Status: DC | PRN

## 2014-09-30 MED ORDER — CEPHALEXIN 500 MG PO CAPS: ORAL_CAPSULE | ORAL | Status: DC

## 2014-09-30 MED ORDER — IOHEXOL 300 MG/ML  SOLN
100.0000 mL | Freq: Once | INTRAMUSCULAR | Status: AC | PRN
  Administered 2014-09-30: 100 mL via INTRAVENOUS

## 2014-09-30 MED ORDER — ONDANSETRON HCL 4 MG/2ML IJ SOLN
4.0000 mg | Freq: Once | INTRAMUSCULAR | Status: AC
  Administered 2014-09-30: 4 mg via INTRAVENOUS
  Filled 2014-09-30: qty 2

## 2014-09-30 MED ORDER — ONDANSETRON HCL 8 MG PO TABS: 8.0000 mg | ORAL_TABLET | Freq: Three times a day (TID) | ORAL | Status: DC | PRN

## 2014-09-30 MED ORDER — IOHEXOL 300 MG/ML  SOLN
25.0000 mL | Freq: Once | INTRAMUSCULAR | Status: AC | PRN
  Administered 2014-09-30: 25 mL via ORAL

## 2014-09-30 MED ORDER — DEXTROSE 5 % IV SOLN
1.0000 g | Freq: Once | INTRAVENOUS | Status: AC
  Administered 2014-09-30: 1 g via INTRAVENOUS
  Filled 2014-09-30: qty 10

## 2014-09-30 MED ORDER — SODIUM CHLORIDE 0.9 % IV BOLUS (SEPSIS)
1000.0000 mL | Freq: Once | INTRAVENOUS | Status: AC
  Administered 2014-09-30: 1000 mL via INTRAVENOUS

## 2014-09-30 NOTE — ED Provider Notes (Signed)
CSN: [id number]     Arrival date & time 09/30/14  1530 History   First MD Initiated Contact with Patient 09/30/14 1534     Chief Complaint  Patient presents with  . Abdominal Cramping     (Consider location/radiation/quality/duration/timing/severity/associated sxs/prior Treatment) HPI Comments: Kara Allen is a 34 y.o. female with a PMHx of HIV, pyelonephritis, frequent UTIs, L ovarian cysts s/p unilateral salpingo-oophorectomy, who presents to the ED with complaints of acute on chronic right lower quadrant abdominal pain. She states that since her oophorectomy she has had intermittent right lower quadrant pain that comes with her menstrual cycle, states that she normally takes tramadol with significant relief but she ran out 2 months ago. She reports that yesterday she developed 10/10 right lower quadrant crampy abdominal pain which is constant and waxing and waning in severity, radiating to her lower back, with no known aggravating factors, and with no treatments tried prior to arrival. Associated symptoms include nausea and multiple episodes (unsure of exact amount) of nonbloody nonbilious emesis. She denies any fevers, chills, chest pain, shortness breath, diarrhea, constipation, melena, hematochezia, hematemesis, obstipation, dysuria, hematuria, vaginal bleeding or discharge, numbness, tingling, or weakness. No recent travel, no sick contacts, no suspicious food intake, no alcohol or NSAID use. LMP was "1 month ago". She is unsure of the exact date.    Last CD4 count 180 on 06/02/14. Chart review reveals she just had a pelvic exam 1 month ago, and had a pelvic u/s on 7/21 which revealed a R ovarian cyst.  Patient is a 34 y.o. female presenting with cramps. The history is provided by the patient and medical records. No language interpreter was used.  Abdominal Cramping This is a recurrent problem. The current episode started yesterday. The problem occurs constantly. The problem has been  waxing and waning. Associated symptoms include abdominal pain, nausea and vomiting. Pertinent negatives include no arthralgias, chest pain, chills, fever, myalgias, numbness, urinary symptoms or weakness. Nothing aggravates the symptoms. She has tried oral narcotics (Tramadol in the past, ran out 2 months ago) for the symptoms. The treatment provided moderate relief.    Past Medical History  Diagnosis Date  . HIV (human immunodeficiency virus infection)   . Trichomonas infection   . Pyelonephritis   . Recurrent boils   . Urinary frequency     and nocturia  . Hydronephrosis, left   . Cyst of ovary     PT STATES SHE IS TAKING ANTIBIOTICS FOR CYST ON OVARY   Past Surgical History  Procedure Laterality Date  . Cesarean section  2006  . Cystoscopy with retrograde pyelogram, ureteroscopy and stent placement Bilateral 10/28/2013    Procedure: CYSTOSCOPY WITH RETROGRADE PYELOGRAM, CYSTOGRAM;  Surgeon: Valetta Fuller, MD;  Location: [facility];  Service: Urology;  Laterality: Bilateral;  . Laparoscopic unilateral salpingo oopherectomy Left 01/05/2014    Procedure: LAPAROSCOPIC LEFT SALPINGO OOPHORECTOMY;  Surgeon: Catalina Antigua, MD;  Location: [facility];  Service: Gynecology;  Laterality: Left;   Family History  Problem Relation Age of Onset  . HIV Mother    Social History  Substance Use Topics  . Smoking status: Current Every Day Smoker -- 0.10 packs/day for 14 years    Types: Cigarettes, Cigars  . Smokeless tobacco: Never Used     Comment: smoking 1-2 black and milds per day  . Alcohol Use: No     Comment:  - does smoke tobacco product called blackmilds   OB History    Gravida Para Term Preterm  AB TAB SAB Ectopic Multiple Living   Review of Systems  Constitutional: Negative for fever and chills.  Respiratory: Negative for shortness of breath.   Cardiovascular: Negative for chest pain.  Gastrointestinal: Positive for nausea, vomiting and abdominal pain. Negative for  diarrhea, constipation and blood in stool.  Genitourinary: Negative for dysuria, hematuria, vaginal bleeding and vaginal discharge.  Musculoskeletal: Negative for myalgias and arthralgias.  Skin: Negative for color change.  Allergic/Immunologic: Positive for immunocompromised state (HIV+).  Neurological: Negative for weakness and numbness.  Psychiatric/Behavioral: Negative for confusion.   10 Systems reviewed and are negative for acute change except as noted in the HPI.    Allergies  Advil; Latex; Sulfamethoxazole-trimethoprim; and Tylenol  Home Medications   Prior to Admission medications   Medication Sig Start Date End Date Taking? Authorizing Provider  amoxicillin (AMOXIL) 400 MG/5ML suspension Take 6 mLs (480 mg total) by mouth 3 (three) times daily. Patient not taking: Reported on 07/07/2014 06/02/14   Gardiner Barefoot, MD  dolutegravir (TIVICAY) 50 MG tablet Take 50 mg by mouth at bedtime.    Historical Provider, MD  fluconazole (DIFLUCAN) 100 MG tablet Take 1 tablet (100 mg total) by mouth once. Repeat dose in 48-72 hour. 07/20/14   Rachelle A Denney, CNM  lamiVUDine (EPIVIR) 10 MG/ML solution Take 30 mg by mouth at bedtime.     Historical Provider, MD  metroNIDAZOLE (FLAGYL) 500 MG tablet Take 1 tablet (500 mg total) by mouth 2 (two) times daily. 08/07/14   Brock Bad, MD  rilpivirine (EDURANT) 25 MG TABS tablet Take 25 mg by mouth at bedtime.    Historical Provider, MD  traMADol (ULTRAM) 50 MG tablet Take 1 tablet (50 mg total) by mouth every 8 (eight) hours as needed for moderate pain. 09/08/14   Henrietta Hoover, NP   BP 108/73 mmHg  Pulse 77  Temp(Src) 98.3 F (36.8 C) (Oral)  Resp 15  Ht  (1.549 m)  Wt 150 lb (68.04 kg)  BMI 28.36 kg/m2  SpO2 100% Physical Exam  Constitutional: She is oriented to person, place, and time. Vital signs are normal. She appears well-developed and well-nourished.  Non-toxic appearance. No distress.  Afebrile, nontoxic, NAD  HENT:    Head: Normocephalic and atraumatic.  Mouth/Throat: Oropharynx is clear and moist and mucous membranes are normal.  Eyes: Conjunctivae and EOM are normal. Right eye exhibits no discharge. Left eye exhibits no discharge.  Neck: Normal range of motion. Neck supple.  Cardiovascular: Normal rate, regular rhythm, normal heart sounds and intact distal pulses.  Exam reveals no gallop and no friction rub.   No murmur heard. Pulmonary/Chest: Effort normal and breath sounds normal. No respiratory distress. She has no decreased breath sounds. She has no wheezes. She has no rhonchi. She has no rales.  Abdominal: Soft. Normal appearance and bowel sounds are normal. She exhibits no distension. There is generalized tenderness. There is guarding (voluntary), CVA tenderness (mild R sided) and tenderness at McBurney's point. There is no rigidity, no rebound and negative Murphy's sign.    Soft, nondistended, +BS throughout, with some mild diffusely generalized abd TTP with focal tenderness in RLQ near mcburney's point, mild voluntary guarding, no rebound/rigidity, neg murphy's, mild R sided CVA TTP, neg psoas sign, neg foot tap test  Musculoskeletal: Normal range of motion.  Neurological: She is alert and oriented to person, place, and time. She has normal strength. No  sensory deficit.  Skin: Skin is warm, dry and intact. No rash noted.  Psychiatric: She has a normal mood and affect.  Nursing note and vitals reviewed.   ED Course  Procedures (including critical care time) Labs Review Labs Reviewed  COMPREHENSIVE METABOLIC PANEL - Abnormal; Notable for the following:    Sodium 133 (*)    Glucose, Bld 100 (*)    Total Protein 9.7 (*)    Albumin 3.0 (*)    All other components within normal limits  URINALYSIS, ROUTINE W REFLEX MICROSCOPIC (NOT AT [facility]) - Abnormal; Notable for the following:    APPearance TURBID (*)    Hgb urine dipstick SMALL (*)    Protein, ur 100 (*)    Leukocytes, UA LARGE (*)    All  other components within normal limits  URINE MICROSCOPIC-ADD ON - Abnormal; Notable for the following:    Squamous Epithelial / LPF FEW (*)    Bacteria, UA MANY (*)    All other components within normal limits  URINE CULTURE  CBC WITH DIFFERENTIAL/PLATELET  LIPASE, BLOOD  POC URINE PREG, ED  POC URINE PREG, ED  GC/CHLAMYDIA PROBE AMP ([place]) NOT AT [facility]    Imaging Review Ct Abdomen Pelvis W Contrast  09/30/2014   CLINICAL DATA:  Right lower quadrant abdomen pain starting today.  EXAM: CT ABDOMEN AND PELVIS WITH CONTRAST  TECHNIQUE: Multidetector CT imaging of the abdomen and pelvis was performed using the standard protocol following bolus administration of intravenous contrast.  CONTRAST:  OMNIPAQUE IOHEXOL 300 MG/ML  SOLN  COMPARISON:  September 30, 2013  FINDINGS: Calcified granulomas are identified within the liver. The liver is otherwise normal. The spleen, gallbladder, adrenal glands are normal. There is pancreatic duct dilatation unchanged compared to prior exam. There are dilated left renal calices with cortical thinning unchanged, chronic. There is also focal dilated right renal calyx with cortical thinning much less prominent compared to left, unchanged. Bilateral parapelvic renal cysts are identified unchanged. Mild dilatation of bilateral proximal ureters are noted without focal obstructing stone. The aorta is normal. There are enlarged retroperitoneal lymph nodes largest at the left kidney hilum measuring 1.7 x 1 cm, smaller compared to prior exam.  There is no small bowel obstruction or diverticulitis. The appendix is not seen but no inflammation is noted around cecum.  Fluid-filled bladder is normal. The uterus is normal. Small amount of free fluid is identified in the pelvis, likely physiologic. Minimal dependent atelectasis of the posterior lung bases are noted. No acute abnormality is identified within the visualized bones.  IMPRESSION: No acute abnormality identified in  the abdomen and pelvis.  Chronic changes of bilateral kidneys unchanged compared to prior exam.  Pancreatic duct dilatation unchanged compared to prior exam.  Retroperitoneal lymphadenopathy, smaller compared to prior CT of September 30, 2013.   Electronically Signed   By: Sherian Rein M.D.   On: 09/30/2014 20:22     Pelvic U/S 07/22/14: Study Result     CLINICAL DATA: Patient status post left oophorectomy. Abnormal uterine bleeding and dysmenorrhea since surgery.  EXAM: TRANSABDOMINAL AND TRANSVAGINAL ULTRASOUND OF PELVIS  TECHNIQUE: Both transabdominal and transvaginal ultrasound examinations of the pelvis were performed. Transabdominal technique was performed for global imaging of the pelvis including uterus, ovaries, adnexal regions, and pelvic cul-de-sac. It was necessary to proceed with endovaginal exam following the transabdominal exam to visualize the endometrium.  COMPARISON: Pelvic ultrasound 10/12/2013  FINDINGS: Uterus  Measurements: 8.7 x 4.0 x 3.7 cm. No fibroids  or other mass visualized.  Endometrium  Thickness: 6 mm. No focal abnormality visualized.  Right ovary  Measurements: 3.7 x 5.3 x 4.2 cm. Within the right ovary there is a 3.4 x 4.0 x 3.5 cm cyst with multiple internal echoes.  Left ovary  Surgically absent.  Other findings  No free fluid.  IMPRESSION: Cyst with internal echoes within the right ovary may represent a hemorrhagic cyst. Follow-up ultrasound in 6- 8 weeks is recommended to evaluate for interval resolution.  Endometrium measures 6 mm. If bleeding remains unresponsive to hormonal or medical therapy, sonohysterogram should be considered for focal lesion work-up. (Ref: Radiological Reasoning: Algorithmic Workup of Abnormal Vaginal Bleeding with Endovaginal Sonography and Sonohysterography. AJR 2008; 161:W96-04)   Electronically Signed  By: Annia Belt M.D.  On: 07/22/2014 14:40    I have personally  reviewed and evaluated these images and lab results as part of my medical decision-making.   EKG Interpretation None      MDM   Final diagnoses:  RLQ abdominal pain  Non-intractable vomiting with nausea, vomiting of unspecified type  UTI (lower urinary tract infection)  Pyelonephritis  Trichomonas infection  Premenstrual symptom    34 y.o. female here with acute on chronic lower abdominal pain 1 day. She states she has had pain intermittently since her oophorectomy earlier this year, pain always comes with menses. Started yesterday again. On exam, TTP in RLQ with some guarding. Recent pelvic U/S showed R ovarian cyst, could be from this. Pt just had pelvic exam, and is hesitant to doing another here today, no vaginal complaints. Will proceed with lab work and CT abd/pelvis to eval for appendicitis vs nephrolithiasis vs other etiology for pain, and hold off on pelvic exam for now. Will give pain meds, nausea meds, and reassess shortly.   5:19 PM Pain and nausea improved. U/A with large leuks, TNTC WBC, many bacteria, will treat as pyelonephritis with ceftriaxone. Trichomonas present on U/A as well, will treat this now since nausea improved. Will add on GC/CT to urine sample (previously negative in Dec 2015). All other labs unremarkable, Upreg pending, CT pending.   7:26 PM Upreg neg. CT still pending. Now feeling nausea and having increased pain, will give more zofran/morphine since this helped previously. Will reassess shortly.   9:02 PM CT without acute findings. Tolerating PO well, pain improved. Pain could be from early pyelonephritis, or premenstrual pain. Will d/c home with keflex x10 days, and nausea meds and home tramadol. F/up with PCP in 1wk. I explained the diagnosis and have given explicit precautions to return to the ER including for any other new or worsening symptoms. The patient understands and accepts the medical plan as it's been dictated and I have answered their  questions. Discharge instructions concerning home care and prescriptions have been given. The patient is STABLE and is discharged to home in good condition.  BP 112/72 mmHg  Pulse 69  Temp(Src) 98.3 F (36.8 C) (Oral)  Resp 18  Ht  (1.549 m)  Wt 150 lb (68.04 kg)  BMI 28.36 kg/m2  SpO2 100%  LMP 09/01/2014  Meds ordered this encounter  Medications  . morphine 4 MG/ML injection 4 mg    Sig:   . ondansetron (ZOFRAN) injection 4 mg    Sig:   . sodium chloride 0.9 % bolus 1,000 mL    Sig:   . iohexol (OMNIPAQUE) 300 MG/ML solution 25 mL    Sig:   . cefTRIAXone (ROCEPHIN) 1 g in dextrose 5 %  50 mL IVPB    Sig:   . metroNIDAZOLE (FLAGYL) tablet 2,000 mg    Sig:   . morphine 4 MG/ML injection 4 mg    Sig:   . ondansetron (ZOFRAN) injection 4 mg    Sig:   . iohexol (OMNIPAQUE) 300 MG/ML solution 100 mL    Sig:   . traMADol (ULTRAM) 50 MG tablet    Sig: Take 1 tablet (50 mg total) by mouth every 12 (twelve) hours as needed for moderate pain or severe pain.    Dispense:  15 tablet    Refill:  0    Order Specific Question:  Supervising Provider    Answer:  MILLER, BRIAN [[id number]]  . ondansetron (ZOFRAN) 8 MG tablet    Sig: Take 1 tablet (8 mg total) by mouth every 8 (eight) hours as needed for nausea or vomiting.    Dispense:  10 tablet    Refill:  0    Order Specific Question:  Supervising Provider    Answer:  Hyacinth Meeker, BRIAN [[id number]]  . cephALEXin (KEFLEX) 500 MG capsule    Sig: 2 caps po bid x 10 days    Dispense:  40 capsule    Refill:  0    Order Specific Question:  Supervising Provider    Answer:  Eber Hong [[id number]]     Mercedes Camprubi-Soms, PA-C 09/30/14 2103  Courteney Lyn Mackuen, MD 10/01/14 0454

## 2014-09-30 NOTE — ED Notes (Signed)
Pt here with c/o chronic abd cramping and nausea since partial hysterectomy last year. Pt reports worse last night. Pt also reports that she is out of tramadol that she normally takes for pain.

## 2014-09-30 NOTE — Discharge Instructions (Signed)
Stay very well hydrated with plenty of water throughout the day. Take antibiotic until completed. Use zofran as needed for nausea. Use tramadol as directed as needed for pain but don't drive while taking this medication. You were treated for trichomonas, you must have all sexual partners tested and treated. Always wear condoms. Follow up with primary care physician in 1 week for recheck of ongoing symptoms but return to ER for emergent changing or worsening of symptoms. Please seek immediate care if you develop the following: You develop back pain.  Your symptoms are no better, or worse in 3 days. There is severe back pain or lower abdominal pain.  You develop chills.  You have a fever.  There is nausea or vomiting.  There is continued burning or discomfort with urination.     Abdominal Pain Many things can cause belly (abdominal) pain. Most times, the belly pain is not dangerous. Many cases of belly pain can be watched and treated at home. HOME CARE   Do not take medicines that help you go poop (laxatives) unless told to by your doctor.  Only take medicine as told by your doctor.  Eat or drink as told by your doctor. Your doctor will tell you if you should be on a special diet. GET HELP IF:  You do not know what is causing your belly pain.  You have belly pain while you are sick to your stomach (nauseous) or have runny poop (diarrhea).  You have pain while you pee or poop.  Your belly pain wakes you up at night.  You have belly pain that gets worse or better when you eat.  You have belly pain that gets worse when you eat fatty foods.  You have a fever. GET HELP RIGHT AWAY IF:   The pain does not go away within 2 hours.  You keep throwing up (vomiting).  The pain changes and is only in the right or left part of the belly.  You have bloody or tarry looking poop. MAKE SURE YOU:   Understand these instructions.  Will watch your condition.  Will get help right away if  you are not doing well or get worse. Document Released: 06/06/2007 Document Revised: 12/23/2012 Document Reviewed: 08/27/2012 [facility] Patient Information 2015 [place], [place]. This information is not intended to replace advice given to you by your health care provider. Make sure you discuss any questions you have with your health care provider.  Nausea and Vomiting Nausea means you feel sick to your stomach. Throwing up (vomiting) is a reflex where stomach contents come out of your mouth. HOME CARE   Take medicine as told by your doctor.  Do not force yourself to eat. However, you do need to drink fluids.  If you feel like eating, eat a normal diet as told by your doctor.  Eat rice, wheat, potatoes, bread, lean meats, yogurt, fruits, and vegetables.  Avoid high-fat foods.  Drink enough fluids to keep your pee (urine) clear or pale yellow.  Ask your doctor how to replace body fluid losses (rehydrate). Signs of body fluid loss (dehydration) include:  Feeling very thirsty.  Dry lips and mouth.  Feeling dizzy.  Dark pee.  Peeing less than normal.  Feeling confused.  Fast breathing or heart rate. GET HELP RIGHT AWAY IF:   You have blood in your throw up.  You have black or bloody poop (stool).  You have a bad headache or stiff neck.  You feel confused.  You have bad  belly (abdominal) pain.  You have chest pain or trouble breathing.  You do not pee at least once every 8 hours.  You have cold, clammy skin.  You keep throwing up after 24 to 48 hours.  You have a fever. MAKE SURE YOU:   Understand these instructions.  Will watch your condition.  Will get help right away if you are not doing well or get worse. Document Released: 06/06/2007 Document Revised: 03/12/2011 Document Reviewed: 05/19/2010 [facility] Patient Information 2015 [place], [place]. This information is not intended to replace advice given to you by your health care provider. Make sure you discuss  any questions you have with your health care provider.  Pyelonephritis, Adult Pyelonephritis is a kidney infection. A kidney infection can happen quickly, or it can last for a long time. HOME CARE   Take your medicine (antibiotics) as told. Finish it even if you start to feel better.  Keep all doctor visits as told.  Drink enough fluids to keep your pee (urine) clear or pale yellow.  Only take medicine as told by your doctor. GET HELP RIGHT AWAY IF:   You have a fever or lasting symptoms for more than 2-3 days.  You have a fever and your symptoms suddenly get worse.  You cannot take your medicine or drink fluids as told.  You have chills and shaking.  You feel very weak or pass out (faint).  You do not feel better after 2 days. MAKE SURE YOU:  Understand these instructions.  Will watch your condition.  Will get help right away if you are not doing well or get worse. Document Released: 01/26/2004 Document Revised: 06/19/2011 Document Reviewed: 06/07/2010 [facility] Patient Information 2015 [place], [place]. This information is not intended to replace advice given to you by your health care provider. Make sure you discuss any questions you have with your health care provider.  Premenstrual Syndrome Premenstrual syndrome (PMS) is a condition that consists of physical, emotional, and behavioral symptoms that affect women of childbearing age. PMS occurs 5-14 days before the start of a menstrual period and often recurs in a predictable pattern. The symptoms go away a few days after the menstrual period starts. PMS can interfere in many ways with normal daily activities and can range from mild to severe. When PMS is considered severe, it may be diagnosed as premenstrual dysphoric disorder (PMDD). A small percentage of women are affected by PMS symptoms and an even smaller percentage of those women are affected by PMDD.  CAUSES  The exact cause of PMS is unknown, but it seems to be  related to cyclic hormone changes that happen before menstruation. These hormones are thought to affect chemicals in the brain (serotonin) that can influence a person's mood.  SYMPTOMS  Symptoms of PMS recur consistently from month to month and go away completely after the menstrual period starts. The most common emotional or behavioral symptom is mood swings. These mood swings can be disabling and interfere with normal activities of daily living. Other common symptoms include depression and angry outbursts. Other symptoms may include:   Irritability.  Anxiety.  Crying spells.   Food cravings or appetite changes.   Changes in sexual desire.   Confusion.   Aggression.   Social withdrawal.   Poor concentration. The most common physical symptoms include a sense of bloating, breast pain, headaches, and extreme fatigue. Other physical symptoms include:   Backaches.   Swelling of the hands and feet.   Weight gain.   Hot  flashes.  DIAGNOSIS  To make a diagnosis, your caregiver will ask questions to confirm that you are having a pattern of symptoms. Symptoms must:   Be present 5 days before the start of your period and be present at least 3 months in a row.   End within 4 days after your period starts.   Interfere with some of your normal activities.  Other conditions, such as thyroid disease, depression, and migraine headaches must be ruled out before a diagnosis of PMS is confirmed.  TREATMENT  Your caregiver may suggest ways to maintain a healthy lifestyle, such as exercise. Over-the-counter pain relievers may ease cramps, aches, pains, headaches, and breast tenderness. However, selective serotonin reuptake inhibitors (SSRIs) are medicines that are most beneficial in improving PMS if taken in the second half of the monthly cycle. They may be taken on a daily basis. The most effective oral contraceptive pill used for symptoms of PMS is one that contains the ingredient  drospirenone. Taking 4 days off of the pill instead of the usual 7 days also has shown to increase effectiveness.  There are a number of drugs, dietary supplements, vitamins, and water pills (diuretics) which have been suggested to be helpful but have not shown to be of any benefit to improving PMS symptoms.  HOME CARE INSTRUCTIONS   For 2-3 months, write down your symptoms, their severity, and how long they last. This may help your caregiver prescribe the best treatment for your symptoms.  Exercise regularly as suggested by your caregiver.  Eat a regular, well-balanced diet.  Avoid caffeine, alcohol, and tobacco consumption.  Limit salt and salty foods to lessen bloating and fluid retention.  Get enough sleep. Practice relaxation techniques.  Drink enough fluids to keep your urine clear or pale yellow.  Take medicines as directed by your caregiver.  Limit stress.  Take a multivitamin as directed by your caregiver. Document Released: 12/16/1999 Document Revised: 09/12/2011 Document Reviewed: 05/07/2011 [facility] Patient Information 2015 [place], [place]. This information is not intended to replace advice given to you by your health care provider. Make sure you discuss any questions you have with your health care provider.  Trichomoniasis Trichomoniasis is an infection caused by an organism called Trichomonas. The infection can affect both women and men. In women, the outer female genitalia and the vagina are affected. In men, the penis is mainly affected, but the prostate and other reproductive organs can also be involved. Trichomoniasis is a sexually transmitted infection (STI) and is most often passed to another person through sexual contact.  RISK FACTORS  Having unprotected sexual intercourse.  Having sexual intercourse with an infected partner. SIGNS AND SYMPTOMS  Symptoms of trichomoniasis in women include:  Abnormal gray-green frothy vaginal discharge.  Itching and  irritation of the vagina.  Itching and irritation of the area outside the vagina. Symptoms of trichomoniasis in men include:   Penile discharge with or without pain.  Pain during urination. This results from inflammation of the urethra. DIAGNOSIS  Trichomoniasis may be found during a Pap test or physical exam. Your health care provider may use one of the following methods to help diagnose this infection:  Examining vaginal discharge under a microscope. For men, urethral discharge would be examined.  Testing the pH of the vagina with a test tape.  Using a vaginal swab test that checks for the Trichomonas organism. A test is available that provides results within a few minutes.  Doing a culture test for the organism. This is not usually  needed. TREATMENT   You may be given medicine to fight the infection. Women should inform their health care provider if they could be or are pregnant. Some medicines used to treat the infection should not be taken during pregnancy.  Your health care provider may recommend over-the-counter medicines or creams to decrease itching or irritation.  Your sexual partner will need to be treated if infected. HOME CARE INSTRUCTIONS   Take medicines only as directed by your health care provider.  Take over-the-counter medicine for itching or irritation as directed by your health care provider.  Do not have sexual intercourse while you have the infection.  Women should not douche or wear tampons while they have the infection.  Discuss your infection with your partner. Your partner may have gotten the infection from you, or you may have gotten it from your partner.  Have your sex partner get examined and treated if necessary.  Practice safe, informed, and protected sex.  See your health care provider for other STI testing. SEEK MEDICAL CARE IF:   You still have symptoms after you finish your medicine.  You develop abdominal pain.  You have pain when  you urinate.  You have bleeding after sexual intercourse.  You develop a rash.  Your medicine makes you sick or makes you throw up (vomit). MAKE SURE YOU:  Understand these instructions.  Will watch your condition.  Will get help right away if you are not doing well or get worse. Document Released: 06/13/2000 Document Revised: 05/04/2013 Document Reviewed: 09/29/2012 [facility] Patient Information 2015 [place], [place]. This information is not intended to replace advice given to you by your health care provider. Make sure you discuss any questions you have with your health care provider.  Sexually Transmitted Disease A sexually transmitted disease (STD) is a disease or infection that may be passed (transmitted) from person to person, usually during sexual activity. This may happen by way of saliva, semen, blood, vaginal mucus, or urine. Common STDs include:   Gonorrhea.   Chlamydia.   Syphilis.   HIV and AIDS.   Genital herpes.   Hepatitis B and C.   Trichomonas.   Human papillomavirus (HPV).   Pubic lice.   Scabies.  Mites.  Bacterial vaginosis. WHAT ARE CAUSES OF STDs? An STD may be caused by bacteria, a virus, or parasites. STDs are often transmitted during sexual activity if one person is infected. However, they may also be transmitted through nonsexual means. STDs may be transmitted after:   Sexual intercourse with an infected person.   Sharing sex toys with an infected person.   Sharing needles with an infected person or using unclean piercing or tattoo needles.  Having intimate contact with the genitals, mouth, or rectal areas of an infected person.   Exposure to infected fluids during birth. WHAT ARE THE SIGNS AND SYMPTOMS OF STDs? Different STDs have different symptoms. Some people may not have any symptoms. If symptoms are present, they may include:   Painful or bloody urination.   Pain in the pelvis, abdomen, vagina, anus, throat, or  eyes.   A skin rash, itching, or irritation.  Growths, ulcerations, blisters, or sores in the genital and anal areas.  Abnormal vaginal discharge with or without bad odor.   Penile discharge in men.   Fever.   Pain or bleeding during sexual intercourse.   Swollen glands in the groin area.   Yellow skin and eyes (jaundice). This is seen with hepatitis.   Swollen testicles.  Infertility.  Sores and blisters in the mouth. HOW ARE STDs DIAGNOSED? To make a diagnosis, your health care provider may:   Take a medical history.   Perform a physical exam.   Take a sample of any discharge to examine.  Swab the throat, cervix, opening to the penis, rectum, or vagina for testing.  Test a sample of your first morning urine.   Perform blood tests.   Perform a Pap test, if this applies.   Perform a colposcopy.   Perform a laparoscopy.  HOW ARE STDs TREATED? Treatment depends on the STD. Some STDs may be treated but not cured.   Chlamydia, gonorrhea, trichomonas, and syphilis can be cured with antibiotic medicine.   Genital herpes, hepatitis, and HIV can be treated, but not cured, with prescribed medicines. The medicines lessen symptoms.   Genital warts from HPV can be treated with medicine or by freezing, burning (electrocautery), or surgery. Warts may come back.   HPV cannot be cured with medicine or surgery. However, abnormal areas may be removed from the cervix, vagina, or vulva.   If your diagnosis is confirmed, your recent sexual partners need treatment. This is true even if they are symptom-free or have a negative culture or evaluation. They should not have sex until their health care providers say it is okay. HOW CAN I REDUCE MY RISK OF GETTING AN STD? Take these steps to reduce your risk of getting an STD:  Use latex condoms, dental dams, and water-soluble lubricants during sexual activity. Do not use petroleum jelly or oils.  Avoid having  multiple sex partners.  Do not have sex with someone who has other sex partners.  Do not have sex with anyone you do not know or who is at high risk for an STD.  Avoid risky sex practices that can break your skin.  Do not have sex if you have open sores on your mouth or skin.  Avoid drinking too much alcohol or taking illegal drugs. Alcohol and drugs can affect your judgment and put you in a vulnerable position.  Avoid engaging in oral and anal sex acts.  Get vaccinated for HPV and hepatitis. If you have not received these vaccines in the past, talk to your health care provider about whether one or both might be right for you.   If you are at risk of being infected with HIV, it is recommended that you take a prescription medicine daily to prevent HIV infection. This is called pre-exposure prophylaxis (PrEP). You are considered at risk if:  You are a man who has sex with other men (MSM).  You are a heterosexual man or woman and are sexually active with more than one partner.  You take drugs by injection.  You are sexually active with a partner who has HIV.  Talk with your health care provider about whether you are at high risk of being infected with HIV. If you choose to begin PrEP, you should first be tested for HIV. You should then be tested every 3 months for as long as you are taking PrEP.  WHAT SHOULD I DO IF I THINK I HAVE AN STD?  See your health care provider.   Tell your sexual partner(s). They should be tested and treated for any STDs.  Do not have sex until your health care provider says it is okay. WHEN SHOULD I GET IMMEDIATE MEDICAL CARE? Contact your health care provider right away if:   You have severe abdominal pain.  You are a  man and notice swelling or pain in your testicles.  You are a woman and notice swelling or pain in your vagina. Document Released: 03/10/2002 Document Revised: 12/23/2012 Document Reviewed: 07/08/2012 [facility] Patient Information  2015 [place], [place]. This information is not intended to replace advice given to you by your health care provider. Make sure you discuss any questions you have with your health care provider.

## 2014-10-01 ENCOUNTER — Other Ambulatory Visit: Payer: Self-pay | Admitting: Family Medicine

## 2014-10-01 NOTE — Telephone Encounter (Signed)
This has been filled by another provider. Thanks!

## 2014-10-01 NOTE — Telephone Encounter (Signed)
Her Tramadol was prescribed less than a month ago for painful menstural cramps. She should not be out. Plus she was referred to GYN. She will need to get further pain medication for this reason from him.

## 2014-10-01 NOTE — Telephone Encounter (Signed)
Refill request for tramadol. LOV 07/07/2014. Please advise. Thanks!

## 2014-10-02 LAB — URINE CULTURE

## 2014-10-07 ENCOUNTER — Ambulatory Visit: Payer: Medicaid Other | Admitting: Family Medicine

## 2014-10-11 ENCOUNTER — Ambulatory Visit: Payer: Medicaid Other | Admitting: Internal Medicine

## 2014-10-27 ENCOUNTER — Other Ambulatory Visit: Payer: Self-pay | Admitting: Internal Medicine

## 2014-10-28 ENCOUNTER — Ambulatory Visit (INDEPENDENT_AMBULATORY_CARE_PROVIDER_SITE_OTHER): Payer: Medicaid Other | Admitting: Family Medicine

## 2014-10-28 VITALS — BP 111/75 | HR 87 | Resp 18 | Wt 151.0 lb

## 2014-10-28 DIAGNOSIS — N946 Dysmenorrhea, unspecified: Secondary | ICD-10-CM | POA: Diagnosis not present

## 2014-10-28 DIAGNOSIS — N390 Urinary tract infection, site not specified: Secondary | ICD-10-CM

## 2014-10-28 MED ORDER — NITROFURANTOIN MACROCRYSTAL 100 MG PO CAPS: 100.0000 mg | ORAL_CAPSULE | Freq: Four times a day (QID) | ORAL | Status: DC

## 2014-10-28 MED ORDER — NITROFURANTOIN MACROCRYSTAL 100 MG PO CAPS: 100.0000 mg | ORAL_CAPSULE | Freq: Two times a day (BID) | ORAL | Status: DC

## 2014-10-28 NOTE — Progress Notes (Signed)
Patient ID: Kara Allen, female   DOB: 12-03-1980, 34 y.o.   MRN: [id number]   Derin Matthes, is a 34 y.o. female  [id number]  [id number]  DOB - [birth year]  CC:  Chief Complaint  Patient presents with  . Follow-up    heavy menstruation; cramps with vomiting       HPI: Kara Allen is a 34 y.o. female here to follow-up for heavy, painful menstural periods. She has been referred to GYN who has evaluated her and offered a plan. However she does not like their plan (birth control pills). She also thinks she might have a UTI. She reports urgency and hesitancy. Does have a history of UTIs.She has a history of vescularuretheral reflux. She is HIV positive and is followed at [facility]. She declines a flu shot today. I have explained that she needs to follow-up with GYN for her continued problems with dysmenorrhea.  Allergies  Allergen Reactions  . Ibuprofen Rash and Other (See Comments)    "Irritates my bladder"  . Latex Other (See Comments)    If pt wears latex gloves or any contact with latex causes itching and rash. Some tapes also cause itching, skin irritation - paper tape seems to be okay  . Sulfamethoxazole-Trimethoprim Other (See Comments)    Rash, chills and hives  . Tylenol [Acetaminophen] Other (See Comments)    Makes me pee more- irritates bladder   Past Medical History  Diagnosis Date  . HIV (human immunodeficiency virus infection)   . Trichomonas infection   . Pyelonephritis   . Recurrent boils   . Urinary frequency     and nocturia  . Hydronephrosis, left   . Cyst of ovary     PT STATES SHE IS TAKING ANTIBIOTICS FOR CYST ON OVARY   Current Outpatient Prescriptions on File Prior to Visit  Medication Sig Dispense Refill  . EDURANT 25 MG TABS tablet TAKE ONE TABLET BY MOUTH EVERY MORNING WITH BREAKFAST 30 tablet 3  . EPIVIR 10 MG/ML solution TAKE 30 MILLILITERS (2 TABLESPOONFULS) BY MOUTH ONCE DAILY 960 mL 3  . ondansetron (ZOFRAN) 8 MG tablet Take 1 tablet  (8 mg total) by mouth every 8 (eight) hours as needed for nausea or vomiting. 10 tablet 0  . TIVICAY 50 MG tablet TAKE ONE (1) TABLET BY MOUTH EVERY DAY 30 tablet 3  . amoxicillin (AMOXIL) 400 MG/5ML suspension Take 6 mLs (480 mg total) by mouth 3 (three) times daily. (Patient not taking: Reported on 07/07/2014) 200 mL 1  . cephALEXin (KEFLEX) 500 MG capsule 2 caps po bid x 10 days (Patient not taking: Reported on 10/28/2014) 40 capsule 0  . fluconazole (DIFLUCAN) 100 MG tablet Take 1 tablet (100 mg total) by mouth once. Repeat dose in 48-72 hour. (Patient not taking: Reported on 09/30/2014) 3 tablet 0  . metroNIDAZOLE (FLAGYL) 500 MG tablet Take 1 tablet (500 mg total) by mouth 2 (two) times daily. (Patient not taking: Reported on 09/30/2014) 14 tablet 2  . traMADol (ULTRAM) 50 MG tablet Take 1 tablet (50 mg total) by mouth every 8 (eight) hours as needed for moderate pain. (Patient not taking: Reported on 10/28/2014) 30 tablet 0  . traMADol (ULTRAM) 50 MG tablet Take 1 tablet (50 mg total) by mouth every 12 (twelve) hours as needed for moderate pain or severe pain. (Patient not taking: Reported on 10/28/2014) 15 tablet 0   No current facility-administered medications on file prior to visit.   Family History  Problem Relation Age of  Onset  . HIV Mother    Social History   Social History  . Marital Status: Single    Spouse Name: N/A  . Number of Children: N/A  . Years of Education: N/A   Occupational History  . Not on file.   Social History Main Topics  . Smoking status: Current Every Day Smoker -- 0.10 packs/day for 14 years    Types: Cigarettes, Cigars  . Smokeless tobacco: Never Used     Comment: smoking 1-2 black and milds per day  . Alcohol Use: No     Comment:  - does smoke tobacco product called blackmilds  . Drug Use: 7.00 per week    Special: Marijuana     Comment: 2-3 joints/daily- 12/30/13- pt advised not to smoke marijuana night before or am of surgery  . Sexual Activity:     Partners: Male    Birth Control/ Protection: Condom   Other Topics Concern  . Not on file   Social History Narrative    Review of Systems: Constitutional: Negative  HENT: Negative  Eyes: Negative  Neck: Negative  Respiratory: Negative  Cardiovascular: Negative  Gastrointestinal: Negative  Genitourinary: positive for urgency and hesitancy. Musculoskeletal: Negative Neurological: Negative   Hematological: Negative  Psychiatric/Behavioral: Negative     Objective:   Filed Vitals:   10/28/14 1315  BP: 111/75  Pulse: 87  Resp: 18    Physical Exam: Constitutional: Patient appears well-developed and well-nourished. No distress. HENT: Normocephalic, atraumatic, External right and left ear normal. Oropharynx is clear and moist.  Eyes: Conjunctivae and EOM are normal. PERRLA, no scleral icterus. Neck: Normal ROM. Neck supple. No lymphadenopathy, No thyromegaly. CVS: RRR, S1/S2 +, no murmurs, no gallops, no rubs Pulmonary: Effort and breath sounds normal, no stridor, rhonchi, wheezes, rales.  Abdominal: Soft. Normoactive BS,, no distension, tenderness, rebound or guarding.  Musculoskeletal: Normal range of motion. No edema and no tenderness.  Neuro: Alert.Normal muscle tone coordination. Non-focal Skin: Skin is warm and dry. No rash noted. Not diaphoretic. No erythema. No pallor. Psychiatric: Normal mood and affect. Behavior, judgment, thought content normal. GU: There is no suprapubic tenderness but a dip does show nitrates and leukocytes. GYN: dysmenorrhea (referred to GYN)/  Lab Results  Component Value Date   WBC 5.5 09/30/2014   HGB 12.3 09/30/2014   HCT 37.2 09/30/2014   MCV 92.8 09/30/2014   PLT 249 09/30/2014   Lab Results  Component Value Date   CREATININE 0.63 09/30/2014   BUN 8 09/30/2014   NA 133* 09/30/2014   K 4.3 09/30/2014   CL 102 09/30/2014   CO2 23 09/30/2014    No results found for: HGBA1C Lipid Panel     Component Value Date/Time    CHOL 117 07/07/2014 1153   TRIG 75 07/07/2014 1153   HDL 33* 07/07/2014 1153   CHOLHDL 3.5 07/07/2014 1153   VLDL 15 07/07/2014 1153   LDLCALC 69 07/07/2014 1153       Assessment and plan:   1. UTI - Urine culture -macrodantin 100 mg #28, one po qid for 7 days. -increased water intake.  2. Dysmenorrhea - Follow-up with GYN.  3. Health maintenance -she declines flu shot today.   Return for other issues.  The patient was given clear instructions to go to ER or return to medical center if symptoms don't improve, worsen or new problems develop. The patient verbalized understanding.      Henrietta HooverLinda C. Isa Kohlenberg, MSN, FNP-BC   10/28/2014, 2:27 PM

## 2014-10-28 NOTE — Patient Instructions (Signed)
Follow-up with GYN about painful periods and abnormal PAP Hopefully the birth control they want to prescribe will solve the problem

## 2014-10-29 LAB — POCT URINALYSIS DIP (DEVICE)
BILIRUBIN URINE: NEGATIVE
Glucose, UA: NEGATIVE mg/dL
Ketones, ur: NEGATIVE mg/dL
NITRITE: POSITIVE — AB
PH: 7 (ref 5.0–8.0)
PROTEIN: 100 mg/dL — AB
Specific Gravity, Urine: 1.02 (ref 1.005–1.030)
UROBILINOGEN UA: 0.2 mg/dL (ref 0.0–1.0)

## 2014-11-02 ENCOUNTER — Telehealth: Payer: Self-pay | Admitting: *Deleted

## 2014-11-02 NOTE — Telephone Encounter (Signed)
Patient called for follow up appointment (missed appointments in September).  She needs Wednesday afternoons. First available is with Dr. Orvan Falconerampbell 11/15.  She was excited, wondered why she was transferred from Dr. Orvan Falconerampbell to Dr. Luciana Axeomer.  RN advised it may have been based on availability after she returned to care with a 5 year absence. Andree CossHowell, Michelle M, RN

## 2014-11-03 ENCOUNTER — Other Ambulatory Visit: Payer: Medicaid Other

## 2014-11-03 NOTE — Telephone Encounter (Signed)
Yes, she certainly can go back to his care.  It was an availability issues before.

## 2014-11-04 ENCOUNTER — Other Ambulatory Visit: Payer: Medicaid Other

## 2014-11-04 DIAGNOSIS — B20 Human immunodeficiency virus [HIV] disease: Secondary | ICD-10-CM

## 2014-11-04 DIAGNOSIS — Z113 Encounter for screening for infections with a predominantly sexual mode of transmission: Secondary | ICD-10-CM

## 2014-11-04 DIAGNOSIS — Z79899 Other long term (current) drug therapy: Secondary | ICD-10-CM

## 2014-11-04 LAB — CBC WITH DIFFERENTIAL/PLATELET
Basophils Absolute: 0 10*3/uL (ref 0.0–0.1)
Basophils Relative: 1 % (ref 0–1)
EOS ABS: 0.1 10*3/uL (ref 0.0–0.7)
EOS PCT: 2 % (ref 0–5)
HEMATOCRIT: 34.2 % — AB (ref 36.0–46.0)
Hemoglobin: 11.3 g/dL — ABNORMAL LOW (ref 12.0–15.0)
LYMPHS PCT: 40 % (ref 12–46)
Lymphs Abs: 1.1 10*3/uL (ref 0.7–4.0)
MCH: 30.8 pg (ref 26.0–34.0)
MCHC: 33 g/dL (ref 30.0–36.0)
MCV: 93.2 fL (ref 78.0–100.0)
MONO ABS: 0.3 10*3/uL (ref 0.1–1.0)
MPV: 11 fL (ref 8.6–12.4)
Monocytes Relative: 11 % (ref 3–12)
Neutro Abs: 1.2 10*3/uL — ABNORMAL LOW (ref 1.7–7.7)
Neutrophils Relative %: 46 % (ref 43–77)
PLATELETS: 197 10*3/uL (ref 150–400)
RBC: 3.67 MIL/uL — AB (ref 3.87–5.11)
RDW: 15.2 % (ref 11.5–15.5)
WBC: 2.7 10*3/uL — AB (ref 4.0–10.5)

## 2014-11-04 LAB — COMPREHENSIVE METABOLIC PANEL
ALT: 13 U/L (ref 6–29)
AST: 27 U/L (ref 10–30)
Albumin: 3.3 g/dL — ABNORMAL LOW (ref 3.6–5.1)
Alkaline Phosphatase: 72 U/L (ref 33–115)
BUN: 9 mg/dL (ref 7–25)
CALCIUM: 8.9 mg/dL (ref 8.6–10.2)
CO2: 25 mmol/L (ref 20–31)
Chloride: 102 mmol/L (ref 98–110)
Creat: 0.59 mg/dL (ref 0.50–1.10)
GLUCOSE: 84 mg/dL (ref 65–99)
POTASSIUM: 3.5 mmol/L (ref 3.5–5.3)
Sodium: 133 mmol/L — ABNORMAL LOW (ref 135–146)
Total Bilirubin: 0.3 mg/dL (ref 0.2–1.2)
Total Protein: 8.5 g/dL — ABNORMAL HIGH (ref 6.1–8.1)

## 2014-11-04 LAB — LIPID PANEL
CHOL/HDL RATIO: 2.8 ratio (ref <=5.0)
CHOLESTEROL: 146 mg/dL (ref 125–200)
HDL: 53 mg/dL (ref >=46)
LDL CALC: 79 mg/dL (ref <130)
Triglycerides: 70 mg/dL (ref <150)
VLDL: 14 mg/dL (ref <30)

## 2014-11-05 LAB — T-HELPER CELL (CD4) - (RCID CLINIC ONLY)
CD4 % Helper T Cell: 15 % — ABNORMAL LOW (ref 33–55)
CD4 T CELL ABS: 150 /uL — AB (ref 400–2700)

## 2014-11-05 LAB — RPR

## 2014-11-08 LAB — HIV-1 RNA QUANT-NO REFLEX-BLD

## 2014-11-17 ENCOUNTER — Ambulatory Visit: Payer: Medicaid Other | Admitting: Internal Medicine

## 2014-12-02 ENCOUNTER — Ambulatory Visit: Payer: Medicaid Other | Admitting: Internal Medicine

## 2015-01-19 ENCOUNTER — Ambulatory Visit (INDEPENDENT_AMBULATORY_CARE_PROVIDER_SITE_OTHER): Payer: Medicaid Other | Admitting: Internal Medicine

## 2015-01-19 ENCOUNTER — Encounter: Payer: Self-pay | Admitting: Internal Medicine

## 2015-01-19 DIAGNOSIS — B20 Human immunodeficiency virus [HIV] disease: Secondary | ICD-10-CM

## 2015-01-19 DIAGNOSIS — Z21 Asymptomatic human immunodeficiency virus [HIV] infection status: Secondary | ICD-10-CM | POA: Diagnosis present

## 2015-01-19 NOTE — Assessment & Plan Note (Signed)
Her last viral load was undetectable but she is missing too many doses. I had her set her cell phone alarm to remind her and I talked to her about the importance of not missing doses. I will check lab work again today including a G6PD level. She will return with all of her medications in one week.

## 2015-01-19 NOTE — Progress Notes (Signed)
Patient Active Problem List   Diagnosis Date Noted  . HIV (human immunodeficiency virus infection) (HCC) 10/01/2013    Priority: High  . Gum inflammation 06/02/2014  . Pelvic pain in female   . Left ovarian cyst   . Back pain   . 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 Hydronephrosis, left 10/01/2013  . BOILS, RECURRENT 02/12/2008  . ANXIETY 01/30/2006  . CIGARETTE SMOKER 01/30/2006  . Depression 01/30/2006  . Mild cognitive impairment 01/30/2006  . ALLERGIC RHINITIS 01/30/2006  . SYMPTOM, ENURESIS, NOCTURNAL 01/30/2006    Patient's Medications  New Prescriptions   No medications on file  Previous Medications   EDURANT 25 MG TABS TABLET    TAKE ONE TABLET BY MOUTH EVERY MORNING WITH BREAKFAST   EPIVIR 10 MG/ML SOLUTION    TAKE 30 MILLILITERS (2 TABLESPOONFULS) BY MOUTH ONCE DAILY   TIVICAY 50 MG TABLET    TAKE ONE (1) TABLET BY MOUTH EVERY DAY  Modified Medications   No medications on file  Discontinued Medications   ONDANSETRON (ZOFRAN) 8 MG TABLET    Take 1 tablet (8 mg total) by mouth every 8 (eight) hours as needed for nausea or vomiting.   TRAMADOL (ULTRAM) 50 MG TABLET    Take 1 tablet (50 mg total) by mouth every 12 (twelve) hours as needed for moderate pain or severe pain.    Subjective: Zen is in for her routine HIV follow-up visit. She moved to [place] for 4 years but it has been back here for the past year seeing my partner, Dr. Luciana Axe. Her son, Rayfield Citizen, is with her. She doesn't know the names of her 3 HIV medicines (liquid Epivir, Tivicay and Edurant). She has trouble picking him off of the chart. She does not think that Tivicay looks like her pill. She recently changed the time that she takes her medications. She used to take them in the evening but found that she was forgetting says he switch to take it in the morning. She gets Demetrius up about 6 AM and tries to take her medication around 10 AM. She estimates that she has missed about 5 doses in  the past month. Her only current complaint is dysmenorrhea.  Review of Systems: Review of Systems  Constitutional: Negative for fever, chills, weight loss and malaise/fatigue.  Respiratory: Negative for cough, sputum production and shortness of breath.   Cardiovascular: Negative for chest pain.  Gastrointestinal: Negative for nausea, vomiting, abdominal pain and diarrhea.  Genitourinary: Negative for dysuria.  Skin: Negative for rash.  Psychiatric/Behavioral: Negative for depression and substance abuse. The patient is not nervous/anxious.     Past Medical History  Diagnosis Date  . HIV (human immunodeficiency virus infection) (HCC)   . Trichomonas infection   . Pyelonephritis   . Recurrent boils   . Urinary frequency     and nocturia  . Hydronephrosis, left   . Cyst of ovary     PT STATES SHE IS TAKING ANTIBIOTICS FOR CYST ON OVARY    Social History  Substance Use Topics  . Smoking status: Current Every Day Smoker -- 0.10 packs/day for 14 years    Types: Cigarettes, Cigars  . Smokeless tobacco: Never Used     Comment: smoking 1-2 black and milds per day  . Alcohol Use: No     Comment:  - does smoke tobacco product called blackmilds    Family History  Problem Relation Age of Onset  . HIV Mother  Allergies  Allergen Reactions  . Ibuprofen Rash and Other (See Comments)    "Irritates my bladder"  . Latex Other (See Comments)    If pt wears latex gloves or any contact with latex causes itching and rash. Some tapes also cause itching, skin irritation - paper tape seems to be okay  . Sulfamethoxazole-Trimethoprim Other (See Comments)    Rash, chills and hives  . Tylenol [Acetaminophen] Other (See Comments)    Makes me pee more- irritates bladder    Objective:  Filed Vitals:   01/19/15 1500  BP: 109/77  Pulse: 96  Temp: 97.5 F (36.4 C)  TempSrc: Oral  Weight: 155 lb (70.308 kg)   Body mass index is 29.3 kg/(m^2).  Physical Exam  Constitutional: She is  oriented to person, place, and time.  She is smiling and in good spirits.  HENT:  Mouth/Throat: No oropharyngeal exudate.  Eyes: Conjunctivae are normal.  Cardiovascular: Normal rate and regular rhythm.   No murmur heard. Pulmonary/Chest: Breath sounds normal.  Abdominal: Soft. There is no tenderness.  Neurological: She is alert and oriented to person, place, and time.  Skin: No rash noted.  Psychiatric: Mood and affect normal.    Lab Results Lab Results  Component Value Date   WBC 2.7* 11/04/2014   HGB 11.3* 11/04/2014   HCT 34.2* 11/04/2014   MCV 93.2 11/04/2014   PLT 197 11/04/2014    Lab Results  Component Value Date   CREATININE 0.59 11/04/2014   BUN 9 11/04/2014   NA 133* 11/04/2014   K 3.5 11/04/2014   CL 102 11/04/2014   CO2 25 11/04/2014    Lab Results  Component Value Date   ALT 13 11/04/2014   AST 27 11/04/2014   ALKPHOS 72 11/04/2014   BILITOT 0.3 11/04/2014    Lab Results  Component Value Date   CHOL 146 11/04/2014   HDL 53 11/04/2014   LDLCALC 79 11/04/2014   TRIG 70 11/04/2014   CHOLHDL 2.8 11/04/2014    Lab Results HIV 1 RNA QUANT (copies/mL)  Date Value  11/04/2014 <20  06/02/2014 230*  04/28/2014 27*   CD4 T CELL ABS (/uL)  Date Value  11/04/2014 150*  06/02/2014 180*  04/28/2014 140*      Problem List Items Addressed This Visit      High   HIV (human immunodeficiency virus infection) (HCC)    Her last viral load was undetectable but she is missing too many doses. I had her set her cell phone alarm to remind her and I talked to her about the importance of not missing doses. I will check lab work again today including a G6PD level. She will return with all of her medications in one week.      Relevant Orders   T-helper cell (CD4)- ([facility] clinic only)   HIV 1 RNA quant-no reflex-bld   Glucose 6 phosphate dehydrogenase        Cliffton Asters, MD [facility] for Infectious Disease [facility] [phone] pager    [phone] cell 01/19/2015, 3:28 PM

## 2015-01-20 LAB — GLUCOSE 6 PHOSPHATE DEHYDROGENASE: G-6PDH: 11.3 U/g{Hb} (ref 7.0–20.5)

## 2015-01-21 LAB — HIV-1 RNA QUANT-NO REFLEX-BLD
HIV 1 RNA QUANT: 11974 {copies}/mL — AB (ref <20)
HIV-1 RNA Quant, Log: 4.08 Log copies/mL — ABNORMAL HIGH (ref <1.30)

## 2015-01-26 ENCOUNTER — Other Ambulatory Visit: Payer: Medicaid Other | Admitting: Internal Medicine

## 2015-02-17 ENCOUNTER — Telehealth: Payer: Self-pay | Admitting: *Deleted

## 2015-02-17 ENCOUNTER — Ambulatory Visit: Payer: Medicaid Other | Admitting: Internal Medicine

## 2015-02-17 NOTE — Telephone Encounter (Signed)
phone not accepting calls

## 2015-02-22 ENCOUNTER — Emergency Department (HOSPITAL_COMMUNITY): Payer: Medicaid Other

## 2015-02-22 ENCOUNTER — Ambulatory Visit (HOSPITAL_COMMUNITY): Payer: Medicaid Other

## 2015-02-22 ENCOUNTER — Encounter (HOSPITAL_COMMUNITY): Payer: Self-pay | Admitting: *Deleted

## 2015-02-22 ENCOUNTER — Emergency Department (HOSPITAL_COMMUNITY)
Admission: EM | Admit: 2015-02-22 | Discharge: 2015-02-22 | Disposition: A | Discharge status: Home / Self Care | Payer: Medicaid Other | Attending: Emergency Medicine | Admitting: Emergency Medicine

## 2015-02-22 DIAGNOSIS — F17210 Nicotine dependence, cigarettes, uncomplicated: Secondary | ICD-10-CM | POA: Diagnosis not present

## 2015-02-22 DIAGNOSIS — Z79899 Other long term (current) drug therapy: Secondary | ICD-10-CM | POA: Insufficient documentation

## 2015-02-22 DIAGNOSIS — Z792 Long term (current) use of antibiotics: Secondary | ICD-10-CM | POA: Diagnosis not present

## 2015-02-22 DIAGNOSIS — R109 Unspecified abdominal pain: Secondary | ICD-10-CM | POA: Diagnosis present

## 2015-02-22 DIAGNOSIS — A5901 Trichomonal vulvovaginitis: Secondary | ICD-10-CM | POA: Insufficient documentation

## 2015-02-22 DIAGNOSIS — Z3202 Encounter for pregnancy test, result negative: Secondary | ICD-10-CM | POA: Insufficient documentation

## 2015-02-22 DIAGNOSIS — Z91040 Latex allergy status: Secondary | ICD-10-CM | POA: Diagnosis not present

## 2015-02-22 DIAGNOSIS — R112 Nausea with vomiting, unspecified: Secondary | ICD-10-CM

## 2015-02-22 DIAGNOSIS — R935 Abnormal findings on diagnostic imaging of other abdominal regions, including retroperitoneum: Secondary | ICD-10-CM

## 2015-02-22 DIAGNOSIS — R1030 Lower abdominal pain, unspecified: Secondary | ICD-10-CM

## 2015-02-22 DIAGNOSIS — N390 Urinary tract infection, site not specified: Secondary | ICD-10-CM | POA: Diagnosis not present

## 2015-02-22 LAB — URINALYSIS, ROUTINE W REFLEX MICROSCOPIC
Bilirubin Urine: NEGATIVE
Glucose, UA: NEGATIVE mg/dL
KETONES UR: NEGATIVE mg/dL
Nitrite: POSITIVE — AB
PROTEIN: 100 mg/dL — AB
Specific Gravity, Urine: 1.015 (ref 1.005–1.030)
pH: 7 (ref 5.0–8.0)

## 2015-02-22 LAB — CBC
HCT: 37.2 % (ref 36.0–46.0)
Hemoglobin: 12.2 g/dL (ref 12.0–15.0)
MCH: 30.2 pg (ref 26.0–34.0)
MCHC: 32.8 g/dL (ref 30.0–36.0)
MCV: 92.1 fL (ref 78.0–100.0)
PLATELETS: 192 10*3/uL (ref 150–400)
RBC: 4.04 MIL/uL (ref 3.87–5.11)
RDW: 13.3 % (ref 11.5–15.5)
WBC: 4.2 10*3/uL (ref 4.0–10.5)

## 2015-02-22 LAB — COMPREHENSIVE METABOLIC PANEL
ALBUMIN: 3.2 g/dL — AB (ref 3.5–5.0)
ALK PHOS: 72 U/L (ref 38–126)
ALT: 13 U/L — AB (ref 14–54)
AST: 27 U/L (ref 15–41)
Anion gap: 8 (ref 5–15)
BUN: 11 mg/dL (ref 6–20)
CALCIUM: 9.5 mg/dL (ref 8.9–10.3)
CO2: 22 mmol/L (ref 22–32)
CREATININE: 0.68 mg/dL (ref 0.44–1.00)
Chloride: 105 mmol/L (ref 101–111)
Glucose, Bld: 94 mg/dL (ref 65–99)
Potassium: 4.1 mmol/L (ref 3.5–5.1)
SODIUM: 135 mmol/L (ref 135–145)
Total Bilirubin: 0.2 mg/dL — ABNORMAL LOW (ref 0.3–1.2)
Total Protein: 10.7 g/dL — ABNORMAL HIGH (ref 6.5–8.1)

## 2015-02-22 LAB — URINE MICROSCOPIC-ADD ON

## 2015-02-22 LAB — PREGNANCY, URINE: Preg Test, Ur: NEGATIVE

## 2015-02-22 LAB — LIPASE, BLOOD: LIPASE: 27 U/L (ref 11–51)

## 2015-02-22 MED ORDER — DEXTROSE 5 % IV SOLN
1.0000 g | Freq: Once | INTRAVENOUS | Status: AC
  Administered 2015-02-22: 1 g via INTRAVENOUS
  Filled 2015-02-22: qty 10

## 2015-02-22 MED ORDER — ONDANSETRON HCL 4 MG/2ML IJ SOLN
4.0000 mg | Freq: Once | INTRAMUSCULAR | Status: AC
  Administered 2015-02-22: 4 mg via INTRAVENOUS
  Filled 2015-02-22: qty 2

## 2015-02-22 MED ORDER — TRAMADOL HCL 50 MG PO TABS: 50.0000 mg | ORAL_TABLET | Freq: Two times a day (BID) | ORAL | Status: DC

## 2015-02-22 MED ORDER — ONDANSETRON 4 MG PO TBDP
4.0000 mg | ORAL_TABLET | Freq: Once | ORAL | Status: AC
  Administered 2015-02-22: 4 mg via ORAL

## 2015-02-22 MED ORDER — OXYCODONE-ACETAMINOPHEN 5-325 MG PO TABS
1.0000 | ORAL_TABLET | Freq: Once | ORAL | Status: AC
  Administered 2015-02-22: 1 via ORAL

## 2015-02-22 MED ORDER — ONDANSETRON 4 MG PO TBDP
ORAL_TABLET | ORAL | Status: AC
  Filled 2015-02-22: qty 1

## 2015-02-22 MED ORDER — METRONIDAZOLE 500 MG PO TABS: 500.0000 mg | ORAL_TABLET | Freq: Two times a day (BID) | ORAL | Status: DC

## 2015-02-22 MED ORDER — TRAMADOL HCL 50 MG PO TABS
50.0000 mg | ORAL_TABLET | Freq: Once | ORAL | Status: AC
  Administered 2015-02-22: 50 mg via ORAL
  Filled 2015-02-22: qty 1

## 2015-02-22 MED ORDER — CEPHALEXIN 500 MG PO CAPS: 500.0000 mg | ORAL_CAPSULE | Freq: Four times a day (QID) | ORAL | Status: DC

## 2015-02-22 MED ORDER — IOHEXOL 300 MG/ML  SOLN
100.0000 mL | Freq: Once | INTRAMUSCULAR | Status: AC | PRN
  Administered 2015-02-22: 100 mL via INTRAVENOUS

## 2015-02-22 MED ORDER — ONDANSETRON HCL 4 MG PO TABS: 4.0000 mg | ORAL_TABLET | Freq: Four times a day (QID) | ORAL | Status: DC

## 2015-02-22 MED ORDER — OXYCODONE-ACETAMINOPHEN 5-325 MG PO TABS
ORAL_TABLET | ORAL | Status: AC
  Filled 2015-02-22: qty 1

## 2015-02-22 NOTE — ED Notes (Signed)
Pt states that she has had constant RLQ pain for 1 week worsening over the last 3 pt states that she has not had a BM in 1 week. Pt states that she has had N/V as well.

## 2015-02-22 NOTE — ED Provider Notes (Signed)
History  By signing my name below, I, Kara Allen, attest that this documentation has been prepared under the direction and in the presence of Kara Berninger, PA-C. Electronically Signed: Karle Allen, ED Scribe. 02/22/2015. 8:20 PM  Chief Complaint  Patient presents with  . Abdominal Pain   The history is provided by the patient and medical records. No language interpreter was used.    HPI Comments:  Kara Allen is a 35 y.o. HIV positive female who presents to the Emergency Department complaining of worsening, waxing and waning, "shifting" abdominal pain that began about three days ago. She reports a history of this pain after a left oophrectomy approximately one year ago. She states that she typically gets the pain around her menstrual cycle. She states she had "a weird period" this month and just went off her cycle. She states her pain is occasionally suprapubic and will then "shift" to the RLQ.  She has been evaluated by GYN for this complaint before and recommended to go on birth control pills. She states she did not want to do this and has intermittent pain since. Also reports nausea and vomiting. Pt states she cannot keep anything down for the past 3 days due to nausea. She reports associated constipation for the past week. Pt states she has had similar symptoms in the past due to her hemorrhoids. She was given Zofran and Percocet in the lobby which has significantly improved her symptoms. She denies modifying factors of her symptoms. She denies fever, chills, headache, dizziness, syncope, URI symptoms, chest pain, cough, SOB, diarrhea, dysuria, hematuria, flank pain, vaginal discharge or pelvic pain. She reports a history of frequent UTIs and pyelonephritis.   Chart review: patient seen in September 2016 with similar presentation. Diagnosed with abdominal pain, nausea, vomiting, pyelonephritis and trichomonas after negative abd/pelv CT scan. She is followed by Dr. Luciana Allen with ID  for her HIV. Most recent viral load 11k one month ago. She had visit with ID doctor and reports missing doses of her Rx. She reports medication compliance currently. Her PCP is Kara Patricia, NP who also follows her for her RLQ pain.  Past Medical History  Diagnosis Date  . HIV (human immunodeficiency virus infection) (HCC)   . Trichomonas infection   . Pyelonephritis   . Recurrent boils   . Urinary frequency     and nocturia  . Hydronephrosis, left   . Cyst of ovary     PT STATES SHE IS TAKING ANTIBIOTICS FOR CYST ON OVARY   Past Surgical History  Procedure Laterality Date  . Cesarean section  2006  . Cystoscopy with retrograde pyelogram, ureteroscopy and stent placement Bilateral 10/28/2013    Procedure: CYSTOSCOPY WITH RETROGRADE PYELOGRAM, CYSTOGRAM;  Surgeon: Valetta Fuller, MD;  Location: [facility];  Service: Urology;  Laterality: Bilateral;  . Laparoscopic unilateral salpingo oopherectomy Left 01/05/2014    Procedure: LAPAROSCOPIC LEFT SALPINGO OOPHORECTOMY;  Surgeon: Catalina Antigua, MD;  Location: [facility];  Service: Gynecology;  Laterality: Left;   Family History  Problem Relation Age of Onset  . HIV Mother    Social History  Substance Use Topics  . Smoking status: Current Every Day Smoker -- 0.10 packs/day for 14 years    Types: Cigarettes, Cigars  . Smokeless tobacco: Never Used     Comment: smoking 1-2 black and milds per day  . Alcohol Use: No     Comment:  - does smoke tobacco product called blackmilds   OB History    Gravida Para Term  Preterm AB TAB SAB Ectopic Multiple Living   2 1 1  1  1   1      Review of Systems  Gastrointestinal: Positive for nausea, vomiting, abdominal pain and constipation.  All other systems reviewed and are negative.   Allergies  Ibuprofen; Latex; Sulfamethoxazole-trimethoprim; and Tylenol  Home Medications   Prior to Admission medications   Medication Sig Start Date End Date Taking? Authorizing Provider  cephALEXin (KEFLEX) 500 MG  capsule Take 1 capsule (500 mg total) by mouth 4 (four) times daily. 02/22/15   Kara Londo, PA-C  EDURANT 25 MG TABS tablet TAKE ONE TABLET BY MOUTH EVERY MORNING WITH BREAKFAST 10/27/14   Kara Barefoot, MD  EPIVIR 10 MG/ML solution TAKE 30 MILLILITERS (2 TABLESPOONFULS) BY MOUTH ONCE DAILY 10/27/14   Kara Barefoot, MD  metroNIDAZOLE (FLAGYL) 500 MG tablet Take 1 tablet (500 mg total) by mouth 2 (two) times daily. 02/22/15   Kara Konczal, PA-C  ondansetron (ZOFRAN) 4 MG tablet Take 1 tablet (4 mg total) by mouth every 6 (six) hours. 02/22/15   Kara Norsworthy, PA-C  TIVICAY 50 MG tablet TAKE ONE (1) TABLET BY MOUTH EVERY DAY 10/27/14   Kara Barefoot, MD  traMADol (ULTRAM) 50 MG tablet Take 1 tablet (50 mg total) by mouth 2 (two) times daily. 02/22/15   Kara Allen Plain, PA-C   Triage Vitals: BP 127/87 mmHg  Pulse 77  Temp(Src) 98.1 F (36.7 C) (Oral)  Resp 20  Ht 5\' 1"  (1.549 m)  Wt 150 lb (68.04 kg)  BMI 28.36 kg/m2  SpO2 99%  LMP 02/08/2015 Physical Exam  Constitutional: She appears well-developed and well-nourished. No distress.  Nontoxic-appearing  HENT:  Head: Normocephalic and atraumatic.  Right Ear: External ear normal.  Left Ear: External ear normal.  Eyes: Conjunctivae are normal. Right eye exhibits no discharge. Left eye exhibits no discharge. No scleral icterus.  Neck: Normal range of motion. Neck supple.  Cardiovascular: Normal rate and normal heart sounds.   Pulmonary/Chest: Effort normal and breath sounds normal.  Abdominal: Soft. Bowel sounds are normal. There is tenderness in the right lower quadrant and suprapubic area. There is guarding (voluntary). There is no rigidity, no rebound and no CVA tenderness.  Musculoskeletal: Normal range of motion.  Moves all extremities spontaneously and walks with a steady gait  Neurological: She is alert. Coordination normal.  Skin: Skin is warm and dry.  Psychiatric: She has a normal mood and affect. Her behavior is normal.   Nursing note and vitals reviewed.   ED Course  Procedures (including critical care time) DIAGNOSTIC STUDIES: Oxygen Saturation is 99% on RA, normal by my interpretation.   COORDINATION OF CARE: 3:23 PM- Will CT abdomen. Pt verbalizes understanding and agrees to plan.  Medications  ondansetron (ZOFRAN-ODT) disintegrating tablet 4 mg (4 mg Oral Given 02/22/15 1035)  oxyCODONE-acetaminophen (PERCOCET/ROXICET) 5-325 MG per tablet 1 tablet (1 tablet Oral Given 02/22/15 1157)  traMADol (ULTRAM) tablet 50 mg (50 mg Oral Given 02/22/15 1640)  ondansetron (ZOFRAN) injection 4 mg (4 mg Intravenous Given 02/22/15 1802)  cefTRIAXone (ROCEPHIN) 1 g in dextrose 5 % 50 mL IVPB (0 g Intravenous Stopped 02/22/15 1900)  iohexol (OMNIPAQUE) 300 MG/ML solution 100 mL (100 mLs Intravenous Contrast Given 02/22/15 1816)   Labs Review Labs Reviewed  COMPREHENSIVE METABOLIC PANEL - Abnormal; Notable for the following:    Total Protein 10.7 (*)    Albumin 3.2 (*)    ALT 13 (*)    Total Bilirubin 0.2 (*)  All other components within normal limits  URINALYSIS, ROUTINE W REFLEX MICROSCOPIC (NOT AT [facility]) - Abnormal; Notable for the following:    APPearance TURBID (*)    Hgb urine dipstick LARGE (*)    Protein, ur 100 (*)    Nitrite POSITIVE (*)    Leukocytes, UA LARGE (*)    All other components within normal limits  URINE MICROSCOPIC-ADD ON - Abnormal; Notable for the following:    Squamous Epithelial / LPF 6-30 (*)    Bacteria, UA MANY (*)    All other components within normal limits  LIPASE, BLOOD  CBC  PREGNANCY, URINE    Imaging Review US Transvaginal Non-ob  02/22/2015  CLINICAL DATA:  Right lower quadrant pain, followup to CT scan EXAM: TRANSABDOMINAL AND TRANSVAGINAL ULTRASOUND OF PELVIS TECHNIQUE: Both transabdominal and transvaginal ultrasound examinations of the pelvis were performed. Transabdominal technique was performed for global imaging of the pelvis including uterus, ovaries, adnexal  regions, and pelvic cul-de-sac. It was necessary to proceed with endovaginal exam following the transabdominal exam to visualize the right ovary. COMPARISON:  02/22/2015 FINDINGS: Uterus Measurements: 8 x 3 x 3.5 cm. No fibroids or other mass visualized. Endometrium Thickness: 5 mm.  There is fluid within the endometrial canal. Right ovary Measurements: 57 x 29 x 36 mm. A simple appearing cyst measures 48 x 23 x 33 mm. Left ovary Surgically absent Other findings No abnormal free fluid. IMPRESSION: Simple cyst right ovary. Based on size and appearance no imaging follow-up is necessary. This recommendation follows the consensus statement: Management of Asymptomatic Ovarian and Other Adnexal Cysts Imaged at [place]: Society of Radiologists in Ultrasound Consensus Conference Statement. Radiology 2010; [phone]. Electronically Signed   By: Esperanza Heir M.D.   On: 02/22/2015 20:03   US Pelvis Complete  02/22/2015  CLINICAL DATA:  Right lower quadrant pain, followup to CT scan EXAM: TRANSABDOMINAL AND TRANSVAGINAL ULTRASOUND OF PELVIS TECHNIQUE: Both transabdominal and transvaginal ultrasound examinations of the pelvis were performed. Transabdominal technique was performed for global imaging of the pelvis including uterus, ovaries, adnexal regions, and pelvic cul-de-sac. It was necessary to proceed with endovaginal exam following the transabdominal exam to visualize the right ovary. COMPARISON:  02/22/2015 FINDINGS: Uterus Measurements: 8 x 3 x 3.5 cm. No fibroids or other mass visualized. Endometrium Thickness: 5 mm.  There is fluid within the endometrial canal. Right ovary Measurements: 57 x 29 x 36 mm. A simple appearing cyst measures 48 x 23 x 33 mm. Left ovary Surgically absent Other findings No abnormal free fluid. IMPRESSION: Simple cyst right ovary. Based on size and appearance no imaging follow-up is necessary. This recommendation follows the consensus statement: Management of Asymptomatic Ovarian and Other  Adnexal Cysts Imaged at [place]: Society of Radiologists in Ultrasound Consensus Conference Statement. Radiology 2010; [phone]. Electronically Signed   By: Esperanza Heir M.D.   On: 02/22/2015 20:03   Ct Abdomen Pelvis W Contrast  02/22/2015  CLINICAL DATA:  Constipation and epigastric pain for 1 week, HIV positive since birth, right lower quadrant pain EXAM: CT ABDOMEN AND PELVIS WITH CONTRAST TECHNIQUE: Multidetector CT imaging of the abdomen and pelvis was performed using the standard protocol following bolus administration of intravenous contrast. CONTRAST:  OMNIPAQUE IOHEXOL 300 MG/ML  SOLN COMPARISON:  09/30/2014 FINDINGS: Lower chest:  Negative Hepatobiliary: Scattered subcentimeter hepatic calcifications stable. Gallbladder normal. Pancreas: The pancreatic duct is again mildly prominent. This is stable. Pancreas is otherwise normal. Spleen: Normal Adrenals/Urinary Tract: Left hydronephrosis with cortical thinning, unchanged. Dilatation proximal  left ureter stable. Milder dilatation of right renal calices and dilatation of proximal right ureter also noted. Adrenal glands negative. Bladder negative. These findings are stable. Stomach/Bowel: Small bowel, stomach, appendix, and large bowel show no significant abnormalities. Nonobstructive bowel gas pattern. Vascular/Lymphatic: No significant abnormalities involving the aortoiliac vessels. Left retroperitoneal perinephric lymph node measuring about 23 x 18 mm mildly enlarged when compared to prior study but otherwise similar. Other prominent but smaller retroperitoneal lymph nodes are again noted. Reproductive: 29 x 31 x 60 mm primarily cystic lesion right adnexa noted, just cranial to the uterus, likely involving the right ovary. To approximately 1 cm oval foci of peripheral enhancement seen around the margins of this structure likely representing ovarian follicles. Other: No significant free fluid Musculoskeletal: No acute musculoskeletal findings  IMPRESSION: Numerous chronic findings including pancreatic duct dilatation and chronic changes involving both kidneys as well as retroperitoneal adenopathy, similar to prior studies with slightly larger dominant left retroperitoneal lymph node. 6 cm predominantly cystic right adnexal lesion likely arising within the right ovary. Would suggest further evaluation with pelvic ultrasound. This is significantly larger than on the prior study. Electronically Signed   By: Esperanza Heir M.D.   On: 02/22/2015 18:44   I have personally reviewed and evaluated these images and lab results as part of my medical decision-making.   EKG Interpretation None      MDM   Final diagnoses:  Lower abdominal pain  UTI (lower urinary tract infection)  Non-intractable vomiting with nausea, vomiting of unspecified type  Trichomonas vaginalis (TV) infection   35 year old HIV-positive female presenting with acute worsening of chronic, intermittent right lower quadrant pain. Associated symptoms include nausea, vomiting and constipation. She reports constipation is also chronic in nature. Vital signs stable. Patient is nontoxic-appearing in no acute distress. Mild abdominal tenderness in the suprapubic and right lower quadrant with voluntary guarding. No CVA tenderness. No elevated white count. Urinalysis positive for urinary tract infection and Trichomonas. Scheduled patient that we would need to perform a pelvic exam due to her lower abdominal pain and positive trichomonas on urine. Patient states that she does not feel that she needs a pelvic exam declines one at this time. CT abdomen shows 6 cm cystic right adnexal lesion which is enlarged from last CT exam. No other intra-abdominal abnormalities. Recommended pelvic ultrasound shows a simple right ovarian cyst. No further workup recommended. Reevaluated patient. She reports full resolution of symptoms after Zofran and Percocet. Given 1 dose of ceftriaxone for urinary tract  infection while in emergency department. Discussed diagnosis with patient who states understanding. Encouraged pt to take her home constipation medications again. Patient is to follow-up with her primary care provider in one week. Also encouraged patient to follow-up with GYN again for her chronic abdominal pain as it seems to be associated with her menstrual cycle as discussed before. Discussed safe sex practices and that she will must abstain from intercourse for the next 7 days and will need to have her partners tested for Trichomonas. Patient is in no acute distress before discharge and states understanding of the plan. All questions answered. Return precautions given in discharge paperwork and discussed with pt at bedside. Pt stable for discharge  I personally performed the services described in this documentation, which was scribed in my presence. The recorded information has been reviewed and is accurate.     Kara Gala Osbaldo Mark, PA-C 02/22/15 2138  Lyndal Pulley, MD 02/22/15 [phone]

## 2015-02-22 NOTE — Discharge Instructions (Signed)
Schedule a follow-up with your primary care provider for a visit in one week. You have been treated for Trichomonas. Abstain from sex for 7 days and have your partners tested for this as well. Take your antibiotics for your urinary tract infection as prescribed. Keep your scheduled follow-up appointments in the infectious disease doctor and continue taking HIV medications. Return to the emergency department with worsening abdominal pain, nausea not controlled with Zofran, fevers, or any other new, worsening or concerning symptoms   Abdominal Pain, Adult Many things can cause abdominal pain. Usually, abdominal pain is not caused by a disease and will improve without treatment. It can often be observed and treated at home. Your health care provider will do a physical exam and possibly order blood tests and X-rays to help determine the seriousness of your pain. However, in many cases, more time must pass before a clear cause of the pain can be found. Before that point, your health care provider may not know if you need more testing or further treatment. HOME CARE INSTRUCTIONS Monitor your abdominal pain for any changes. The following actions may help to alleviate any discomfort you are experiencing:  Only take over-the-counter or prescription medicines as directed by your health care provider.  Do not take laxatives unless directed to do so by your health care provider.  Try a clear liquid diet (broth, tea, or water) as directed by your health care provider. Slowly move to a bland diet as tolerated. SEEK MEDICAL CARE IF:  You have unexplained abdominal pain.  You have abdominal pain associated with nausea or diarrhea.  You have pain when you urinate or have a bowel movement.  You experience abdominal pain that wakes you in the night.  You have abdominal pain that is worsened or improved by eating food.  You have abdominal pain that is worsened with eating fatty foods.  You have a fever. SEEK  IMMEDIATE MEDICAL CARE IF:  Your pain does not go away within 2 hours.  You keep throwing up (vomiting).  Your pain is felt only in portions of the abdomen, such as the right side or the left lower portion of the abdomen.  You pass bloody or black tarry stools. MAKE SURE YOU:  Understand these instructions.  Will watch your condition.  Will get help right away if you are not doing well or get worse.   This information is not intended to replace advice given to you by your health care provider. Make sure you discuss any questions you have with your health care provider.   Document Released: 09/27/2004 Document Revised: 09/08/2014 Document Reviewed: 08/27/2012 Elsevier Interactive Patient Education 2016 Elsevier Inc.  Nausea and Vomiting Nausea is a sick feeling that often comes before throwing up (vomiting). Vomiting is a reflex where stomach contents come out of your mouth. Vomiting can cause severe loss of body fluids (dehydration). Children and elderly adults can become dehydrated quickly, especially if they also have diarrhea. Nausea and vomiting are symptoms of a condition or disease. It is important to find the cause of your symptoms. CAUSES   Direct irritation of the stomach lining. This irritation can result from increased acid production (gastroesophageal reflux disease), infection, food poisoning, taking certain medicines (such as nonsteroidal anti-inflammatory drugs), alcohol use, or tobacco use.  Signals from the brain.These signals could be caused by a headache, heat exposure, an inner ear disturbance, increased pressure in the brain from injury, infection, a tumor, or a concussion, pain, emotional stimulus, or metabolic problems.  An obstruction in the gastrointestinal tract (bowel obstruction).  Illnesses such as diabetes, hepatitis, gallbladder problems, appendicitis, kidney problems, cancer, sepsis, atypical symptoms of a heart attack, or eating disorders.  Medical  treatments such as chemotherapy and radiation.  Receiving medicine that makes you sleep (general anesthetic) during surgery. DIAGNOSIS Your caregiver may ask for tests to be done if the problems do not improve after a few days. Tests may also be done if symptoms are severe or if the reason for the nausea and vomiting is not clear. Tests may include:  Urine tests.  Blood tests.  Stool tests.  Cultures (to look for evidence of infection).  X-rays or other imaging studies. Test results can help your caregiver make decisions about treatment or the need for additional tests. TREATMENT You need to stay well hydrated. Drink frequently but in small amounts.You may wish to drink water, sports drinks, clear broth, or eat frozen ice pops or gelatin dessert to help stay hydrated.When you eat, eating slowly may help prevent nausea.There are also some antinausea medicines that may help prevent nausea. HOME CARE INSTRUCTIONS   Take all medicine as directed by your caregiver.  If you do not have an appetite, do not force yourself to eat. However, you must continue to drink fluids.  If you have an appetite, eat a normal diet unless your caregiver tells you differently.  Eat a variety of complex carbohydrates (rice, wheat, potatoes, bread), lean meats, yogurt, fruits, and vegetables.  Avoid high-fat foods because they are more difficult to digest.  Drink enough water and fluids to keep your urine clear or pale yellow.  If you are dehydrated, ask your caregiver for specific rehydration instructions. Signs of dehydration may include:  Severe thirst.  Dry lips and mouth.  Dizziness.  Dark urine.  Decreasing urine frequency and amount.  Confusion.  Rapid breathing or pulse. SEEK IMMEDIATE MEDICAL CARE IF:   You have blood or brown flecks (like coffee grounds) in your vomit.  You have black or bloody stools.  You have a severe headache or stiff neck.  You are confused.  You have  severe abdominal pain.  You have chest pain or trouble breathing.  You do not urinate at least once every 8 hours.  You develop cold or clammy skin.  You continue to vomit for longer than 24 to 48 hours.  You have a fever. MAKE SURE YOU:   Understand these instructions.  Will watch your condition.  Will get help right away if you are not doing well or get worse.   This information is not intended to replace advice given to you by your health care provider. Make sure you discuss any questions you have with your health care provider.   Document Released: 12/18/2004 Document Revised: 03/12/2011 Document Reviewed: 05/17/2010 Elsevier Interactive Patient Education 2016 Elsevier Inc.  Urinary Tract Infection Urinary tract infections (UTIs) can develop anywhere along your urinary tract. Your urinary tract is your body's drainage system for removing wastes and extra water. Your urinary tract includes two kidneys, two ureters, a bladder, and a urethra. Your kidneys are a pair of bean-shaped organs. Each kidney is about the size of your fist. They are located below your ribs, one on each side of your spine. CAUSES Infections are caused by microbes, which are microscopic organisms, including fungi, viruses, and bacteria. These organisms are so small that they can only be seen through a microscope. Bacteria are the microbes that most commonly cause UTIs. SYMPTOMS  Symptoms of  UTIs may vary by age and gender of the patient and by the location of the infection. Symptoms in young women typically include a frequent and intense urge to urinate and a painful, burning feeling in the bladder or urethra during urination. Older women and men are more likely to be tired, shaky, and weak and have muscle aches and abdominal pain. A fever may mean the infection is in your kidneys. Other symptoms of a kidney infection include pain in your back or sides below the ribs, nausea, and vomiting. DIAGNOSIS To diagnose  a UTI, your caregiver will ask you about your symptoms. Your caregiver will also ask you to provide a urine sample. The urine sample will be tested for bacteria and white blood cells. White blood cells are made by your body to help fight infection. TREATMENT  Typically, UTIs can be treated with medication. Because most UTIs are caused by a bacterial infection, they usually can be treated with the use of antibiotics. The choice of antibiotic and length of treatment depend on your symptoms and the type of bacteria causing your infection. HOME CARE INSTRUCTIONS  If you were prescribed antibiotics, take them exactly as your caregiver instructs you. Finish the medication even if you feel better after you have only taken some of the medication.  Drink enough water and fluids to keep your urine clear or pale yellow.  Avoid caffeine, tea, and carbonated beverages. They tend to irritate your bladder.  Empty your bladder often. Avoid holding urine for long periods of time.  Empty your bladder before and after sexual intercourse.  After a bowel movement, women should cleanse from front to back. Use each tissue only once. SEEK MEDICAL CARE IF:   You have back pain.  You develop a fever.  Your symptoms do not begin to resolve within 3 days. SEEK IMMEDIATE MEDICAL CARE IF:   You have severe back pain or lower abdominal pain.  You develop chills.  You have nausea or vomiting.  You have continued burning or discomfort with urination. MAKE SURE YOU:   Understand these instructions.  Will watch your condition.  Will get help right away if you are not doing well or get worse.   This information is not intended to replace advice given to you by your health care provider. Make sure you discuss any questions you have with your health care provider.   Document Released: 09/27/2004 Document Revised: 09/08/2014 Document Reviewed: 01/26/2011 Elsevier Interactive Patient Education 2016 [employer].  Trichomoniasis Trichomoniasis is an infection caused by an organism called Trichomonas. The infection can affect both women and men. In women, the outer female genitalia and the vagina are affected. In men, the penis is mainly affected, but the prostate and other reproductive organs can also be involved. Trichomoniasis is a sexually transmitted infection (STI) and is most often passed to another person through sexual contact.  RISK FACTORS  Having unprotected sexual intercourse.  Having sexual intercourse with an infected partner. SIGNS AND SYMPTOMS  Symptoms of trichomoniasis in women include:  Abnormal gray-green frothy vaginal discharge.  Itching and irritation of the vagina.  Itching and irritation of the area outside the vagina. Symptoms of trichomoniasis in men include:   Penile discharge with or without pain.  Pain during urination. This results from inflammation of the urethra. DIAGNOSIS  Trichomoniasis may be found during a Pap test or physical exam. Your health care provider may use one of the following methods to help diagnose this infection:  Testing the  pH of the vagina with a test tape.  Using a vaginal swab test that checks for the Trichomonas organism. A test is available that provides results within a few minutes.  Examining a urine sample.  Testing vaginal secretions. Your health care provider may test you for other STIs, including HIV. TREATMENT   You may be given medicine to fight the infection. Women should inform their health care provider if they could be or are pregnant. Some medicines used to treat the infection should not be taken during pregnancy.  Your health care provider may recommend over-the-counter medicines or creams to decrease itching or irritation.  Your sexual partner will need to be treated if infected.  Your health care provider may test you for infection again 3 months after treatment. HOME CARE INSTRUCTIONS   Take medicines  only as directed by your health care provider.  Take over-the-counter medicine for itching or irritation as directed by your health care provider.  Do not have sexual intercourse while you have the infection.  Women should not douche or wear tampons while they have the infection.  Discuss your infection with your partner. Your partner may have gotten the infection from you, or you may have gotten it from your partner.  Have your sex partner get examined and treated if necessary.  Practice safe, informed, and protected sex.  See your health care provider for other STI testing. SEEK MEDICAL CARE IF:   You still have symptoms after you finish your medicine.  You develop abdominal pain.  You have pain when you urinate.  You have bleeding after sexual intercourse.  You develop a rash.  Your medicine makes you sick or makes you throw up (vomit). MAKE SURE YOU:  Understand these instructions.  Will watch your condition.  Will get help right away if you are not doing well or get worse.   This information is not intended to replace advice given to you by your health care provider. Make sure you discuss any questions you have with your health care provider.   Document Released: 06/13/2000 Document Revised: 01/08/2014 Document Reviewed: 09/29/2012 Elsevier Interactive Patient Education [employer]

## 2015-02-22 NOTE — ED Notes (Signed)
Pt alert and continues to wait for a room

## 2015-02-22 NOTE — ED Notes (Signed)
Patient transported to CT 

## 2015-03-29 ENCOUNTER — Ambulatory Visit: Payer: Medicaid Other | Admitting: Internal Medicine

## 2015-03-30 ENCOUNTER — Other Ambulatory Visit: Payer: Self-pay | Admitting: Internal Medicine

## 2015-05-17 ENCOUNTER — Other Ambulatory Visit: Payer: Self-pay | Admitting: Internal Medicine

## 2015-05-17 DIAGNOSIS — Z91199 Patient's noncompliance with other medical treatment and regimen due to unspecified reason: Secondary | ICD-10-CM

## 2015-05-17 DIAGNOSIS — Z9111 Patient's noncompliance with dietary regimen: Secondary | ICD-10-CM

## 2015-05-20 ENCOUNTER — Telehealth: Payer: Self-pay | Admitting: *Deleted

## 2015-05-20 NOTE — Telephone Encounter (Signed)
RN received a referral for Kara [facility]. The focus of the visit with be around medication adherence and to identify/address any barriers that may be keeping Kara Allen from taking her medications. RN did not receive a answer and vm did not state a name. RN left a message stating " I may trying to get in contact with Ms. [name] Allen, this is Lind CovertAmbre McNeil and I am a nurse with your Dr's office. We would like to know how you are doing.Marland Kitchen.Marland Kitchen.Marland Kitchen.Marland Kitchen.please return my call at [phone]"

## 2015-06-28 ENCOUNTER — Telehealth: Payer: Self-pay | Admitting: *Deleted

## 2015-06-28 NOTE — Telephone Encounter (Signed)
Thanks for reaching out to Seneca GardensSharon.

## 2015-06-28 NOTE — Telephone Encounter (Addendum)
RN contacted the patient again to f/u on referral received from Dr Orvan [facility]ampbell [facility]. RN was able to speak with the patient.  After explaining the reason for my call and the services that I can provide to her, [name] stated she has no trouble taking her medications. [name] stated [facility] continues to deliver her medications on schedule and she continues to take the medication each day. 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 RN acknowledged all of the great work that [name] has done and let her know that we would love to get her off the viral load list. RN explained to [name] that since we have not seen her in a while we only have her results from January that show she was detectable. RN encouraged [name] by stating she needs to show all of her great progress with taking her medications each day by having her lab work completed. [name] stated she does have some days of missing her medication but mostly takes her medication each day. RN verbalized a understanding without stating the obvious(that she should be taking her medication each day) [name] agreed to scheduling a lab appt. RN made a lab appt for the patient but [name] stated she would like to wait on making an appt with Dr Orvan Falconerampbell. [name] does not feel that she needs my services at this time but allowed me to text her my name and my number so she can stay in contact with me.  RN spoke with the patient over the phone for over 20 minutes

## 2015-07-06 ENCOUNTER — Other Ambulatory Visit: Payer: Medicaid Other

## 2015-07-18 ENCOUNTER — Other Ambulatory Visit: Payer: Medicaid Other

## 2015-07-18 DIAGNOSIS — B20 Human immunodeficiency virus [HIV] disease: Secondary | ICD-10-CM

## 2015-07-19 LAB — T-HELPER CELL (CD4) - (RCID CLINIC ONLY)
CD4 % Helper T Cell: 12 % — ABNORMAL LOW (ref 33–55)
CD4 T CELL ABS: 100 /uL — AB (ref 400–2700)

## 2015-07-19 LAB — HIV-1 RNA ULTRAQUANT REFLEX TO GENTYP+
HIV 1 RNA Quant: 29 copies/mL — ABNORMAL HIGH (ref <20)
HIV-1 RNA Quant, Log: 1.46 Log copies/mL — ABNORMAL HIGH (ref <1.30)

## 2015-07-25 LAB — HIV-1 INTEGRASE GENOTYPE

## 2015-08-01 ENCOUNTER — Other Ambulatory Visit: Payer: Medicaid Other

## 2015-08-03 ENCOUNTER — Encounter: Payer: Self-pay | Admitting: Internal Medicine

## 2015-08-03 ENCOUNTER — Ambulatory Visit (INDEPENDENT_AMBULATORY_CARE_PROVIDER_SITE_OTHER): Payer: Medicaid Other | Admitting: Internal Medicine

## 2015-08-03 ENCOUNTER — Ambulatory Visit: Payer: Medicaid Other | Admitting: *Deleted

## 2015-08-03 DIAGNOSIS — Z21 Asymptomatic human immunodeficiency virus [HIV] infection status: Secondary | ICD-10-CM

## 2015-08-03 DIAGNOSIS — F329 Major depressive disorder, single episode, unspecified: Secondary | ICD-10-CM | POA: Diagnosis not present

## 2015-08-03 DIAGNOSIS — F32A Depression, unspecified: Secondary | ICD-10-CM

## 2015-08-03 DIAGNOSIS — R102 Pelvic and perineal pain: Secondary | ICD-10-CM

## 2015-08-03 DIAGNOSIS — B20 Human immunodeficiency virus [HIV] disease: Secondary | ICD-10-CM

## 2015-08-03 MED ORDER — ONDANSETRON HCL 4 MG PO TABS: 4.0000 mg | ORAL_TABLET | Freq: Four times a day (QID) | ORAL | 5 refills | Status: DC

## 2015-08-03 MED ORDER — DAPSONE 100 MG PO TABS: 100.0000 mg | ORAL_TABLET | Freq: Every day | ORAL | 11 refills | Status: DC

## 2015-08-03 NOTE — Assessment & Plan Note (Signed)
Previously seen by OB/GYN; however, patient was uncomfortable with treatment options presented to her.  Spoke with patient that IUD, Implanon, or hysterectomy may relief her symptoms and would like her to be seen by [facility].  Patient agrees to referral.

## 2015-08-03 NOTE — Progress Notes (Signed)
Subjective:    Patient ID: Kara Allen, female    DOB: [birth year], 35 y.o.   MRN: [id number]  HPI  Kara Allen is a 35 year old female with a history of HIV.  Her ART regimen consists of Epivir, Tivicay, and Edurant.  Patient able to identify medications off of chart.  She denies any side effects from her antiretroviral medications.  However, she does report 2 missed doses in the past two weeks.  She states that forgetfulness and late dinners were the cause of her missed doses.  Although Dr. Orvan Falconer assisted patient in setting up phone alarms as reminders, she has now switched to taking her medication in the evening after dinner.  Patient seen in clinic for routine follow-up with complaints of dysmenorrhea.  Patient previously seen by OB/GYN who discussed birth control options.  However, patient was uncertain about IUD and patch had drug-interactions with her ART (as per patient).  Review of Systems  Constitutional: Negative for chills and fever.  HENT: Negative for mouth sores, sore throat and trouble swallowing.   Respiratory: Negative for cough, chest tightness and shortness of breath.   Cardiovascular: Negative for chest pain.  Gastrointestinal: Positive for nausea (episodic during her menstrual cycle) and vomiting (during menstural cycle). Negative for constipation and diarrhea.  Genitourinary: Positive for menstrual problem (cramping, nausea, excessive bleeding during menstrual cycle) and vaginal bleeding.  Skin: Negative for rash.  Neurological: Negative for dizziness and headaches.      Objective:   Physical Exam  Constitutional: She is oriented to person, place, and time. She appears well-developed and well-nourished.  HENT:  Mouth/Throat: Oropharynx is clear and moist. No oropharyngeal exudate.  Eyes: Pupils are equal, round, and reactive to light.  Cardiovascular: Normal rate, regular rhythm and normal heart sounds.   No murmur heard. Pulmonary/Chest: Breath  sounds normal. No respiratory distress. She has no wheezes.  Abdominal: Soft. Bowel sounds are normal. She exhibits no distension. There is no tenderness.  Lymphadenopathy:    She has cervical adenopathy.  Neurological: She is alert and oriented to person, place, and time.  Skin: Skin is warm and dry.  Psychiatric: She has a normal mood and affect. Her behavior is normal. Judgment and thought content normal.      Assessment & Plan:  HIV (human immunodeficiency virus infection) (HCC) Patient's HIV under better control as her adherence has improved.  Patient reports 2 missed doses in the past 2 weeks related to forgetfulness/late dinners.  Assisted patient in setting phone alarm.  Alarm scheduled to ring daily at 6 pm.  Additionally, spoke with patient about importance of compliance.  Will send prescription for Zofran for episodic nausea/vomiting during menstrual cycles.  Patient to follow-up in 3 months.  Depression Patient reports feeling well.  Spoke about the summer activities she has shared with her son.  Unfortunately, severe bleeding, cramping, pain, and nausea during menstrual cycles have effects on patient's mood.  She is coping well.  She was receptive to talking to [place], Naval architect.  Additionally, she was receptive to referral to [facility] for further evaluation/treatment of her dysmenorrhea.  Pelvic pain in female Previously seen by OB/GYN; however, patient was uncomfortable with treatment options presented to her.  Spoke with patient that IUD, Implanon, or hysterectomy may relief her symptoms and would like her to be seen by [facility].  Patient agrees to referral.   Addendum: I have seen and examined Kara Allen with Pershing Cox and agree with his assessment and  plans. Kara Allen is doing better. Her adherence has improved and has resulted her viral load is suppressing nicely. I will need to start her on dapsone for pneumocystis prophylaxis. Her mood is better and she is not  as depressed. She is still having a great deal of difficulty with heavy menses and pain associated with nausea and vomiting. We will refer her back to [facility] for further GYN evaluation. I have also refilled her Zofran to take as needed for nausea. She will follow-up after lab work in 3 months.  Cliffton Asters, MD [facility] [phone] pager   [phone] cell 08/03/2015, 5:34 PM

## 2015-08-03 NOTE — Assessment & Plan Note (Addendum)
Patient reports feeling well.  Spoke about the summer activities she has shared with her son.  Unfortunately, severe bleeding, cramping, pain, and nausea during menstrual cycles have effects on patient's mood.  She is coping well.  She was receptive to talking to [place], Naval architect.  Additionally, she was receptive to referral to [facility] for further evaluation/treatment of her dysmenorrhea.

## 2015-08-03 NOTE — BH Specialist Note (Signed)
Counselor met with Kara Allen today in the exam room for a warm hand off.  Patient was oriented times four with good affect and dress.  Patient was alert and talkative.  Patient shared that she is so grateful for the care that [facility] and specifically Dr. Megan Salon has showed her.  Patient stated that she has had a rough life being born with HIV but was in a pretty good place at this time.  Patient indicated that she is engaged and will probably be getting married soon.  Patient said that her mood goes up and down like anyone else but feels hers is exaggerated as a result of her menstrual cycle. Patient said that she has a lot of pain and bleeding with her menstrual cycles. Counselor offered support and encouragement accordingly.  Counselor provided patient a business card and recommended that she make an appointment with counselor if and when there was a need.  Patient agree to do so.    Rolena Infante, MA, LPC [facility]

## 2015-08-03 NOTE — Assessment & Plan Note (Signed)
Patient's HIV under better control as her adherence has improved.  Patient reports 2 missed doses in the past 2 weeks related to forgetfulness/late dinners.  Assisted patient in setting phone alarm.  Alarm scheduled to ring daily at 6 pm.  Additionally, spoke with patient about importance of compliance.  Will send prescription for Zofran for episodic nausea/vomiting during menstrual cycles.  Patient to follow-up in 3 months.

## 2015-08-14 ENCOUNTER — Emergency Department (HOSPITAL_COMMUNITY)
Admission: EM | Admit: 2015-08-14 | Discharge: 2015-08-14 | Disposition: A | Discharge status: Home / Self Care | Payer: Medicaid Other | Attending: Emergency Medicine | Admitting: Emergency Medicine

## 2015-08-14 ENCOUNTER — Encounter (HOSPITAL_COMMUNITY): Payer: Self-pay | Admitting: Emergency Medicine

## 2015-08-14 DIAGNOSIS — S6992XA Unspecified injury of left wrist, hand and finger(s), initial encounter: Secondary | ICD-10-CM | POA: Insufficient documentation

## 2015-08-14 DIAGNOSIS — F17210 Nicotine dependence, cigarettes, uncomplicated: Secondary | ICD-10-CM | POA: Diagnosis not present

## 2015-08-14 DIAGNOSIS — Y999 Unspecified external cause status: Secondary | ICD-10-CM | POA: Diagnosis not present

## 2015-08-14 DIAGNOSIS — Y929 Unspecified place or not applicable: Secondary | ICD-10-CM | POA: Insufficient documentation

## 2015-08-14 DIAGNOSIS — Z21 Asymptomatic human immunodeficiency virus [HIV] infection status: Secondary | ICD-10-CM | POA: Diagnosis not present

## 2015-08-14 DIAGNOSIS — Z91040 Latex allergy status: Secondary | ICD-10-CM | POA: Insufficient documentation

## 2015-08-14 DIAGNOSIS — W231XXA Caught, crushed, jammed, or pinched between stationary objects, initial encounter: Secondary | ICD-10-CM | POA: Diagnosis not present

## 2015-08-14 DIAGNOSIS — Y939 Activity, unspecified: Secondary | ICD-10-CM | POA: Diagnosis not present

## 2015-08-14 MED ORDER — CEPHALEXIN 250 MG PO CAPS
500.0000 mg | ORAL_CAPSULE | Freq: Once | ORAL | Status: AC
  Administered 2015-08-14: 500 mg via ORAL
  Filled 2015-08-14: qty 2

## 2015-08-14 MED ORDER — CEPHALEXIN 500 MG PO CAPS: 500.0000 mg | ORAL_CAPSULE | Freq: Four times a day (QID) | ORAL | 0 refills | Status: DC

## 2015-08-14 NOTE — ED Triage Notes (Addendum)
Clipped fingernail on ring finger of left hand too far down two days ago. Nail is throbbing and hot to touch. She poked it with a needle and pus drained out.

## 2015-08-14 NOTE — ED Provider Notes (Signed)
[facility]-EMERGENCY DEPT Provider Note   CSN: [id number] Arrival date & time: 08/14/15  2205  First Provider Contact:  First MD Initiated Contact with Patient 08/14/15 2216     By signing my name below, I, Modena JanskyAlbert Thayil, attest that this documentation has been prepared under the direction and in the presence of non-physician practitioner, Roxy Horsemanobert Kelicia Youtz, PA-C. Electronically Signed: Modena JanskyAlbert Thayil, Scribe. 08/14/2015. 10:26 PM..   History   Chief Complaint Chief Complaint  Patient presents with  . Finger Injury    The history is provided by the patient. No language interpreter was used.   HPI Comments: Kara Allen is a 35 y.o. female who presents to the Emergency Department complaining of left ring finger injury that occurred two days ago. She reports that she clipped her fingernail too far, then noticed aching pain and swelling in the affected area. She reports that she attempted to drain the area PTA and pus came out. She reports that her tetanus status is UTD.     She states that she has a medication allergy to Septra.   Past Medical History:  Diagnosis Date  . Cyst of ovary    PT STATES SHE IS TAKING ANTIBIOTICS FOR CYST ON OVARY  . HIV (human immunodeficiency virus infection) (HCC)   . Hydronephrosis, left   . Pyelonephritis   . Recurrent boils   . Trichomonas infection   . Urinary frequency    and nocturia    Patient Active Problem List   Diagnosis Date Noted  . Gum inflammation 06/02/2014  . Pelvic pain in female   . Left ovarian cyst   . Back pain   . 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 HIV (human immunodeficiency virus infection) (HCC) 10/01/2013  . Hydronephrosis, left 10/01/2013  . BOILS, RECURRENT 02/12/2008  . ANXIETY 01/30/2006  . CIGARETTE SMOKER 01/30/2006  . Depression 01/30/2006  . Mild cognitive impairment 01/30/2006  . ALLERGIC RHINITIS 01/30/2006  . SYMPTOM, ENURESIS, NOCTURNAL 01/30/2006    Past Surgical History:  Procedure Laterality  Date  . CESAREAN SECTION  2006  . CYSTOSCOPY WITH RETROGRADE PYELOGRAM, URETEROSCOPY AND STENT PLACEMENT Bilateral 10/28/2013   Procedure: CYSTOSCOPY WITH RETROGRADE PYELOGRAM, CYSTOGRAM;  Surgeon: Valetta Fulleravid S Grapey, MD;  Location: [facility];  Service: Urology;  Laterality: Bilateral;  . LAPAROSCOPIC UNILATERAL SALPINGO OOPHERECTOMY Left 01/05/2014   Procedure: LAPAROSCOPIC LEFT SALPINGO OOPHORECTOMY;  Surgeon: Catalina AntiguaPeggy Constant, MD;  Location: [facility];  Service: Gynecology;  Laterality: Left;    OB History    Gravida Para Term Preterm AB Living   2 1 1   1 1    SAB TAB Ectopic Multiple Live Births   1               Home Medications    Prior to Admission medications   Medication Sig Start Date End Date Taking? Authorizing Provider  dapsone 100 MG tablet Take 1 tablet (100 mg total) by mouth daily. 08/03/15   Cliffton AstersJohn Campbell, MD  EDURANT 25 MG TABS tablet TAKE ONE TABLET BY MOUTH EVERY MORNING WITH BREAKFAST 03/30/15   Cliffton AstersJohn Campbell, MD  EPIVIR 10 MG/ML solution TAKE 30 MILLILITERS (2 TABLESPOONFULS) BY MOUTH ONCE DAILY 03/30/15   Cliffton AstersJohn Campbell, MD  metroNIDAZOLE (FLAGYL) 500 MG tablet Take 1 tablet (500 mg total) by mouth 2 (two) times daily. Patient not taking: Reported on 08/03/2015 02/22/15   Rolm GalaStevi Barrett, PA-C  ondansetron (ZOFRAN) 4 MG tablet Take 1 tablet (4 mg total) by mouth every 6 (six) hours. 08/03/15   Jonny RuizJohn  Orvan Falconer, MD  TIVICAY 50 MG tablet TAKE ONE (1) TABLET BY MOUTH EVERY DAY 03/30/15   Cliffton Asters, MD  traMADol (ULTRAM) 50 MG tablet Take 1 tablet (50 mg total) by mouth 2 (two) times daily. 02/22/15   Rolm Gala Barrett, PA-C    Family History Family History  Problem Relation Age of Onset  . HIV Mother     Social History Social History  Substance Use Topics  . Smoking status: Current Every Day Smoker    Packs/day: 0.10    Years: 14.00    Types: Cigarettes, Cigars  . Smokeless tobacco: Never Used     Comment: smoking 1-2 black and milds per day  . Alcohol use No     Comment:  - does  smoke tobacco product called blackmilds     Allergies   Ibuprofen; Latex; Sulfamethoxazole-trimethoprim; and Tylenol [acetaminophen]   Review of Systems Review of Systems  Skin: Positive for wound.  All other systems reviewed and are negative.    Physical Exam Updated Vital Signs BP 109/81 (BP Location: Left Arm)   Pulse 86   Temp 97.9 F (36.6 C) (Oral)   Resp 18   Wt 155 lb 9 oz (70.6 kg)   LMP 07/29/2015 (Exact Date)   SpO2 100%   BMI 29.39 kg/m   Physical Exam Physical Exam  Constitutional: Pt appears well-developed and well-nourished. No distress.  HENT:  Head: Normocephalic and atraumatic.  Eyes: Conjunctivae are normal.  Neck: Normal range of motion.  Cardiovascular: Normal rate, regular rhythm and intact distal pulses.   Capillary refill < 3 sec  Pulmonary/Chest: Effort normal and breath sounds normal.  Musculoskeletal: Pt exhibits tenderness to palpation of left ring finger distally. Pt exhibits no edema.  ROM: 5/5  Neurological: Pt  is alert. Coordination normal.  Sensation 5/5 Strength 5/5  Skin: Skin is warm and dry. Pt is not diaphoretic.  No tenting of the skin  Prior paronychia, no active discharge Psychiatric: Pt has a normal mood and affect.  Nursing note and vitals reviewed.   ED Treatments / Results  DIAGNOSTIC STUDIES: Oxygen Saturation is 100% on RA, normal by my interpretation.    COORDINATION OF CARE: 10:28 PM- Pt advised of plan for treatment, which includes keflex medication, and pt agrees.   Labs (all labs ordered are listed, but only abnormal results are displayed) Labs Reviewed - No data to display  EKG  EKG Interpretation None       Radiology No results found.  Procedures Procedures (including critical care time)  Medications Ordered in ED Medications - No data to display   Initial Impression / Assessment and Plan / ED Course  I have reviewed the triage vital signs and the nursing notes.  Pertinent labs &  imaging results that were available during my care of the patient were reviewed by me and considered in my medical decision making (see chart for details).  Clinical Course    Patient with paronychia, which she drained with a needle today.  No more pus able to be expressed.  DC to home with keflex.  Final Clinical Impressions(s) / ED Diagnoses   Final diagnoses:  Finger injury, left, initial encounter    New Prescriptions New Prescriptions   CEPHALEXIN (KEFLEX) 500 MG CAPSULE    Take 1 capsule (500 mg total) by mouth 4 (four) times daily.   I personally performed the services described in this documentation, which was scribed in my presence. The recorded information has been reviewed and is  accurate.       Roxy Horseman, PA-C 08/14/15 2237    Doug Sou, MD 08/14/15 [phone]

## 2015-09-07 ENCOUNTER — Other Ambulatory Visit: Payer: Self-pay | Admitting: Internal Medicine

## 2015-09-19 ENCOUNTER — Other Ambulatory Visit: Payer: Self-pay | Admitting: *Deleted

## 2015-09-19 DIAGNOSIS — R102 Pelvic and perineal pain: Secondary | ICD-10-CM

## 2015-11-02 DIAGNOSIS — R569 Unspecified convulsions: Secondary | ICD-10-CM

## 2015-11-02 HISTORY — DX: Unspecified convulsions: R56.9

## 2015-11-12 ENCOUNTER — Encounter (HOSPITAL_COMMUNITY): Payer: Self-pay | Admitting: Emergency Medicine

## 2015-11-12 ENCOUNTER — Emergency Department (HOSPITAL_COMMUNITY)
Admission: EM | Admit: 2015-11-12 | Discharge: 2015-11-12 | Disposition: A | Discharge status: Home / Self Care | Payer: Medicaid Other | Attending: Emergency Medicine | Admitting: Emergency Medicine

## 2015-11-12 DIAGNOSIS — Z79899 Other long term (current) drug therapy: Secondary | ICD-10-CM | POA: Insufficient documentation

## 2015-11-12 DIAGNOSIS — F17210 Nicotine dependence, cigarettes, uncomplicated: Secondary | ICD-10-CM | POA: Insufficient documentation

## 2015-11-12 DIAGNOSIS — N3001 Acute cystitis with hematuria: Secondary | ICD-10-CM

## 2015-11-12 DIAGNOSIS — R1084 Generalized abdominal pain: Secondary | ICD-10-CM

## 2015-11-12 DIAGNOSIS — Z91040 Latex allergy status: Secondary | ICD-10-CM | POA: Diagnosis not present

## 2015-11-12 DIAGNOSIS — R110 Nausea: Secondary | ICD-10-CM

## 2015-11-12 DIAGNOSIS — R1030 Lower abdominal pain, unspecified: Secondary | ICD-10-CM | POA: Diagnosis present

## 2015-11-12 LAB — URINALYSIS, ROUTINE W REFLEX MICROSCOPIC
Bilirubin Urine: NEGATIVE
Glucose, UA: NEGATIVE mg/dL
Ketones, ur: NEGATIVE mg/dL
NITRITE: NEGATIVE
PH: 7 (ref 5.0–8.0)
Protein, ur: 100 mg/dL — AB
SPECIFIC GRAVITY, URINE: 1.016 (ref 1.005–1.030)

## 2015-11-12 LAB — CBC WITH DIFFERENTIAL/PLATELET
BASOS ABS: 0 10*3/uL (ref 0.0–0.1)
BASOS PCT: 0 %
Eosinophils Absolute: 0.1 10*3/uL (ref 0.0–0.7)
Eosinophils Relative: 2 %
HEMATOCRIT: 34.2 % — AB (ref 36.0–46.0)
Hemoglobin: 11.4 g/dL — ABNORMAL LOW (ref 12.0–15.0)
LYMPHS PCT: 16 %
Lymphs Abs: 0.7 10*3/uL (ref 0.7–4.0)
MCH: 30.6 pg (ref 26.0–34.0)
MCHC: 33.3 g/dL (ref 30.0–36.0)
MCV: 91.7 fL (ref 78.0–100.0)
MONO ABS: 0.3 10*3/uL (ref 0.1–1.0)
Monocytes Relative: 8 %
NEUTROS ABS: 3 10*3/uL (ref 1.7–7.7)
NEUTROS PCT: 74 %
Platelets: 154 10*3/uL (ref 150–400)
RBC: 3.73 MIL/uL — AB (ref 3.87–5.11)
RDW: 13.5 % (ref 11.5–15.5)
WBC: 4.1 10*3/uL (ref 4.0–10.5)

## 2015-11-12 LAB — URINE MICROSCOPIC-ADD ON

## 2015-11-12 LAB — COMPREHENSIVE METABOLIC PANEL
ALT: 17 U/L (ref 14–54)
ANION GAP: 7 (ref 5–15)
AST: 30 U/L (ref 15–41)
Albumin: 3.3 g/dL — ABNORMAL LOW (ref 3.5–5.0)
Alkaline Phosphatase: 64 U/L (ref 38–126)
BUN: 11 mg/dL (ref 6–20)
CHLORIDE: 105 mmol/L (ref 101–111)
CO2: 21 mmol/L — AB (ref 22–32)
CREATININE: 0.7 mg/dL (ref 0.44–1.00)
Calcium: 9.1 mg/dL (ref 8.9–10.3)
Glucose, Bld: 92 mg/dL (ref 65–99)
POTASSIUM: 3.9 mmol/L (ref 3.5–5.1)
SODIUM: 133 mmol/L — AB (ref 135–145)
Total Bilirubin: 0.2 mg/dL — ABNORMAL LOW (ref 0.3–1.2)
Total Protein: 9 g/dL — ABNORMAL HIGH (ref 6.5–8.1)

## 2015-11-12 LAB — I-STAT BETA HCG BLOOD, ED (MC, WL, AP ONLY)

## 2015-11-12 LAB — LIPASE, BLOOD: LIPASE: 21 U/L (ref 11–51)

## 2015-11-12 MED ORDER — LEVOFLOXACIN 750 MG PO TABS
750.0000 mg | ORAL_TABLET | Freq: Every day | ORAL | 0 refills | Status: DC
Start: 2015-11-12 — End: 2015-12-06

## 2015-11-12 MED ORDER — METRONIDAZOLE 500 MG PO TABS
2000.0000 mg | ORAL_TABLET | Freq: Once | ORAL | Status: AC
  Administered 2015-11-12: 2000 mg via ORAL
  Filled 2015-11-12: qty 4

## 2015-11-12 MED ORDER — OXYCODONE HCL 5 MG PO TABS
5.0000 mg | ORAL_TABLET | Freq: Once | ORAL | Status: AC
  Administered 2015-11-12: 5 mg via ORAL
  Filled 2015-11-12: qty 1

## 2015-11-12 MED ORDER — ONDANSETRON HCL 4 MG/2ML IJ SOLN
4.0000 mg | Freq: Once | INTRAMUSCULAR | Status: AC
  Administered 2015-11-12: 4 mg via INTRAVENOUS
  Filled 2015-11-12: qty 2

## 2015-11-12 MED ORDER — SODIUM CHLORIDE 0.9 % IV BOLUS (SEPSIS)
1000.0000 mL | Freq: Once | INTRAVENOUS | Status: AC
  Administered 2015-11-12: 1000 mL via INTRAVENOUS

## 2015-11-12 MED ORDER — HYDROMORPHONE HCL 1 MG/ML IJ SOLN
0.5000 mg | Freq: Once | INTRAMUSCULAR | Status: AC
  Administered 2015-11-12: 0.5 mg via INTRAVENOUS
  Filled 2015-11-12: qty 1

## 2015-11-12 MED ORDER — OXYCODONE HCL 5 MG PO TABS: 5.0000 mg | ORAL_TABLET | ORAL | 0 refills | Status: DC | PRN

## 2015-11-12 NOTE — ED Provider Notes (Signed)
[facility]-EMERGENCY DEPT Provider Note   CSN: [id number] Arrival date & time: 11/12/15  1043     History   Chief Complaint Chief Complaint  Patient presents with  . Abdominal Pain    HPI Kara Allen is a 35 y.o. female.  The history is provided by the patient and medical records.  Abdominal Pain   Associated symptoms include nausea and vomiting.   35 year old female with history of ovarian cysts, recurrent UTIs, HIV with last CD4 count 100 in July 2017, presenting to the ED for acute on chronic abdominal pain. Patient states she has been dealing with this "a while". States symptoms seem to wax and wane, but usually she is able to manage this at home. Reports over the past week she has had intermittent abdominal pain with nausea and vomiting. States her entire abdomen is hurting at this time, worse in the lower portion.  She denies fever or chills.  States last night she tried to eat, however was unable to hold anything down.  Has not tried to eat or drink today.  States emesis has been non-bloody, non-bilious.  No diarrhea.  States she tried some OTC zyrtec her boyfriend got her, was not sure if this would help or not.  She denies any known urinary symptoms but states she has a lifelong history of UTI's due to vesico-ureteral reflux.  Prior abdominal surgeries in include left oophorectomy and salpingectomy secondary to ovarian cyst as well as c-section.  No chest pain or SOB.  No cough or other URI symptoms.  States she is followed by ID, Dr. Orvan Falconer for her HIV.  States she has been compliant with her meds.  Past Medical History:  Diagnosis Date  . Cyst of ovary    PT STATES SHE IS TAKING ANTIBIOTICS FOR CYST ON OVARY  . HIV (human immunodeficiency virus infection) (HCC)   . Hydronephrosis, left   . Pyelonephritis   . Recurrent boils   . Trichomonas infection   . Urinary frequency    and nocturia    Patient Active Problem List   Diagnosis Date Noted  . Gum inflammation  06/02/2014  . Pelvic pain in female   . Left ovarian cyst   . Back pain   . 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 HIV (human immunodeficiency virus infection) (HCC) 10/01/2013  . Hydronephrosis, left 10/01/2013  . BOILS, RECURRENT 02/12/2008  . ANXIETY 01/30/2006  . CIGARETTE SMOKER 01/30/2006  . Depression 01/30/2006  . Mild cognitive impairment 01/30/2006  . ALLERGIC RHINITIS 01/30/2006  . SYMPTOM, ENURESIS, NOCTURNAL 01/30/2006    Past Surgical History:  Procedure Laterality Date  . CESAREAN SECTION  2006  . CYSTOSCOPY WITH RETROGRADE PYELOGRAM, URETEROSCOPY AND STENT PLACEMENT Bilateral 10/28/2013   Procedure: CYSTOSCOPY WITH RETROGRADE PYELOGRAM, CYSTOGRAM;  Surgeon: Valetta Fuller, MD;  Location: [facility];  Service: Urology;  Laterality: Bilateral;  . LAPAROSCOPIC UNILATERAL SALPINGO OOPHERECTOMY Left 01/05/2014   Procedure: LAPAROSCOPIC LEFT SALPINGO OOPHORECTOMY;  Surgeon: Catalina Antigua, MD;  Location: [facility];  Service: Gynecology;  Laterality: Left;    OB History    Gravida Para Term Preterm AB Living   2 1 1   1 1    SAB TAB Ectopic Multiple Live Births   1               Home Medications    Prior to Admission medications   Medication Sig Start Date End Date Taking? Authorizing Provider  cephALEXin (KEFLEX) 500 MG capsule Take 1 capsule (500 mg total)  by mouth 4 (four) times daily. 08/14/15   Roxy Horsemanobert Browning, PA-C  dapsone 100 MG tablet Take 1 tablet (100 mg total) by mouth daily. 08/03/15   Cliffton AstersJohn Campbell, MD  EDURANT 25 MG TABS tablet TAKE ONE TABLET BY MOUTH EVERY MORNING WITH BREAKFAST 09/08/15   Cliffton AstersJohn Campbell, MD  EPIVIR 10 MG/ML solution TAKE 30 MILLILITERS (2 TABLESPOONFULS) BY MOUTH ONCE DAILY 09/08/15   Cliffton AstersJohn Campbell, MD  metroNIDAZOLE (FLAGYL) 500 MG tablet Take 1 tablet (500 mg total) by mouth 2 (two) times daily. Patient not taking: Reported on 08/03/2015 02/22/15   Rolm GalaStevi Barrett, PA-C  ondansetron (ZOFRAN) 4 MG tablet Take 1 tablet (4 mg total) by mouth every 6 (six)  hours. 08/03/15   Cliffton AstersJohn Campbell, MD  TIVICAY 50 MG tablet TAKE ONE (1) TABLET BY MOUTH EVERY DAY 09/08/15   Cliffton AstersJohn Campbell, MD  traMADol (ULTRAM) 50 MG tablet Take 1 tablet (50 mg total) by mouth 2 (two) times daily. 02/22/15   Rolm GalaStevi Barrett, PA-C    Family History Family History  Problem Relation Age of Onset  . HIV Mother     Social History Social History  Substance Use Topics  . Smoking status: Current Every Day Smoker    Packs/day: 0.10    Years: 14.00    Types: Cigarettes, Cigars  . Smokeless tobacco: Never Used     Comment: smoking 1-2 black and milds per day  . Alcohol use No     Comment:  - does smoke tobacco product called blackmilds     Allergies   Ibuprofen; Latex; Sulfamethoxazole-trimethoprim; and Tylenol [acetaminophen]   Review of Systems Review of Systems  Gastrointestinal: Positive for abdominal pain, nausea and vomiting.  All other systems reviewed and are negative.    Physical Exam Updated Vital Signs BP 125/97   Pulse 98   Temp 99.2 F (37.3 C)   Resp 20   LMP 11/01/2015   SpO2 99%   Physical Exam  Constitutional: She is oriented to person, place, and time. She appears well-developed and well-nourished.  Playing on cell phone during exam, no distress  HENT:  Head: Normocephalic and atraumatic.  Mouth/Throat: Oropharynx is clear and moist.  Mucous membranes appear moist  Eyes: Conjunctivae and EOM are normal. Pupils are equal, round, and reactive to light.  Neck: Normal range of motion.  Cardiovascular: Normal rate, regular rhythm and normal heart sounds.   Pulmonary/Chest: Effort normal and breath sounds normal. No respiratory distress. She has no wheezes.  Lungs clear, no distress, speaking in full sentences throughout entire exam  Abdominal: Soft. Bowel sounds are normal. There is no tenderness. There is no rigidity, no guarding and no CVA tenderness.  Abdomen soft, non-distended, no focal tenderness or peritonitis; no CVA tenderness    Musculoskeletal: Normal range of motion.  Neurological: She is alert and oriented to person, place, and time.  Skin: Skin is warm and dry.  Psychiatric: She has a normal mood and affect.  Nursing note and vitals reviewed.    ED Treatments / Results  Labs (all labs ordered are listed, but only abnormal results are displayed) Labs Reviewed  COMPREHENSIVE METABOLIC PANEL - Abnormal; Notable for the following:       Result Value   Sodium 133 (*)    CO2 21 (*)    Total Protein 9.0 (*)    Albumin 3.3 (*)    Total Bilirubin 0.2 (*)    All other components within normal limits  URINALYSIS, ROUTINE W REFLEX MICROSCOPIC (NOT AT  [facility]) - Abnormal; Notable for the following:    APPearance TURBID (*)    Hgb urine dipstick MODERATE (*)    Protein, ur 100 (*)    Leukocytes, UA LARGE (*)    All other components within normal limits  CBC WITH DIFFERENTIAL/PLATELET - Abnormal; Notable for the following:    RBC 3.73 (*)    Hemoglobin 11.4 (*)    HCT 34.2 (*)    All other components within normal limits  URINE MICROSCOPIC-ADD ON - Abnormal; Notable for the following:    Squamous Epithelial / LPF 6-30 (*)    Bacteria, UA MANY (*)    All other components within normal limits  URINE CULTURE  LIPASE, BLOOD  I-STAT BETA HCG BLOOD, ED ([facility], [facility], [facility] ONLY)    EKG  EKG Interpretation None       Radiology No results found.  Procedures Procedures (including critical care time)  Medications Ordered in ED Medications  ondansetron (ZOFRAN) injection 4 mg (4 mg Intravenous Given 11/12/15 1137)  HYDROmorphone (DILAUDID) injection 0.5 mg (0.5 mg Intravenous Given 11/12/15 1137)  sodium chloride 0.9 % bolus 1,000 mL (0 mLs Intravenous Stopped 11/12/15 1422)  oxyCODONE (Oxy IR/ROXICODONE) immediate release tablet 5 mg (5 mg Oral Given 11/12/15 1320)  ondansetron (ZOFRAN) injection 4 mg (4 mg Intravenous Given 11/12/15 1323)  metroNIDAZOLE (FLAGYL) tablet 2,000 mg (2,000 mg Oral Given 11/12/15  1421)     Initial Impression / Assessment and Plan / ED Course  I have reviewed the triage vital signs and the nursing notes.  Pertinent labs & imaging results that were available during my care of the patient were reviewed by me and considered in my medical decision making (see chart for details).  Clinical Course    35 year old female with history of HIV, most recent CD4 count 100 in July 2017, presenting to the ED for abdominal pain. Based on chart review and patient history, this seems to be an acute on chronic issue. She does report some nausea and vomiting at home. Here she is afebrile and nontoxic. Her mucous membranes appear moist and she does not appear significantly dehydrated. Her abdomen is soft and benign. No CVA tenderness.  Labwork is overall reassuring, normal white blood cell count. UA does appear infectious with many bacteria. She does have Trichomonas in her urine, this appears to be a recurrent issue for her as well. She denies any pelvic pain or vaginal discharge. She was treated here with IV fluids and antibiotics with good improvement of her symptoms. She has been drinking soda in the room without issue. She is tolerating oral medications. She remains afebrile and nontoxic with benign abdominal exam. Patient is immune compromised, she is on Dapsone for prophylaxis for pneumocystis prescribed by Dr. Orvan Falconerampbell, however she continues to appear well with stable VS without any signs/symptoms concerning for sepsis.  Patient was treated here with flagyl for trichomonas.  Her last urine cultures revealed multiple species.  Will start on Levaquin for broader coverage given her immune status secondary to HIV pending urine culture.  She has previously scheduled follow-up with her infectious disease doctor on Wednesday (4 days from now).  Discussed plan with patient, he/she acknowledged understanding and agreed with plan of care.  Return precautions given for new or worsening symptoms.  Case  discussed with attending physician, Dr. Adela LankFloyd,  agrees with assessment and plan of care.  Final Clinical Impressions(s) / ED Diagnoses   Final diagnoses:  Generalized abdominal pain  Acute cystitis with hematuria  Nausea    New Prescriptions Discharge Medication List as of 11/12/2015  2:04 PM    START taking these medications   Details  levofloxacin (LEVAQUIN) 750 MG tablet Take 1 tablet (750 mg total) by mouth daily., Starting Sat 11/12/2015, Print    oxyCODONE (ROXICODONE) 5 MG immediate release tablet Take 1 tablet (5 mg total) by mouth every 4 (four) hours as needed for severe pain., Starting Sat 11/12/2015, Print         Garlon Hatchet, PA-C 11/12/15 1812    Melene Plan, DO 11/13/15 1242

## 2015-11-12 NOTE — ED Notes (Signed)
She is drinking ginger ale, which she brought, without difficulty.

## 2015-11-12 NOTE — ED Triage Notes (Signed)
Per pt, states lower abdominal pain and vomiting for 1 week-increased pain for 2 days

## 2015-11-12 NOTE — Discharge Instructions (Signed)
Take the prescribed medication as directed. Follow-up with Dr. Orvan Falconerampbell on Wednesday as scheduled. Return to the ED for new or worsening symptoms.

## 2015-11-13 LAB — URINE CULTURE

## 2015-11-16 ENCOUNTER — Encounter: Payer: Medicaid Other | Admitting: Obstetrics & Gynecology

## 2015-11-21 ENCOUNTER — Emergency Department (HOSPITAL_COMMUNITY)
Admission: EM | Admit: 2015-11-21 | Discharge: 2015-11-21 | Disposition: A | Discharge status: Home / Self Care | Payer: Medicaid Other | Attending: Emergency Medicine | Admitting: Emergency Medicine

## 2015-11-21 ENCOUNTER — Emergency Department (HOSPITAL_COMMUNITY): Payer: Medicaid Other

## 2015-11-21 ENCOUNTER — Encounter (HOSPITAL_COMMUNITY): Payer: Self-pay | Admitting: Emergency Medicine

## 2015-11-21 DIAGNOSIS — N390 Urinary tract infection, site not specified: Secondary | ICD-10-CM | POA: Insufficient documentation

## 2015-11-21 DIAGNOSIS — R1030 Lower abdominal pain, unspecified: Secondary | ICD-10-CM | POA: Diagnosis present

## 2015-11-21 DIAGNOSIS — Z79899 Other long term (current) drug therapy: Secondary | ICD-10-CM | POA: Diagnosis not present

## 2015-11-21 DIAGNOSIS — Z91040 Latex allergy status: Secondary | ICD-10-CM | POA: Insufficient documentation

## 2015-11-21 DIAGNOSIS — F17210 Nicotine dependence, cigarettes, uncomplicated: Secondary | ICD-10-CM | POA: Insufficient documentation

## 2015-11-21 LAB — COMPREHENSIVE METABOLIC PANEL
ALBUMIN: 3.4 g/dL — AB (ref 3.5–5.0)
ALT: 17 U/L (ref 14–54)
AST: 26 U/L (ref 15–41)
Alkaline Phosphatase: 59 U/L (ref 38–126)
Anion gap: 8 (ref 5–15)
BUN: 12 mg/dL (ref 6–20)
CHLORIDE: 106 mmol/L (ref 101–111)
CO2: 20 mmol/L — AB (ref 22–32)
CREATININE: 0.66 mg/dL (ref 0.44–1.00)
Calcium: 9.8 mg/dL (ref 8.9–10.3)
GFR calc Af Amer: >60 mL/min (ref >60)
GFR calc non Af Amer: >60 mL/min (ref >60)
GLUCOSE: 96 mg/dL (ref 65–99)
Potassium: 4.4 mmol/L (ref 3.5–5.1)
SODIUM: 134 mmol/L — AB (ref 135–145)
Total Bilirubin: 0.3 mg/dL (ref 0.3–1.2)
Total Protein: 10.3 g/dL — ABNORMAL HIGH (ref 6.5–8.1)

## 2015-11-21 LAB — URINALYSIS, ROUTINE W REFLEX MICROSCOPIC
Bilirubin Urine: NEGATIVE
GLUCOSE, UA: NEGATIVE mg/dL
KETONES UR: NEGATIVE mg/dL
Nitrite: NEGATIVE
PH: 6.5 (ref 5.0–8.0)
PROTEIN: 100 mg/dL — AB
Specific Gravity, Urine: 1.016 (ref 1.005–1.030)

## 2015-11-21 LAB — CBC
HCT: 38 % (ref 36.0–46.0)
Hemoglobin: 12.7 g/dL (ref 12.0–15.0)
MCH: 30.7 pg (ref 26.0–34.0)
MCHC: 33.4 g/dL (ref 30.0–36.0)
MCV: 91.8 fL (ref 78.0–100.0)
PLATELETS: 227 10*3/uL (ref 150–400)
RBC: 4.14 MIL/uL (ref 3.87–5.11)
RDW: 14.1 % (ref 11.5–15.5)
WBC: 4.1 10*3/uL (ref 4.0–10.5)

## 2015-11-21 LAB — URINE MICROSCOPIC-ADD ON

## 2015-11-21 LAB — LIPASE, BLOOD: LIPASE: 26 U/L (ref 11–51)

## 2015-11-21 LAB — I-STAT BETA HCG BLOOD, ED (MC, WL, AP ONLY): I-stat hCG, quantitative: <5 m[IU]/mL (ref <5)

## 2015-11-21 LAB — WET PREP, GENITAL
Sperm: NONE SEEN
TRICH WET PREP: NONE SEEN
YEAST WET PREP: NONE SEEN

## 2015-11-21 MED ORDER — ONDANSETRON 4 MG PO TBDP
4.0000 mg | ORAL_TABLET | Freq: Once | ORAL | Status: AC
  Administered 2015-11-21: 4 mg via ORAL
  Filled 2015-11-21: qty 1

## 2015-11-21 MED ORDER — PROMETHAZINE HCL 25 MG RE SUPP: 25.0000 mg | Freq: Four times a day (QID) | RECTAL | 0 refills | Status: DC | PRN

## 2015-11-21 MED ORDER — OXYCODONE HCL 5 MG PO TABS: 5.0000 mg | ORAL_TABLET | ORAL | 0 refills | Status: DC | PRN

## 2015-11-21 MED ORDER — CEPHALEXIN 500 MG PO CAPS
500.0000 mg | ORAL_CAPSULE | Freq: Four times a day (QID) | ORAL | 0 refills | Status: DC
Start: 2015-11-21 — End: 2015-12-06

## 2015-11-21 MED ORDER — ONDANSETRON HCL 4 MG/2ML IJ SOLN
4.0000 mg | Freq: Once | INTRAMUSCULAR | Status: AC
  Administered 2015-11-21: 4 mg via INTRAVENOUS
  Filled 2015-11-21: qty 2

## 2015-11-21 MED ORDER — HYDROMORPHONE HCL 2 MG/ML IJ SOLN
1.0000 mg | Freq: Once | INTRAMUSCULAR | Status: AC
  Administered 2015-11-21: 1 mg via INTRAVENOUS
  Filled 2015-11-21: qty 1

## 2015-11-21 MED ORDER — DEXTROSE 5 % IV SOLN
1.0000 g | Freq: Once | INTRAVENOUS | Status: AC
  Administered 2015-11-21: 1 g via INTRAVENOUS
  Filled 2015-11-21: qty 10

## 2015-11-21 MED ORDER — SODIUM CHLORIDE 0.9 % IV BOLUS (SEPSIS)
1000.0000 mL | Freq: Once | INTRAVENOUS | Status: AC
  Administered 2015-11-21: 1000 mL via INTRAVENOUS

## 2015-11-21 MED ORDER — IOPAMIDOL (ISOVUE-300) INJECTION 61%
INTRAVENOUS | Status: AC
  Administered 2015-11-21: 100 mL via INTRAVENOUS
  Filled 2015-11-21: qty 100

## 2015-11-21 MED ORDER — OXYCODONE HCL 5 MG PO TABS
5.0000 mg | ORAL_TABLET | Freq: Once | ORAL | Status: AC
  Administered 2015-11-21: 5 mg via ORAL
  Filled 2015-11-21: qty 1

## 2015-11-21 MED ORDER — ONDANSETRON 4 MG PO TBDP: 4.0000 mg | ORAL_TABLET | Freq: Three times a day (TID) | ORAL | 0 refills | Status: AC | PRN

## 2015-11-21 NOTE — ED Provider Notes (Signed)
[facility]-EMERGENCY DEPT Provider Note   CSN: [id number] Arrival date & time: 11/21/15  4098     History   Chief Complaint Chief Complaint  Patient presents with  . Abdominal Pain    HPI Kara Allen is a 35 y.o. female.  HPI  35 year old female with a history of HIV presents with abdominal pain for about 2 or 3 weeks. Patient states she has had abdominal pain for 3 years that waxes and wanes. Typically seems to get worse before her menstrual cycle. She was seen at [facility] long and prescribed Levaquin for UTI and oxycodone for pain. The oxycodone did not help. She has not had any fevers during this time. Has not had any urinary symptoms including no hematuria or dysuria. She denies vaginal discharge. Originally points that the pain is worse in the lower abdomen but states that it goes around in a circle to her back. Some parts feel like the chronic pain she has in some parts feel different. No diarrhea, last bowel movement yesterday. Has been having vomiting over the last 2 days and was not able to finish the Levaquin.  Past Medical History:  Diagnosis Date  . Cyst of ovary    PT STATES SHE IS TAKING ANTIBIOTICS FOR CYST ON OVARY  . HIV (human immunodeficiency virus infection) (HCC)   . Hydronephrosis, left   . Pyelonephritis   . Recurrent boils   . Trichomonas infection   . Urinary frequency    and nocturia    Patient Active Problem List   Diagnosis Date Noted  . Gum inflammation 06/02/2014  . Pelvic pain in female   . Left ovarian cyst   . Back pain   . 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 HIV (human immunodeficiency virus infection) (HCC) 10/01/2013  . Hydronephrosis, left 10/01/2013  . BOILS, RECURRENT 02/12/2008  . ANXIETY 01/30/2006  . CIGARETTE SMOKER 01/30/2006  . Depression 01/30/2006  . Mild cognitive impairment 01/30/2006  . ALLERGIC RHINITIS 01/30/2006  . SYMPTOM, ENURESIS, NOCTURNAL 01/30/2006    Past Surgical History:  Procedure Laterality Date  .  CESAREAN SECTION  2006  . CYSTOSCOPY WITH RETROGRADE PYELOGRAM, URETEROSCOPY AND STENT PLACEMENT Bilateral 10/28/2013   Procedure: CYSTOSCOPY WITH RETROGRADE PYELOGRAM, CYSTOGRAM;  Surgeon: Valetta Fuller, MD;  Location: [facility];  Service: Urology;  Laterality: Bilateral;  . LAPAROSCOPIC UNILATERAL SALPINGO OOPHERECTOMY Left 01/05/2014   Procedure: LAPAROSCOPIC LEFT SALPINGO OOPHORECTOMY;  Surgeon: Catalina Antigua, MD;  Location: [facility];  Service: Gynecology;  Laterality: Left;    OB History    Gravida Para Term Preterm AB Living   2 1 1   1 1    SAB TAB Ectopic Multiple Live Births   1               Home Medications    Prior to Admission medications   Medication Sig Start Date End Date Taking? Authorizing Provider  EPIVIR 10 MG/ML solution TAKE 30 MILLILITERS (2 TABLESPOONFULS) BY MOUTH ONCE DAILY 09/08/15  Yes Cliffton Asters, MD  levofloxacin (LEVAQUIN) 750 MG tablet Take 1 tablet (750 mg total) by mouth daily. 11/12/15  Yes Garlon Hatchet, PA-C  cephALEXin (KEFLEX) 500 MG capsule Take 1 capsule (500 mg total) by mouth 4 (four) times daily. 11/21/15   Pricilla Loveless, MD  dapsone 100 MG tablet Take 1 tablet (100 mg total) by mouth daily. 08/03/15   Cliffton Asters, MD  EDURANT 25 MG TABS tablet TAKE ONE TABLET BY MOUTH EVERY MORNING WITH BREAKFAST Patient not taking: Reported  on 11/21/2015 09/08/15   Cliffton AstersJohn Campbell, MD  metroNIDAZOLE (FLAGYL) 500 MG tablet Take 1 tablet (500 mg total) by mouth 2 (two) times daily. Patient not taking: Reported on 11/21/2015 02/22/15   Rolm GalaStevi Barrett, PA-C  ondansetron (ZOFRAN ODT) 4 MG disintegrating tablet Take 1 tablet (4 mg total) by mouth every 8 (eight) hours as needed for nausea or vomiting. 11/21/15   Pricilla LovelessScott Madlynn Lundeen, MD  ondansetron (ZOFRAN) 4 MG tablet Take 1 tablet (4 mg total) by mouth every 6 (six) hours. Patient not taking: Reported on 11/21/2015 08/03/15   Cliffton AstersJohn Campbell, MD  oxyCODONE (ROXICODONE) 5 MG immediate release tablet Take 1-2 tablets (5-10 mg  total) by mouth every 4 (four) hours as needed for severe pain. 11/21/15   Pricilla LovelessScott Eldonna Neuenfeldt, MD  promethazine (PHENERGAN) 25 MG suppository Place 1 suppository (25 mg total) rectally every 6 (six) hours as needed for nausea or vomiting. 11/21/15   Pricilla LovelessScott Montie Swiderski, MD  TIVICAY 50 MG tablet TAKE ONE (1) TABLET BY MOUTH EVERY DAY 09/08/15   Cliffton AstersJohn Campbell, MD  traMADol (ULTRAM) 50 MG tablet Take 1 tablet (50 mg total) by mouth 2 (two) times daily. Patient not taking: Reported on 11/21/2015 02/22/15   Alveta HeimlichStevi Barrett, PA-C    Family History Family History  Problem Relation Age of Onset  . HIV Mother     Social History Social History  Substance Use Topics  . Smoking status: Current Every Day Smoker    Packs/day: 0.10    Years: 14.00    Types: Cigarettes, Cigars  . Smokeless tobacco: Never Used     Comment: smoking 1-2 black and milds per day  . Alcohol use No     Comment:  - does smoke tobacco product called blackmilds     Allergies   Ibuprofen; Latex; Sulfamethoxazole-trimethoprim; and Tylenol [acetaminophen]   Review of Systems Review of Systems  Constitutional: Negative for fever.  Gastrointestinal: Positive for abdominal pain, nausea and vomiting. Negative for blood in stool and diarrhea.  Genitourinary: Negative for dysuria, hematuria, menstrual problem, vaginal bleeding and vaginal discharge.  Musculoskeletal: Positive for back pain.  All other systems reviewed and are negative.    Physical Exam Updated Vital Signs BP 102/69   Pulse 69   Temp 97.9 F (36.6 C) (Oral)   Resp 13   Ht 5\' 2"  (1.575 m)   Wt 155 lb (70.3 kg)   LMP 11/01/2015   SpO2 100%   BMI 28.35 kg/m   Physical Exam  Constitutional: She is oriented to person, place, and time. She appears well-developed and well-nourished. No distress.  HENT:  Head: Normocephalic and atraumatic.  Right Ear: External ear normal.  Left Ear: External ear normal.  Nose: Nose normal.  Eyes: Right eye exhibits no discharge.  Left eye exhibits no discharge.  Cardiovascular: Normal rate, regular rhythm and normal heart sounds.   Pulmonary/Chest: Effort normal and breath sounds normal.  Abdominal: Soft. There is tenderness (diffuse, patient tells me it's worse suprapubic).  Genitourinary: No bleeding in the vagina. Vaginal discharge (scant) found.  Genitourinary Comments: Some tenderness with movement of cervix but this is not reproducing the lower abdominal pain she's having  Musculoskeletal:  Diffuse back pain that includes CVA bilaterally but also diffuse lumbar back  Neurological: She is alert and oriented to person, place, and time.  Skin: Skin is warm and dry. She is not diaphoretic.  Nursing note and vitals reviewed.    ED Treatments / Results  Labs (all labs ordered are listed, but  only abnormal results are displayed) Labs Reviewed  WET PREP, GENITAL - Abnormal; Notable for the following:       Result Value   Clue Cells Wet Prep HPF POC PRESENT (*)    WBC, Wet Prep HPF POC MODERATE (*)    All other components within normal limits  COMPREHENSIVE METABOLIC PANEL - Abnormal; Notable for the following:    Sodium 134 (*)    CO2 20 (*)    Total Protein 10.3 (*)    Albumin 3.4 (*)    All other components within normal limits  URINALYSIS, ROUTINE W REFLEX MICROSCOPIC (NOT AT [facility]) - Abnormal; Notable for the following:    APPearance CLOUDY (*)    Hgb urine dipstick MODERATE (*)    Protein, ur 100 (*)    Leukocytes, UA LARGE (*)    All other components within normal limits  URINE MICROSCOPIC-ADD ON - Abnormal; Notable for the following:    Squamous Epithelial / LPF 6-30 (*)    Bacteria, UA RARE (*)    All other components within normal limits  URINE CULTURE  LIPASE, BLOOD  CBC  I-STAT BETA HCG BLOOD, ED ([facility], [facility], [facility] ONLY)  GC/CHLAMYDIA PROBE AMP ([place]) NOT AT [facility]    EKG  EKG Interpretation None       Radiology Ct Abdomen Pelvis W Contrast  Result Date: 11/21/2015 CLINICAL DATA:   35 year old female. HIV positive since birth. Generalized but more so lower abdominal pain. Bilateral flank pain. Diagnosed with UTI last week on antibiotics. Initial encounter. EXAM: CT ABDOMEN AND PELVIS WITH CONTRAST TECHNIQUE: Multidetector CT imaging of the abdomen and pelvis was performed using the standard protocol following bolus administration of intravenous contrast. CONTRAST:  1 ISOVUE-300 IOPAMIDOL (ISOVUE-300) INJECTION 61% COMPARISON:  Pelvis ultrasound and CT Abdomen and Pelvis 02/22/2015 and earlier. FINDINGS: Lower chest: No pericardial or pleural effusion. Minimal dependent atelectasis or scarring at both lung bases. No upper abdominal free air. Hepatobiliary: Negative liver and gallbladder aside from scattered chronic hepatic calcified granulomas. Pancreas: Chronic prominence of the main pancreatic duct is stable. No peripancreatic inflammation. Spleen: Negative. Adrenals/Urinary Tract: Negative adrenal glands. Chronic abnormal appearance of both kidneys without significant interval change. A constellation of intermittent renal cortical volume loss, enlarged and heterogeneous renal pyramids, as well as left greater than right urothelial thickening again noted. No delayed renal excretory phase images were obtained. No perinephric stranding. Beaded appearance of the proximal left ureter again noted. No periureteral stranding. Stomach/Bowel: Negative rectum. Intermittent retained stool in the sigmoid colon which otherwise is negative. Decompressed left colon. Negative splenic flexure and transverse colon. Negative right colon. Negative retrocecal appendix. Negative terminal ileum. No dilated small bowel. Negative stomach and duodenum. Vascular/Lymphatic: Mild chronic mural soft plaque along the distal abdominal aorta (series 2, image 43). Otherwise normal major arterial structures. Portal venous system is patent. Stable mild retroperitoneal lymphadenopathy mostly about the left kidney with  individual nodes up to 16 mm short axis. Reproductive: Improved appearance of the right adnexa since February, physiologic appearing 2 cm cyst suspected in the ovary at this time. Negative uterus and left adnexa. Other: Trace pelvic free fluid. Musculoskeletal: No acute osseous abnormality identified. IMPRESSION: 1. No new or acute abnormality in the abdomen or pelvis. 2. Stable CT appearance of chronically abnormal kidneys including left greater than right renal cortical atrophy and chronic urothelial inflammation. 3. Mild chronic retroperitoneal lymphadenopathy is stable and probably is related to #2, or less likely HIV related lymphoproliferative disorder. 4. Probable physiologic  trace pelvic free fluid and right ovarian cyst. Electronically Signed   By: Odessa FlemingH  Hall M.D.   On: 11/21/2015 12:06    Procedures Procedures (including critical care time)  Medications Ordered in ED Medications  ondansetron (ZOFRAN) injection 4 mg (4 mg Intravenous Given 11/21/15 1047)  sodium chloride 0.9 % bolus 1,000 mL (0 mLs Intravenous Stopped 11/21/15 1309)  HYDROmorphone (DILAUDID) injection 1 mg (1 mg Intravenous Given 11/21/15 1051)  iopamidol (ISOVUE-300) 61 % injection (100 mLs Intravenous Contrast Given 11/21/15 1134)  sodium chloride 0.9 % bolus 1,000 mL (0 mLs Intravenous Stopped 11/21/15 1520)  cefTRIAXone (ROCEPHIN) 1 g in dextrose 5 % 50 mL IVPB (0 g Intravenous Stopped 11/21/15 1439)  ondansetron (ZOFRAN-ODT) disintegrating tablet 4 mg (4 mg Oral Given 11/21/15 1437)  oxyCODONE (Oxy IR/ROXICODONE) immediate release tablet 5 mg (5 mg Oral Given 11/21/15 1438)     Initial Impression / Assessment and Plan / ED Course  I have reviewed the triage vital signs and the nursing notes.  Pertinent labs & imaging results that were available during my care of the patient were reviewed by me and considered in my medical decision making (see chart for details).  Clinical Course as of Nov 21 1619  Mon Nov 21, 2015   1013 Hard to tell if this is an acute exacerbation of a chronic abd issue or something new. Given vomiting and report of newer pain, will get CT in addition to labs and urine.   [SG]    Clinical Course User Index [SG] Pricilla LovelessScott Mekala Winger, MD    Unclear if this urine represents UTI given that she is not symptomatic with dysuria or frequency. However given she has suprapubic pain but not cervical motion tenderness or vaginal discharge at that it is more likely to be UTI rather than PID. Especially given the time course. Unclear if she really "failed" Levaquin, will be given Keflex. I offered admission given that she has had recent vomiting and her HIV status. She declines and wants to go home. She has not vomited while in the ED. Pain is much better. Discussed that if her symptoms were to worsen or not improve at all she needs to return immediately for evaluation. She understands this and agrees.  Final Clinical Impressions(s) / ED Diagnoses   Final diagnoses:  Lower abdominal pain  Acute UTI    New Prescriptions Discharge Medication List as of 11/21/2015  1:13 PM    START taking these medications   Details  ondansetron (ZOFRAN ODT) 4 MG disintegrating tablet Take 1 tablet (4 mg total) by mouth every 8 (eight) hours as needed for nausea or vomiting., Starting Mon 11/21/2015, Print    promethazine (PHENERGAN) 25 MG suppository Place 1 suppository (25 mg total) rectally every 6 (six) hours as needed for nausea or vomiting., Starting Mon 11/21/2015, Print         Pricilla LovelessScott Minh Roanhorse, MD 11/21/15 1622

## 2015-11-21 NOTE — ED Triage Notes (Signed)
Pt reports lower abd pain for over one week. [facility] and has been on levofloxacin for 1 week with no improvement. Pt reports 3 episodes of vomiting today.

## 2015-11-21 NOTE — ED Notes (Signed)
Pt oob to br with steady gait 

## 2015-11-21 NOTE — ED Notes (Signed)
Pt states she understands instructions. Home stable with steady gait.All questions answered. 

## 2015-11-21 NOTE — ED Notes (Signed)
Pt removed her own IV stating she must go. Site clear without bleeding.

## 2015-11-21 NOTE — ED Notes (Addendum)
Pt ambulated to room, stated she did not want a wheelchair because she "could feel every bump and it made the pain worse." pt stated she did not have to use the restroom at this time.

## 2015-11-22 LAB — GC/CHLAMYDIA PROBE AMP (~~LOC~~) NOT AT ARMC
Chlamydia: NEGATIVE
NEISSERIA GONORRHEA: NEGATIVE

## 2015-11-22 LAB — URINE CULTURE

## 2015-12-06 ENCOUNTER — Inpatient Hospital Stay (HOSPITAL_COMMUNITY)
Admission: EM | Admit: 2015-12-06 | Discharge: 2015-12-08 | DRG: 690 | Disposition: A | Discharge status: Home / Self Care | Payer: Medicaid Other | Attending: Family Medicine | Admitting: Family Medicine

## 2015-12-06 ENCOUNTER — Encounter (HOSPITAL_COMMUNITY): Payer: Self-pay | Admitting: Emergency Medicine

## 2015-12-06 DIAGNOSIS — F17210 Nicotine dependence, cigarettes, uncomplicated: Secondary | ICD-10-CM | POA: Diagnosis present

## 2015-12-06 DIAGNOSIS — G8929 Other chronic pain: Secondary | ICD-10-CM | POA: Diagnosis present

## 2015-12-06 DIAGNOSIS — R1011 Right upper quadrant pain: Secondary | ICD-10-CM | POA: Diagnosis not present

## 2015-12-06 DIAGNOSIS — Z21 Asymptomatic human immunodeficiency virus [HIV] infection status: Secondary | ICD-10-CM | POA: Diagnosis present

## 2015-12-06 DIAGNOSIS — R198 Other specified symptoms and signs involving the digestive system and abdomen: Secondary | ICD-10-CM | POA: Diagnosis present

## 2015-12-06 DIAGNOSIS — R7989 Other specified abnormal findings of blood chemistry: Secondary | ICD-10-CM

## 2015-12-06 DIAGNOSIS — Z882 Allergy status to sulfonamides status: Secondary | ICD-10-CM

## 2015-12-06 DIAGNOSIS — R569 Unspecified convulsions: Secondary | ICD-10-CM | POA: Diagnosis present

## 2015-12-06 DIAGNOSIS — Z79891 Long term (current) use of opiate analgesic: Secondary | ICD-10-CM

## 2015-12-06 DIAGNOSIS — F419 Anxiety disorder, unspecified: Secondary | ICD-10-CM | POA: Diagnosis present

## 2015-12-06 DIAGNOSIS — Z886 Allergy status to analgesic agent status: Secondary | ICD-10-CM

## 2015-12-06 DIAGNOSIS — R1030 Lower abdominal pain, unspecified: Secondary | ICD-10-CM | POA: Diagnosis not present

## 2015-12-06 DIAGNOSIS — R109 Unspecified abdominal pain: Secondary | ICD-10-CM | POA: Diagnosis present

## 2015-12-06 DIAGNOSIS — Z888 Allergy status to other drugs, medicaments and biological substances status: Secondary | ICD-10-CM

## 2015-12-06 DIAGNOSIS — Z91040 Latex allergy status: Secondary | ICD-10-CM

## 2015-12-06 DIAGNOSIS — N12 Tubulo-interstitial nephritis, not specified as acute or chronic: Secondary | ICD-10-CM | POA: Diagnosis not present

## 2015-12-06 DIAGNOSIS — E876 Hypokalemia: Secondary | ICD-10-CM | POA: Diagnosis present

## 2015-12-06 DIAGNOSIS — Z79899 Other long term (current) drug therapy: Secondary | ICD-10-CM

## 2015-12-06 DIAGNOSIS — F329 Major depressive disorder, single episode, unspecified: Secondary | ICD-10-CM | POA: Diagnosis present

## 2015-12-06 DIAGNOSIS — R740 Nonspecific elevation of levels of transaminase and lactic acid dehydrogenase [LDH]: Secondary | ICD-10-CM | POA: Diagnosis present

## 2015-12-06 DIAGNOSIS — D899 Disorder involving the immune mechanism, unspecified: Secondary | ICD-10-CM | POA: Diagnosis present

## 2015-12-06 DIAGNOSIS — R590 Localized enlarged lymph nodes: Secondary | ICD-10-CM | POA: Diagnosis present

## 2015-12-06 LAB — HEPATIC FUNCTION PANEL
ALK PHOS: 56 U/L (ref 38–126)
ALT: 15 U/L (ref 14–54)
AST: 23 U/L (ref 15–41)
Albumin: 2.9 g/dL — ABNORMAL LOW (ref 3.5–5.0)
TOTAL PROTEIN: 8.4 g/dL — AB (ref 6.5–8.1)
Total Bilirubin: 0.4 mg/dL (ref 0.3–1.2)

## 2015-12-06 LAB — URINALYSIS, ROUTINE W REFLEX MICROSCOPIC
BILIRUBIN URINE: NEGATIVE
GLUCOSE, UA: NEGATIVE mg/dL
KETONES UR: NEGATIVE mg/dL
Nitrite: NEGATIVE
PH: 6 (ref 5.0–8.0)
Protein, ur: 30 mg/dL — AB
Specific Gravity, Urine: 1.014 (ref 1.005–1.030)

## 2015-12-06 LAB — I-STAT BETA HCG BLOOD, ED (MC, WL, AP ONLY): I-stat hCG, quantitative: <5 m[IU]/mL (ref <5)

## 2015-12-06 LAB — CBC
HCT: 38.2 % (ref 36.0–46.0)
Hemoglobin: 12.7 g/dL (ref 12.0–15.0)
MCH: 30.9 pg (ref 26.0–34.0)
MCHC: 33.2 g/dL (ref 30.0–36.0)
MCV: 92.9 fL (ref 78.0–100.0)
PLATELETS: 146 10*3/uL — AB (ref 150–400)
RBC: 4.11 MIL/uL (ref 3.87–5.11)
RDW: 14.4 % (ref 11.5–15.5)
WBC: 3.8 10*3/uL — AB (ref 4.0–10.5)

## 2015-12-06 LAB — COMPREHENSIVE METABOLIC PANEL
ALT: 17 U/L (ref 14–54)
ANION GAP: 4 — AB (ref 5–15)
AST: 26 U/L (ref 15–41)
Albumin: 3.3 g/dL — ABNORMAL LOW (ref 3.5–5.0)
Alkaline Phosphatase: 65 U/L (ref 38–126)
BUN: 11 mg/dL (ref 6–20)
CHLORIDE: 108 mmol/L (ref 101–111)
CO2: 24 mmol/L (ref 22–32)
CREATININE: 0.63 mg/dL (ref 0.44–1.00)
Calcium: 9.1 mg/dL (ref 8.9–10.3)
Glucose, Bld: 98 mg/dL (ref 65–99)
Potassium: 3.4 mmol/L — ABNORMAL LOW (ref 3.5–5.1)
Sodium: 136 mmol/L (ref 135–145)
Total Bilirubin: 0.5 mg/dL (ref 0.3–1.2)
Total Protein: 9.8 g/dL — ABNORMAL HIGH (ref 6.5–8.1)

## 2015-12-06 LAB — I-STAT CG4 LACTIC ACID, ED
Lactic Acid, Venous: 2.43 mmol/L (ref 0.5–1.9)
Lactic Acid, Venous: 0.75 mmol/L (ref 0.5–1.9)

## 2015-12-06 LAB — LIPASE, BLOOD
LIPASE: 18 U/L (ref 11–51)
Lipase: 19 U/L (ref 11–51)

## 2015-12-06 MED ORDER — SODIUM CHLORIDE 0.9 % IV BOLUS (SEPSIS)
1000.0000 mL | Freq: Once | INTRAVENOUS | Status: AC
Start: 2015-12-06 — End: 2015-12-06
  Administered 2015-12-06: 1000 mL via INTRAVENOUS

## 2015-12-06 MED ORDER — LAMIVUDINE 10 MG/ML PO SOLN
300.0000 mg | Freq: Every day | ORAL | Status: DC
Start: 2015-12-07 — End: 2015-12-08
  Administered 2015-12-07 – 2015-12-08 (×2): 300 mg via ORAL
  Filled 2015-12-06 (×2): qty 30

## 2015-12-06 MED ORDER — MORPHINE SULFATE (PF) 4 MG/ML IV SOLN
4.0000 mg | Freq: Once | INTRAVENOUS | Status: AC
  Administered 2015-12-06: 4 mg via INTRAVENOUS
  Filled 2015-12-06: qty 1

## 2015-12-06 MED ORDER — CEFTRIAXONE SODIUM 1 G IJ SOLR
1.0000 g | Freq: Once | INTRAMUSCULAR | Status: AC
  Administered 2015-12-06: 1 g via INTRAVENOUS
  Filled 2015-12-06: qty 10

## 2015-12-06 MED ORDER — RILPIVIRINE HCL 25 MG PO TABS
25.0000 mg | ORAL_TABLET | Freq: Every day | ORAL | Status: DC
  Administered 2015-12-07 – 2015-12-08 (×2): 25 mg via ORAL
  Filled 2015-12-06 (×2): qty 1

## 2015-12-06 MED ORDER — DEXTROSE 5 % IV SOLN
1.0000 g | INTRAVENOUS | Status: DC
  Administered 2015-12-07: 1 g via INTRAVENOUS
  Filled 2015-12-06 (×2): qty 10

## 2015-12-06 MED ORDER — SODIUM CHLORIDE 0.9 % IV SOLN
INTRAVENOUS | Status: DC
  Administered 2015-12-06 – 2015-12-08 (×4): via INTRAVENOUS

## 2015-12-06 MED ORDER — HEPARIN SODIUM (PORCINE) 5000 UNIT/ML IJ SOLN
5000.0000 [IU] | Freq: Three times a day (TID) | INTRAMUSCULAR | Status: DC
  Administered 2015-12-06 – 2015-12-08 (×6): 5000 [IU] via SUBCUTANEOUS
  Filled 2015-12-06 (×5): qty 1

## 2015-12-06 MED ORDER — ONDANSETRON HCL 4 MG/2ML IJ SOLN
4.0000 mg | Freq: Once | INTRAMUSCULAR | Status: AC
  Administered 2015-12-06: 4 mg via INTRAVENOUS
  Filled 2015-12-06: qty 2

## 2015-12-06 MED ORDER — DAPSONE 100 MG PO TABS
100.0000 mg | ORAL_TABLET | Freq: Every day | ORAL | Status: DC
  Administered 2015-12-07 – 2015-12-08 (×2): 100 mg via ORAL
  Filled 2015-12-06 (×2): qty 1

## 2015-12-06 MED ORDER — FENTANYL CITRATE (PF) 100 MCG/2ML IJ SOLN
25.0000 ug | INTRAMUSCULAR | Status: DC | PRN
  Administered 2015-12-06 – 2015-12-07 (×12): 25 ug via INTRAVENOUS
  Filled 2015-12-06 (×13): qty 2

## 2015-12-06 MED ORDER — DOLUTEGRAVIR SODIUM 50 MG PO TABS
50.0000 mg | ORAL_TABLET | Freq: Every day | ORAL | Status: DC
Start: 2015-12-07 — End: 2015-12-08
  Administered 2015-12-07 – 2015-12-08 (×2): 50 mg via ORAL
  Filled 2015-12-06 (×2): qty 1

## 2015-12-06 NOTE — ED Provider Notes (Signed)
[facility]-EMERGENCY DEPT Provider Note   CSN: [id number] Arrival date & time: 12/06/15  1242     History   Chief Complaint Chief Complaint  Patient presents with  . Abdominal Pain    HPI Kara Allen is a 35 y.o. female with a PMHx of VUR with recurrent UTIs and pyelonephritis, chronic kidney scarring, R ovarian cyst, HIV+, and chronic pelvic pain, and a PSHx of C/S x1, cystoscopy with stent placement in 2015 by Dr. Isabel Caprice, and L salpingo-oophorectomy, who presents to the ED with complaints of ongoing lower abdominal pain that has gradually worsened. Chart review reveals she was seen in the ED on 11/21/15 for complaints of abd pain and back pain, already on levaquin for UTI at that point, had pelvic done that revealed mild scant vaginal discharge and some cervical tenderness but not reproducing her abd/back pain, wet prep revealed +clue cells but otherwise unremarkable, GC/CT testing neg, CT abd/pelv done that day showed chronic kidney scarring/atrophy without new findings, reactive LAD, and R ovarian cyst; she was switched to keflex from levaquin. It was recommended at that time that she be admitted but she didn't have child care so she declined admission.   Patient states that after that ER visit, she had about 1 week where she felt somewhat better, but then approximately 8 days ago she gradually started feeling worse, and yesterday and today her symptoms drastically worsened. She describes her abdominal pain is 10/10 constant sharp pain in the lower abdomen, radiating into the lower back and bilateral flanks although worse on the right, worse with "everything" especially position changes, and mildly improved when she urinates. Unrelieved with Norco, no other treatments tried. Associated symptoms include malodorous urine, increased urinary frequency, nausea, and 3 episodes of nonbloody nonbilious emesis today. She states this feels exactly the same as her prior UTIs and pyelonephritis.  She  denies any fevers, chills, chest pain, shortness breath, hematemesis, melena, hematochezia, diarrhea, constipation, dysuria, hematuria, vaginal bleeding or discharge, numbness, tingling, or focal weakness. LMP was 2 weeks ago. She is sexually active with one female partner, protected with condoms. She denies any recent travel, sick contacts, suspicious food intake, alcohol use, or NSAID use. She states that she is compliant with her HIV medicines, only sees Dr. Orvan Falconer with infectious disease, has no PCP. Chart review reveals that her last CD4 count was 100 on 07/18/15.    The history is provided by the patient and medical records. No language interpreter was used.  Abdominal Pain   This is a recurrent problem. The current episode started more than 1 week ago. The problem occurs constantly. The problem has been gradually worsening. Associated with: recent UTI/recurrent pyelonephritis. The pain is located in the LLQ, RLQ and suprapubic region. The quality of the pain is sharp. The pain is at a severity of 10/10. The pain is severe. Associated symptoms include nausea, vomiting and frequency. Pertinent negatives include fever, diarrhea, hematochezia, melena, constipation, dysuria, hematuria, arthralgias and myalgias. The symptoms are aggravated by certain positions ("everything"). The symptoms are relieved by urination. Past workup includes CT scan. Past medical history comments: VUR, recurrent pyelonephritis, HIV+.    Past Medical History:  Diagnosis Date  . Cyst of ovary    PT STATES SHE IS TAKING ANTIBIOTICS FOR CYST ON OVARY  . HIV (human immunodeficiency virus infection) (HCC)   . Hydronephrosis, left   . Pyelonephritis   . Recurrent boils   . Trichomonas infection   . Urinary frequency    and nocturia  Patient Active Problem List   Diagnosis Date Noted  . Gum inflammation 06/02/2014  . Pelvic pain in female   . Left ovarian cyst   . Back pain   . 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  HIV (human immunodeficiency virus infection) (HCC) 10/01/2013  . Hydronephrosis, left 10/01/2013  . BOILS, RECURRENT 02/12/2008  . ANXIETY 01/30/2006  . CIGARETTE SMOKER 01/30/2006  . Depression 01/30/2006  . Mild cognitive impairment 01/30/2006  . ALLERGIC RHINITIS 01/30/2006  . SYMPTOM, ENURESIS, NOCTURNAL 01/30/2006    Past Surgical History:  Procedure Laterality Date  . CESAREAN SECTION  2006  . CYSTOSCOPY WITH RETROGRADE PYELOGRAM, URETEROSCOPY AND STENT PLACEMENT Bilateral 10/28/2013   Procedure: CYSTOSCOPY WITH RETROGRADE PYELOGRAM, CYSTOGRAM;  Surgeon: Valetta Fuller, MD;  Location: [facility];  Service: Urology;  Laterality: Bilateral;  . LAPAROSCOPIC UNILATERAL SALPINGO OOPHERECTOMY Left 01/05/2014   Procedure: LAPAROSCOPIC LEFT SALPINGO OOPHORECTOMY;  Surgeon: Catalina Antigua, MD;  Location: [facility];  Service: Gynecology;  Laterality: Left;    OB History    Gravida Para Term Preterm AB Living   2 1 1   1 1    SAB TAB Ectopic Multiple Live Births   1               Home Medications    Prior to Admission medications   Medication Sig Start Date End Date Taking? Authorizing Provider  cephALEXin (KEFLEX) 500 MG capsule Take 1 capsule (500 mg total) by mouth 4 (four) times daily. 11/21/15   Pricilla Loveless, MD  dapsone 100 MG tablet Take 1 tablet (100 mg total) by mouth daily. 08/03/15   Cliffton Asters, MD  EDURANT 25 MG TABS tablet TAKE ONE TABLET BY MOUTH EVERY MORNING WITH BREAKFAST Patient not taking: Reported on 11/21/2015 09/08/15   Cliffton Asters, MD  EPIVIR 10 MG/ML solution TAKE 30 MILLILITERS (2 TABLESPOONFULS) BY MOUTH ONCE DAILY 09/08/15   Cliffton Asters, MD  levofloxacin (LEVAQUIN) 750 MG tablet Take 1 tablet (750 mg total) by mouth daily. 11/12/15   Garlon Hatchet, PA-C  metroNIDAZOLE (FLAGYL) 500 MG tablet Take 1 tablet (500 mg total) by mouth 2 (two) times daily. Patient not taking: Reported on 11/21/2015 02/22/15   Rolm Gala Barrett, PA-C  ondansetron (ZOFRAN ODT) 4 MG  disintegrating tablet Take 1 tablet (4 mg total) by mouth every 8 (eight) hours as needed for nausea or vomiting. 11/21/15   Pricilla Loveless, MD  ondansetron (ZOFRAN) 4 MG tablet Take 1 tablet (4 mg total) by mouth every 6 (six) hours. Patient not taking: Reported on 11/21/2015 08/03/15   Cliffton Asters, MD  oxyCODONE (ROXICODONE) 5 MG immediate release tablet Take 1-2 tablets (5-10 mg total) by mouth every 4 (four) hours as needed for severe pain. 11/21/15   Pricilla Loveless, MD  promethazine (PHENERGAN) 25 MG suppository Place 1 suppository (25 mg total) rectally every 6 (six) hours as needed for nausea or vomiting. 11/21/15   Pricilla Loveless, MD  TIVICAY 50 MG tablet TAKE ONE (1) TABLET BY MOUTH EVERY DAY 09/08/15   Cliffton Asters, MD  traMADol (ULTRAM) 50 MG tablet Take 1 tablet (50 mg total) by mouth 2 (two) times daily. Patient not taking: Reported on 11/21/2015 02/22/15   Alveta Heimlich, PA-C    Family History Family History  Problem Relation Age of Onset  . HIV Mother     Social History Social History  Substance Use Topics  . Smoking status: Current Every Day Smoker    Packs/day: 0.10    Years: 14.00  Types: Cigarettes, Cigars  . Smokeless tobacco: Never Used     Comment: smoking 1-2 black and milds per day  . Alcohol use No     Comment:  - does smoke tobacco product called blackmilds     Allergies   Ibuprofen; Latex; Sulfamethoxazole-trimethoprim; and Tylenol [acetaminophen]   Review of Systems Review of Systems  Constitutional: Negative for chills and fever.  Respiratory: Negative for shortness of breath.   Cardiovascular: Negative for chest pain.  Gastrointestinal: Positive for abdominal pain, nausea and vomiting. Negative for blood in stool, constipation, diarrhea, hematochezia and melena.  Genitourinary: Positive for flank pain and frequency. Negative for dysuria, hematuria, vaginal bleeding and vaginal discharge.       +malodorous urine  Musculoskeletal: Negative for  arthralgias and myalgias.  Skin: Negative for color change.  Allergic/Immunologic: Positive for immunocompromised state (HIV+).  Neurological: Negative for weakness and numbness.  Psychiatric/Behavioral: Negative for confusion.   10 Systems reviewed and are negative for acute change except as noted in the HPI.   Physical Exam Updated Vital Signs BP 110/85 (BP Location: Left Arm)   Pulse 104   Temp 98.2 F (36.8 C) (Oral)   Resp 20   Ht 5\' 1"  (1.549 m)   Wt 70.3 kg   LMP 11/01/2015   SpO2 99%   BMI 29.29 kg/m   Physical Exam  Constitutional: She is oriented to person, place, and time. Vital signs are normal. She appears well-developed and well-nourished.  Non-toxic appearance. No distress.  Afebrile, nontoxic, NAD  HENT:  Head: Normocephalic and atraumatic.  Mouth/Throat: Oropharynx is clear and moist and mucous membranes are normal.  Eyes: Conjunctivae and EOM are normal. Right eye exhibits no discharge. Left eye exhibits no discharge.  Neck: Normal range of motion. Neck supple.  Cardiovascular: Normal rate, regular rhythm, normal heart sounds and intact distal pulses.  Exam reveals no gallop and no friction rub.   No murmur heard. HR 90s during exam  Pulmonary/Chest: Effort normal and breath sounds normal. No respiratory distress. She has no decreased breath sounds. She has no wheezes. She has no rhonchi. She has no rales.  Abdominal: Soft. Normal appearance and bowel sounds are normal. She exhibits no distension. There is generalized tenderness. There is CVA tenderness. There is no rigidity, no rebound, no guarding, no tenderness at McBurney's point and negative Murphy's sign.  Soft, nondistended, +BS throughout, with mild diffuse generalized abd TTP more notably in the lower abdomen/suprapubic area, no r/g/r, neg murphy's, neg mcburney's, with +moderate b/l CVA TTP with R>L  Musculoskeletal: Normal range of motion.  Neurological: She is alert and oriented to person, place, and  time. She has normal strength. No sensory deficit.  Skin: Skin is warm, dry and intact. No rash noted.  Psychiatric: She has a normal mood and affect.  Nursing note and vitals reviewed.    ED Treatments / Results  Labs (all labs ordered are listed, but only abnormal results are displayed) Labs Reviewed  COMPREHENSIVE METABOLIC PANEL - Abnormal; Notable for the following:       Result Value   Potassium 3.4 (*)    Total Protein 9.8 (*)    Albumin 3.3 (*)    Anion gap 4 (*)    All other components within normal limits  CBC - Abnormal; Notable for the following:    WBC 3.8 (*)    Platelets 146 (*)    All other components within normal limits  URINALYSIS, ROUTINE W REFLEX MICROSCOPIC - Abnormal; Notable for  the following:    APPearance CLOUDY (*)    Hgb urine dipstick LARGE (*)    Protein, ur 30 (*)    Leukocytes, UA LARGE (*)    Bacteria, UA FEW (*)    Squamous Epithelial / LPF 6-30 (*)    All other components within normal limits  I-STAT CG4 LACTIC ACID, ED - Abnormal; Notable for the following:    Lactic Acid, Venous 2.43 (*)    All other components within normal limits  URINE CULTURE  CULTURE, BLOOD (ROUTINE X 2)  CULTURE, BLOOD (ROUTINE X 2)  LIPASE, BLOOD  I-STAT BETA HCG BLOOD, ED ([facility], [facility], [facility] ONLY)   GC/CT test 11/21/15: negative Wet prep 11/21/15: +clue cells, moderate WBCs, no yeast or trichomonas CD4 count 07/18/15: 100   EKG  EKG Interpretation None       Radiology No results found.   CT abd/pelv w/ contrast 11/21/15 Study Result: CLINICAL DATA:  35 year old female. HIV positive since birth. Generalized but more so lower abdominal pain. Bilateral flank pain. Diagnosed with UTI last week on antibiotics. Initial encounter.  EXAM: CT ABDOMEN AND PELVIS WITH CONTRAST  TECHNIQUE: Multidetector CT imaging of the abdomen and pelvis was performed using the standard protocol following bolus administration of intravenous contrast.  CONTRAST:  1  ISOVUE-300 IOPAMIDOL (ISOVUE-300) INJECTION 61%  COMPARISON:  Pelvis ultrasound and CT Abdomen and Pelvis 02/22/2015 and earlier.  FINDINGS: Lower chest: No pericardial or pleural effusion. Minimal dependent atelectasis or scarring at both lung bases.  No upper abdominal free air.  Hepatobiliary: Negative liver and gallbladder aside from scattered chronic hepatic calcified granulomas.  Pancreas: Chronic prominence of the main pancreatic duct is stable. No peripancreatic inflammation.  Spleen: Negative.  Adrenals/Urinary Tract: Negative adrenal glands.  Chronic abnormal appearance of both kidneys without significant interval change. A constellation of intermittent renal cortical volume loss, enlarged and heterogeneous renal pyramids, as well as left greater than right urothelial thickening again noted. No delayed renal excretory phase images were obtained. No perinephric stranding. Beaded appearance of the proximal left ureter again noted. No periureteral stranding.  Stomach/Bowel: Negative rectum. Intermittent retained stool in the sigmoid colon which otherwise is negative. Decompressed left colon. Negative splenic flexure and transverse colon. Negative right colon. Negative retrocecal appendix. Negative terminal ileum. No dilated small bowel. Negative stomach and duodenum.  Vascular/Lymphatic: Mild chronic mural soft plaque along the distal abdominal aorta (series 2, image 43). Otherwise normal major arterial structures. Portal venous system is patent.  Stable mild retroperitoneal lymphadenopathy mostly about the left kidney with individual nodes up to 16 mm short axis.  Reproductive: Improved appearance of the right adnexa since February, physiologic appearing 2 cm cyst suspected in the ovary at this time. Negative uterus and left adnexa.  Other: Trace pelvic free fluid.  Musculoskeletal: No acute osseous abnormality identified.  IMPRESSION: 1. No  new or acute abnormality in the abdomen or pelvis. 2. Stable CT appearance of chronically abnormal kidneys including left greater than right renal cortical atrophy and chronic urothelial inflammation. 3. Mild chronic retroperitoneal lymphadenopathy is stable and probably is related to #2, or less likely HIV related lymphoproliferative disorder. 4. Probable physiologic trace pelvic free fluid and right ovarian cyst.   Electronically Signed   By: Odessa FlemingH  Hall M.D.   On: 11/21/2015 12:06    Procedures Procedures (including critical care time)  Medications Ordered in ED Medications  cefTRIAXone (ROCEPHIN) 1 g in dextrose 5 % 50 mL IVPB (1 g Intravenous New Bag/Given 12/06/15 1438)  sodium chloride 0.9 % bolus 1,000 mL (1,000 mLs Intravenous New Bag/Given 12/06/15 1417)  morphine 4 MG/ML injection 4 mg (4 mg Intravenous Given 12/06/15 1438)  ondansetron (ZOFRAN) injection 4 mg (4 mg Intravenous Given 12/06/15 1438)     Initial Impression / Assessment and Plan / ED Course  I have reviewed the triage vital signs and the nursing notes.  Pertinent labs & imaging results that were available during my care of the patient were reviewed by me and considered in my medical decision making (see chart for details).  Clinical Course     35 y.o. female here with ongoing lower abd pain and b/l flank pain R>L that has been going on since her last ED visit 11/21/15 but initially improved, then worsened 8 days ago and then got much worse yesterday. Associated with n/v, malodorous urine, and urinary frequency. Had CT abd/pelv last visit which showed chronic kidney scarring without new findings, and reactive LAD and R ovary cyst; wet prep showed clue cells but otherwise unremarkable; GC/CT testing neg. Today, lactic 2.43, pt overall well appearing and doesn't appear septic, VSS and overall unremarkable, so doubt need for calling code sepsis but will send blood cultures. BetaHCG neg. Lipase WNL. CMP with mildly  low K 3.4 and low albumin level but otherwise unremarkable. CBC with WBC 3.8 mildly low but near baseline for pt. U/A cloudy with +leuks, TNTC WBC, +bacteria, and 6-30 squamous; will send for culture. Exam reveals diffuse abd TTP, nonperitoneal, with +CVA TTP bilaterally but R>L. Consistent with pyelonephritis. Will start fluids, give pain meds, zofran, rocephin, and admit for IV abx management of her pyelonephritis in immunocompromised pt with abnormal urinary tract system. Doubt need for pelvic exam or further imaging at this time. Discussed case with my attending Dr. Criss Alvine who agrees with plan.   2:47 PM Dr. Caroleen Hamman of [facility] service returning page and will admit. Holding orders placed. Please see their notes for further documentation of care. I appreciate their help with this pleasant pt's care. Pt stable at time of admission.   Final Clinical Impressions(s) / ED Diagnoses   Final diagnoses:  Pyelonephritis  Elevated lactic acid level  Lower abdominal pain  Flank pain    New Prescriptions New Prescriptions   No medications on file     Allen Derry, PA-C 12/06/15 1447    Pricilla Loveless, MD 12/06/15 1503

## 2015-12-06 NOTE — H&P (Signed)
[facility] History and Physical Service Pager: [phone]  Patient name: Kara Allen Medical record number: [id number] Date of birth: 09/17/1980 Age: 35 y.o. Gender: female  Primary Care Provider: No PCP Per Patient Consultants: None Code Status: Full code  Chief Complaint: abdominal and back pain  Assessment and Plan: Kara Allen is a 35 y.o. female presenting with flank pain and RUQ pain. PMH is significant for HIV and recurrent UTIs with VUR, anxiety/depression, mild cognitive impairment.   Flank/abdominal pain: Suspect secondary to Pyelonephritis, but differential includes Cholecystitis. 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  Pain continues to be 8 out of 10 after receiving 4 mg morphine in the ED and reportedly passed out from pain en route to the ED.  Exam revealed CVA tenderness on the right side and positive Murphy's sign.  UA was contaminated.  Patient afebrile and vital signs are stable. Pregnancy test negative. Lactic acid 2.43 upon presentation and improved to 0.75 after 1L NS bolus. WBC 3.8, which is about her baseline. QSOFA is 1 and did not meet SIRS criteria.  - Place in Observation, Dr. Randolm IdolFletke attending - NPO pending imaging. If no surgery indicated, initiate diet.  - Follow-up blood cultures - LFTs, lipase - CT abdomen with and without contrast - Right upper quadrant ultrasound - NS@110cc /hr - CTX (12/5>>) - Pain control with the 5 g fentanyl q2hrs as needed  HIV positive: Reportedly follows with Dr. Orvan Falconerampbell and states that she is compliant with her medications and patient has no PCP.  Last CD4 count was 100 on 7/17.  Last seen in clinic on 08/03/15 and patient was missing 2 doses in the 2 weeks prior.  Denies any fevers, weight loss, cough or recent illnesses.  - Continue tivicay 50mg  daily, epivir 300mg  daily and edurant 25mg  daily -  Dapsone 100mg  daily for Pneumocystis prophylaxis. - Repeat CD4 count and viral loads - Touch base with ID  Hypokalemia: Potassium 3.4 today.  - Repleted - Continue to monitor on daily BMP  Recurrent UTI with vesico-ureteral reflex:  Complex history of multiple UTI and VUR with prior stent placement by Urologist Dr. Isabel CapriceGrapey.  CT abd on 11/21/15 showed stable CT appearance of chronically abnormal kidneys including left greater than right renal cortical atrophy and chronic urothelial inflammation.  - Will need outpatient follow up with Urology  Anxiety/depression: Not currently medicated.  - Consider GAD7 and PHQ2 in AM  FEN/GI: NPO/MIVF @110cc /hr NS Prophylaxis: subQ heparin  Disposition: admit to FPTS under attending Fletke  History of Present Illness:  Kara PresserSharon D Sarinana is a 35 y.o. female presenting with abdominal and back pain.  She reports off an on again abdominal and flank pain for the past 3-4 years. Was seen last week in the ED for similar picture and was recommended for admission with concern for pyelonephritis, but patient was unable to get babysitter and went home. Denies any fevers, but does endorse some chills when the pain is bad. Patient reportedly passed out in car on the way to the hospital due to pain.  Pain is rated 8/10 currently after receiving 4mg  morphine.  Endorsed nausea and vomiting today x3.  Denies any diarrhea and her last BM was today.  Has been able to eat without difficulties. No dysuria or other urinary symptoms.   In the ED patient was given 4mg  morphine, zofran for nausea and ceftriaxone.  Also received 1L bolus of  NS.  UA showed large leukocyte esterase, large hgb and had cloudy appearance, however had 6-30 squams.   Review Of Systems: Per HPI with the following additions:   ROS  Patient Active Problem List   Diagnosis Date Noted  . Pyelonephritis 12/06/2015  . Gum inflammation 06/02/2014  . Pelvic pain in female   . Left ovarian cyst   . Back pain    . 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 HIV (human immunodeficiency virus infection) (HCC) 10/01/2013  . Hydronephrosis, left 10/01/2013  . BOILS, RECURRENT 02/12/2008  . ANXIETY 01/30/2006  . CIGARETTE SMOKER 01/30/2006  . Depression 01/30/2006  . Mild cognitive impairment 01/30/2006  . ALLERGIC RHINITIS 01/30/2006  . SYMPTOM, ENURESIS, NOCTURNAL 01/30/2006    Past Medical History: Past Medical History:  Diagnosis Date  . Cyst of ovary    PT STATES SHE IS TAKING ANTIBIOTICS FOR CYST ON OVARY  . HIV (human immunodeficiency virus infection) (HCC)   . Hydronephrosis, left   . Pyelonephritis   . Recurrent boils   . Trichomonas infection   . Urinary frequency    and nocturia    Past Surgical History: Past Surgical History:  Procedure Laterality Date  . CESAREAN SECTION  2006  . CYSTOSCOPY WITH RETROGRADE PYELOGRAM, URETEROSCOPY AND STENT PLACEMENT Bilateral 10/28/2013   Procedure: CYSTOSCOPY WITH RETROGRADE PYELOGRAM, CYSTOGRAM;  Surgeon: Valetta Fuller, MD;  Location: [facility];  Service: Urology;  Laterality: Bilateral;  . LAPAROSCOPIC UNILATERAL SALPINGO OOPHERECTOMY Left 01/05/2014   Procedure: LAPAROSCOPIC LEFT SALPINGO OOPHORECTOMY;  Surgeon: Catalina Antigua, MD;  Location: [facility];  Service: Gynecology;  Laterality: Left;    Social History: Social History  Substance Use Topics  . Smoking status: Current Every Day Smoker    Packs/day: 0.10    Years: 14.00    Types: Cigarettes, Cigars  . Smokeless tobacco: Never Used     Comment: smoking 1-2 black and milds per day  . Alcohol use No     Comment:  - does smoke tobacco product called blackmilds   Additional social history:  Please also refer to relevant sections of EMR.  Family History: Family History  Problem Relation Age of Onset  . HIV Mother     Allergies and Medications: Allergies  Allergen Reactions  . Ibuprofen Rash and Other (See Comments)    "Irritates my bladder"  . Latex Other (See Comments)    If pt  wears latex gloves or any contact with latex causes itching and rash. Some tapes also cause itching, skin irritation - paper tape seems to be okay  . Sulfamethoxazole-Trimethoprim Other (See Comments)    Rash, chills and hives  . Tylenol [Acetaminophen] Other (See Comments)    Makes me pee more- irritates bladder   No current facility-administered medications on file prior to encounter.    Current Outpatient Prescriptions on File Prior to Encounter  Medication Sig Dispense Refill  . cephALEXin (KEFLEX) 500 MG capsule Take 1 capsule (500 mg total) by mouth 4 (four) times daily. 40 capsule 0  . dapsone 100 MG tablet Take 1 tablet (100 mg total) by mouth daily. 30 tablet 11  . EDURANT 25 MG TABS tablet TAKE ONE TABLET BY MOUTH EVERY MORNING WITH BREAKFAST (Patient not taking: Reported on 11/21/2015) 30 tablet 2  . EPIVIR 10 MG/ML solution TAKE 30 MILLILITERS (2 TABLESPOONFULS) BY MOUTH ONCE DAILY 960 mL 2  . levofloxacin (LEVAQUIN) 750 MG tablet Take 1 tablet (750 mg total) by mouth daily. 7 tablet 0  . metroNIDAZOLE (FLAGYL)  500 MG tablet Take 1 tablet (500 mg total) by mouth 2 (two) times daily. (Patient not taking: Reported on 11/21/2015) 14 tablet 0  . ondansetron (ZOFRAN ODT) 4 MG disintegrating tablet Take 1 tablet (4 mg total) by mouth every 8 (eight) hours as needed for nausea or vomiting. 10 tablet 0  . ondansetron (ZOFRAN) 4 MG tablet Take 1 tablet (4 mg total) by mouth every 6 (six) hours. (Patient not taking: Reported on 11/21/2015) 30 tablet 5  . oxyCODONE (ROXICODONE) 5 MG immediate release tablet Take 1-2 tablets (5-10 mg total) by mouth every 4 (four) hours as needed for severe pain. 10 tablet 0  . promethazine (PHENERGAN) 25 MG suppository Place 1 suppository (25 mg total) rectally every 6 (six) hours as needed for nausea or vomiting. 6 each 0  . TIVICAY 50 MG tablet TAKE ONE (1) TABLET BY MOUTH EVERY DAY 30 tablet 2  . traMADol (ULTRAM) 50 MG tablet Take 1 tablet (50 mg total) by  mouth 2 (two) times daily. (Patient not taking: Reported on 11/21/2015) 8 tablet 0    Objective: BP 110/85 (BP Location: Left Arm)   Pulse 104   Temp 98.2 F (36.8 C) (Oral)   Resp 20   Ht 5\' 1"  (1.549 m)   Wt 155 lb (70.3 kg)   LMP 11/01/2015   SpO2 99%   BMI 29.29 kg/m  Exam: General: 35yo F sitting up in bed appearing comfortable and in NAD Eyes: non-injected, EOMI, PERRL ENTM: clear TMs, MMM Neck: supple, enlarged posterior cervical nodes  Cardiovascular: RRR, no murmurs, palpable pulses Respiratory: NWOB, CTABL, no wheezing or rhonci Gastrointestinal: soft, moderately tender RUQ with + murphys sign and generalized tenderness, but worse in the lower abdomen and  CVA tenderness on R side. Negative Rebound. Negative Rovsings.  No guarding or peritoneal signs.  MSK: no deformities, moves all extremities Derm: warm and dry Neuro: no gross focal neurological deficits Psych: Normal mood and affect  Labs and Imaging: CBC BMET   Recent Labs Lab 12/06/15 1307  WBC 3.8*  HGB 12.7  HCT 38.2  PLT 146*    Recent Labs Lab 12/06/15 1307  NA 136  K 3.4*  CL 108  CO2 24  BUN 11  CREATININE 0.63  GLUCOSE 98  CALCIUM 9.1    - Urinalysis: Cloudy, few bacteria, large hemoglobin, large leukocytes, 6-30 squamous epithelial cells - Lactic Acid 2.43> 0.75 - Pregnancy negative  Kara Muscaaniel L Warden, MD 12/06/2015, 3:25 PM PGY-1, [facility] FPTS Intern pager: [phone], text pages welcome  Upper Level Addendum:  I have seen and evaluated this patient along with Dr. Myrtie SomanWarden and reviewed the above note, making necessary revisions in blue.   Dr. Garry Heateraleigh Kyliah Deanda, DO, PGY3 12/06/2015; 11:13 PM

## 2015-12-06 NOTE — ED Notes (Signed)
Lab called Lactic Acid 2.43. Placed pt in room E39 and notified Dr. Criss AlvineGoldston.

## 2015-12-06 NOTE — ED Notes (Signed)
Pt ambulated to restroom. 

## 2015-12-06 NOTE — ED Triage Notes (Signed)
Pt sts lower abd and back pain x 4 days with hx of same with kidney infection; pt sts N/V

## 2015-12-07 ENCOUNTER — Observation Stay (HOSPITAL_COMMUNITY): Payer: Medicaid Other

## 2015-12-07 DIAGNOSIS — F17210 Nicotine dependence, cigarettes, uncomplicated: Secondary | ICD-10-CM | POA: Diagnosis present

## 2015-12-07 DIAGNOSIS — R198 Other specified symptoms and signs involving the digestive system and abdomen: Secondary | ICD-10-CM

## 2015-12-07 DIAGNOSIS — R109 Unspecified abdominal pain: Secondary | ICD-10-CM | POA: Diagnosis present

## 2015-12-07 DIAGNOSIS — Z886 Allergy status to analgesic agent status: Secondary | ICD-10-CM | POA: Diagnosis not present

## 2015-12-07 DIAGNOSIS — R1030 Lower abdominal pain, unspecified: Secondary | ICD-10-CM | POA: Diagnosis not present

## 2015-12-07 DIAGNOSIS — R7989 Other specified abnormal findings of blood chemistry: Secondary | ICD-10-CM | POA: Diagnosis not present

## 2015-12-07 DIAGNOSIS — D899 Disorder involving the immune mechanism, unspecified: Secondary | ICD-10-CM | POA: Diagnosis present

## 2015-12-07 DIAGNOSIS — F419 Anxiety disorder, unspecified: Secondary | ICD-10-CM | POA: Diagnosis present

## 2015-12-07 DIAGNOSIS — R740 Nonspecific elevation of levels of transaminase and lactic acid dehydrogenase [LDH]: Secondary | ICD-10-CM | POA: Diagnosis present

## 2015-12-07 DIAGNOSIS — R1011 Right upper quadrant pain: Secondary | ICD-10-CM | POA: Diagnosis not present

## 2015-12-07 DIAGNOSIS — E876 Hypokalemia: Secondary | ICD-10-CM | POA: Diagnosis present

## 2015-12-07 DIAGNOSIS — Z882 Allergy status to sulfonamides status: Secondary | ICD-10-CM | POA: Diagnosis not present

## 2015-12-07 DIAGNOSIS — Z79891 Long term (current) use of opiate analgesic: Secondary | ICD-10-CM | POA: Diagnosis not present

## 2015-12-07 DIAGNOSIS — N12 Tubulo-interstitial nephritis, not specified as acute or chronic: Secondary | ICD-10-CM | POA: Diagnosis present

## 2015-12-07 DIAGNOSIS — R569 Unspecified convulsions: Secondary | ICD-10-CM | POA: Diagnosis present

## 2015-12-07 DIAGNOSIS — R590 Localized enlarged lymph nodes: Secondary | ICD-10-CM | POA: Diagnosis present

## 2015-12-07 DIAGNOSIS — Z91040 Latex allergy status: Secondary | ICD-10-CM | POA: Diagnosis not present

## 2015-12-07 DIAGNOSIS — Z888 Allergy status to other drugs, medicaments and biological substances status: Secondary | ICD-10-CM | POA: Diagnosis not present

## 2015-12-07 DIAGNOSIS — Z21 Asymptomatic human immunodeficiency virus [HIV] infection status: Secondary | ICD-10-CM | POA: Diagnosis present

## 2015-12-07 DIAGNOSIS — Z79899 Other long term (current) drug therapy: Secondary | ICD-10-CM | POA: Diagnosis not present

## 2015-12-07 DIAGNOSIS — F329 Major depressive disorder, single episode, unspecified: Secondary | ICD-10-CM | POA: Diagnosis present

## 2015-12-07 DIAGNOSIS — G8929 Other chronic pain: Secondary | ICD-10-CM | POA: Diagnosis present

## 2015-12-07 LAB — COMPREHENSIVE METABOLIC PANEL
ALK PHOS: 51 U/L (ref 38–126)
ALT: 14 U/L (ref 14–54)
ANION GAP: 9 (ref 5–15)
AST: 20 U/L (ref 15–41)
Albumin: 2.6 g/dL — ABNORMAL LOW (ref 3.5–5.0)
BUN: 7 mg/dL (ref 6–20)
CALCIUM: 8.5 mg/dL — AB (ref 8.9–10.3)
CO2: 20 mmol/L — AB (ref 22–32)
CREATININE: 0.54 mg/dL (ref 0.44–1.00)
Chloride: 106 mmol/L (ref 101–111)
Glucose, Bld: 89 mg/dL (ref 65–99)
Potassium: 3.4 mmol/L — ABNORMAL LOW (ref 3.5–5.1)
SODIUM: 135 mmol/L (ref 135–145)
Total Bilirubin: 0.4 mg/dL (ref 0.3–1.2)
Total Protein: 7.3 g/dL (ref 6.5–8.1)

## 2015-12-07 LAB — URINALYSIS, ROUTINE W REFLEX MICROSCOPIC
Bacteria, UA: NONE SEEN
Bilirubin Urine: NEGATIVE
GLUCOSE, UA: NEGATIVE mg/dL
KETONES UR: NEGATIVE mg/dL
Nitrite: NEGATIVE
PH: 7 (ref 5.0–8.0)
Protein, ur: 30 mg/dL — AB

## 2015-12-07 LAB — URINE CULTURE: CULTURE: NO GROWTH

## 2015-12-07 LAB — T-HELPER CELLS (CD4) COUNT (NOT AT ARMC)
CD4 T CELL ABS: 140 /uL — AB (ref 400–2700)
CD4 T CELL HELPER: 19 % — AB (ref 33–55)

## 2015-12-07 MED ORDER — ONDANSETRON HCL 4 MG/2ML IJ SOLN
4.0000 mg | Freq: Once | INTRAMUSCULAR | Status: AC
  Administered 2015-12-07: 4 mg via INTRAVENOUS
  Filled 2015-12-07: qty 2

## 2015-12-07 MED ORDER — ONDANSETRON HCL 4 MG/2ML IJ SOLN
4.0000 mg | Freq: Four times a day (QID) | INTRAMUSCULAR | Status: DC | PRN
  Administered 2015-12-07 (×2): 4 mg via INTRAVENOUS
  Filled 2015-12-07: qty 2

## 2015-12-07 MED ORDER — ONDANSETRON HCL 4 MG/2ML IJ SOLN
4.0000 mg | Freq: Four times a day (QID) | INTRAMUSCULAR | Status: DC | PRN
  Filled 2015-12-07: qty 2

## 2015-12-07 MED ORDER — IOPAMIDOL (ISOVUE-300) INJECTION 61%
INTRAVENOUS | Status: AC
  Administered 2015-12-07: 100 mL
  Filled 2015-12-07: qty 100

## 2015-12-07 MED ORDER — POTASSIUM CHLORIDE CRYS ER 20 MEQ PO TBCR
40.0000 meq | EXTENDED_RELEASE_TABLET | Freq: Once | ORAL | Status: AC
  Administered 2015-12-07: 40 meq via ORAL
  Filled 2015-12-07: qty 2

## 2015-12-07 MED ORDER — TRAMADOL HCL 50 MG PO TABS
50.0000 mg | ORAL_TABLET | Freq: Four times a day (QID) | ORAL | Status: DC | PRN
  Administered 2015-12-07 – 2015-12-08 (×4): 50 mg via ORAL
  Filled 2015-12-07 (×4): qty 1

## 2015-12-07 MED ORDER — PNEUMOCOCCAL VAC POLYVALENT 25 MCG/0.5ML IJ INJ: 0.5000 mL | INJECTION | INTRAMUSCULAR | Status: DC

## 2015-12-07 NOTE — Discharge Summary (Signed)
[facility]  Patient name: Kara Allen Medical record number: [id number] Date of birth: 06/15/1980 Age: 35 y.o. Gender: female Date of Admission: 12/06/2015  Date of Discharge: 12/08/2015 Admitting Physician: Uvaldo RisingKyle J Fletke, MD  Primary Care Provider: No PCP Per Patient Consultants: Surgery  Indication for Hospitalization: Flank pain  Discharge Diagnoses/Problem List:  Active Problems:   Pyelonephritis   Elevated lactic acid level   Flank pain   Lower abdominal pain   Positive Murphy's Sign   Right upper quadrant abdominal pain    Disposition: Discharge home  Discharge Condition: Stable, improved  Discharge Exam:  General: 35yo F sitting up in bed appearing comfortable and in NAD ENTM: MMM Cardiovascular: RRR, no murmurs, palpable pulses Respiratory: NWOB, CTABL, no wheezing or rhonci Gastrointestinal: soft, mildly tender RUQ and improved CVA tenderness, no peritoneal signs or guarding MSK: no deformities, moves all extremities Derm: warm and dry Neuro: no gross focal neurological deficits Psych: Normal mood and affect  Brief Hospital Course:  35 year old female with past medical history of HIV, VUR, recurrent UTIs admitted with acute on chronic right-sided abdominal/flank/back pain.  In the ED she had a UA that was suggestive of UTI, however this was contaminated urine and was started on Keflex. Initial differential included pyelonephritis or cholecystitis, as the patient had a positive Murphy's sign.  She was started on maintenance fluids at 110 mL and was made nothing by mouth and started on fentanyl 5 g every 2 hours. Lactic acid was initially elevated at 2.43 and improved to 0.75 after 1 L normal saline bolus. Her blood cells were initially 3.8 which is about her baseline. QSOFA was 1 and she did not meet SIRS criteria.  Additionally she was started on ceftriaxone.  LFTs and lipase were within normal limits and CT abdomen  showed no acute changes and stable chronic scarring.  She had right upper quadrant ultrasound that showed small gallstones versus sludge and she had a HIDA scan that was normal. She be seen by surgery who signed off after noting normal HIDA scan. Patient also has history of HIV and was continued on her daily home medication Epivir 300 mg daily, endurant 25 mg daily and ticavay 50mg  daily. Discussed patient with Dr. Orvan Falconerampbell who is her ID doctor and he recommended continuing dapsome and rechecking her viral load and CD4 count.  CD4 count showed 240 and viral load was 9830.  She was transitioned over to tramadol and her pain remained well controlled throughout her hospitalization.  She continued to show improvement with ceftriaxone and tramadol and was discharged a course of ceftriaxone to finish 14 days total and tramadol for pain. She was encouraged to follow up with Dr. Orvan Falconerampbell in infectious diseases and [facility].  She was provided with all of these phone numbers.   Issues for Follow Up:  1. Flank pain/pyelonephritis: Continue ceftriaxone for total 14 day course.  Tramadol for pain. Encouraged to follow up with Urologist Dr. Isabel CapriceGrapey who she had previously seen. 2. HIV: Patient had CD4 count of 140 and viral load 9830. Discussed with Dr. Orvan Falconerampbell who stated that patient needs to follow up. I attempted to schedule appointment for this patient however was unable to get through to his office. But I provided her with the phone number to make an appointment.   Significant Procedures: CT abdomen, where quadrant ultrasound, HIDA scan (see below for results)  Significant Labs and Imaging:  Recent Labs Lab 12/06/15 1307 12/08/15 0433 12/08/15 1351  WBC 3.8* 1.9* 2.1*  HGB 12.7 9.6* 9.2*  HCT 38.2 28.9* 27.8*  PLT 146* 136* 128*    Recent Labs Lab 12/06/15 1307 12/06/15 1905 12/07/15 0524 12/08/15 0433  NA 136  --  135 135  K 3.4*  --  3.4*  3.4*  CL 108  --  106 109  CO2 24  --  20* 21*  GLUCOSE 98  --  89 83  BUN 11  --  7 5*  CREATININE 0.63  --  0.54 0.62  CALCIUM 9.1  --  8.5* 8.4*  ALKPHOS 65 56 51  --   AST 26 23 20   --   ALT 17 15 14   --   ALBUMIN 3.3* 2.9* 2.6*  --     Results/Tests Pending at Time of Discharge: None  Discharge Medications:    Medication List    TAKE these medications   cephALEXin 500 MG capsule Commonly known as:  KEFLEX Take 1 capsule (500 mg total) by mouth 4 (four) times daily.   dapsone 100 MG tablet Take 1 tablet (100 mg total) by mouth daily.   docusate sodium 100 MG capsule Commonly known as:  COLACE Take 1 capsule (100 mg total) by mouth 2 (two) times daily.   EDURANT 25 MG Tabs tablet Generic drug:  rilpivirine TAKE ONE TABLET BY MOUTH EVERY MORNING WITH BREAKFAST   EPIVIR 10 MG/ML solution Generic drug:  lamiVUDine TAKE 30 MILLILITERS (2 TABLESPOONFULS) BY MOUTH ONCE DAILY   ondansetron 4 MG disintegrating tablet Commonly known as:  ZOFRAN ODT Take 1 tablet (4 mg total) by mouth every 8 (eight) hours as needed for nausea or vomiting.   TIVICAY 50 MG tablet Generic drug:  dolutegravir TAKE ONE (1) TABLET BY MOUTH EVERY DAY   traMADol 50 MG tablet Commonly known as:  ULTRAM Take 1 tablet (50 mg total) by mouth every 6 (six) hours as needed for moderate pain.       Discharge Instructions: Please refer to Patient Instructions section of EMR for full details.  Patient was counseled important signs and symptoms that should prompt return to medical care, changes in medications, dietary instructions, activity restrictions, and follow up appointments.   Follow-Up Appointments:   Renne Muscaaniel L Lazara Grieser, MD 12/10/2015, 3:07 PM PGY-1, [facility]

## 2015-12-07 NOTE — Progress Notes (Signed)
At around 8:00pm, 12/06/15, pt was given Fentanyl for pain. 3/4th of the vial needed to be wasted. Upon pulling the pyxis to waste med with another RN, ([employer]), it was observed that the pyxis had not registered the pulling out of the medication, so I could not waste med in the pyxis. The Pyxis machine had another RN's waste pulled up, but the RN did not give the medication.  However, the rest of the medication in my hand was wasted with the [employer] [place] as a witness to this waste. Pharmacy was also notified. They said it was okay for me to put in a note as long as the count was right and the pt got the medicine. Will continue to monitor.  At about 12:30am prior to pt going for CT Scan, pt reported that her pain was not responding to the Fentanyl 25mg . MD was notified and she came on the floor to assess and speak with the pt.Will continue to monitor.

## 2015-12-07 NOTE — Care Management Obs Status (Deleted)
MEDICARE OBSERVATION STATUS NOTIFICATION   Patient Details  Name: Volney PresserSharon D Paige MRN: [id number] Date of Birth: 07/03/1980   Medicare Observation Status Notification Given:  Yes    Lawerance Sabalebbie Brigitta Pricer, RN 12/07/2015, 1:47 PM

## 2015-12-07 NOTE — Care Management Note (Signed)
Case Management Note  Patient Details  Name: Kara Allen MRN: [id number] Date of Birth: 09/03/1980  Subjective/Objective:                 Independent patient from home, recently moved in with her sister. Patient instructed on how to obtain PCP through Medicaid. [facility]. Patient states she does not have any barriers to getting to appointments or obtaining medication. No CM needs identified at this time, will continue to follow.    Action/Plan:  Anticipate DC to home, self care when medically clear.   Expected Discharge Date:                  Expected Discharge Plan:  Home/Self Care  In-House Referral:     Discharge planning Services  CM Consult  Post Acute Care Choice:    Choice offered to:     DME Arranged:    DME Agency:     HH Arranged:    HH Agency:     Status of Service:  Completed, signed off  If discussed at [employer] Length of Stay Meetings, dates discussed:    Additional Comments:  Lawerance SabalDebbie Nimo Verastegui, RN 12/07/2015, 12:08 PM

## 2015-12-07 NOTE — Progress Notes (Signed)
Family Medicine Teaching Service Daily Progress Note Intern Pager: [phone]  Patient name: Kara Allen Medical record number: [id number] Date of birth: 07/08/1980 Age: 35 y.o. Gender: female  Primary Care Provider: No PCP Per Patient Consultants: None Code Status: Full  Pt Overview and Major Events to Date:  1. Admit to FMTS  Assessment and Plan: Kara Allen is a 35 y.o. female presenting with flank pain and RUQ pain. PMH is significant for HIV and recurrent UTIs with VUR, anxiety/depression, mild cognitive impairment.   Flank/abdominal pain: Suspect secondary to Pyelonephritis, but differential includes Cholecystitis. 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  Pain continues to be 8 out of 10 after receiving 4 mg morphine in the ED and reportedly passed out from pain en route to the ED.  Exam revealed CVA tenderness on the right side and positive Murphy's sign.  UA was contaminated.  Patient afebrile and vital signs are stable. Pregnancy test negative. Lactic acid 2.43 upon presentation and improved to 0.75 after 1L NS bolus. WBC 3.8, which is about her baseline. QSOFA is 1 and did not meet SIRS criteria.  LFT normal, lipase normal. CT abd no changes since last imaging.  Consulted surgery regarding RUQ us findings and recommended HIDA scan and they will plan to see her.  - Follow-up blood cultures - NPO for HIDA scan - surgery consulted; appreciate assistance - NS@110cc /hr cont until tolerating PO - CTX (12/5>>) - Pain control with the 5 g fentanyl q2hrs as needed and tramadol  HIV positive: Reportedly follows with Dr. Orvan Falconerampbell and states that she is compliant with her medications and patient has no PCP.  Last CD4 count was 100 on 7/17.  Last seen in clinic on 08/03/15 and patient was missing 2 doses in the 2 weeks prior.  Denies any fevers, weight loss, cough or recent illnesses.  - Continue tivicay  50mg  daily, epivir 300mg  daily and edurant 25mg  daily - Dapsone 100mg  daily for Pneumocystis prophylaxis. - Repeat CD4 count and viral loads - Touch base with ID  Hypokalemia: Potassium 3.4 today.  - Repleted - Continue to monitor on daily BMP  Recurrent UTI with vesico-ureteral reflex:  Complex history of multiple UTI and VUR with prior stent placement by Urologist Dr. Isabel CapriceGrapey.  CT abd on 11/21/15 showed stable CT appearance of chronically abnormal kidneys including left greater than right renal cortical atrophy and chronic urothelial inflammation.  - Will need outpatient follow up with Urology  Anxiety/depression: Not currently medicated.  - Consider GAD7 and PHQ2 in AM  FEN/GI: regular diet Prophylaxis: subQ heparin  Disposition: discharge w/ abx once stable  Subjective:  Feels much better this morning.  Addition of tramadol has helped with pain.  Denies any NVD.  Denies CP or SOB.   Objective: Temp:  [97.8 F (36.6 C)-98.2 F (36.8 C)] 97.9 F (36.6 C) (12/06 0521) Pulse Rate:  [66-104] 76 (12/06 0521) Resp:  [14-25] 20 (12/06 0521) BP: (90-110)/(55-94) 104/63 (12/06 0521) SpO2:  [97 %-100 %] 100 % (12/06 0521) Weight:  [155 lb (70.3 kg)] 155 lb (70.3 kg) (12/05 1248) Physical Exam: General: 35yo F sitting up in bed appearing comfortable and in NAD Eyes: non-injected, EOMI, PERRL ENTM: clear TMs, MMM Neck: supple, enlarged posterior cervical nodes  Cardiovascular: RRR, no murmurs, palpable pulses Respiratory: NWOB, CTABL, no wheezing or rhonci Gastrointestinal: soft, moderately tender RUQ with + murphys sign and generalized tenderness, but worse in the lower  abdomen and  CVA tenderness on R side. Negative Rebound. Negative Rovsings.  No guarding or peritoneal signs.  MSK: no deformities, moves all extremities Derm: warm and dry Neuro: no gross focal neurological deficits Psych: Normal mood and affect  Laboratory:  Recent Labs Lab 12/06/15 1307  WBC 3.8*  HGB  12.7  HCT 38.2  PLT 146*    Recent Labs Lab 12/06/15 1307 12/06/15 1905 12/07/15 0524  NA 136  --  135  K 3.4*  --  3.4*  CL 108  --  106  CO2 24  --  20*  BUN 11  --  7  CREATININE 0.63  --  0.54  CALCIUM 9.1  --  8.5*  PROT 9.8* 8.4* 7.3  BILITOT 0.5 0.4 0.4  ALKPHOS 65 56 51  ALT 17 15 14   AST 26 23 20   GLUCOSE 98  --  89    Imaging/Diagnostic Tests: Ct Abdomen Pelvis W Contrast  Result Date: 12/07/2015 CLINICAL DATA:  Abdominal pain with positive Murphy's sign. EXAM: CT ABDOMEN AND PELVIS WITH CONTRAST TECHNIQUE: Multidetector CT imaging of the abdomen and pelvis was performed using the standard protocol following bolus administration of intravenous contrast. CONTRAST:  100 cc Isovue-300 IV COMPARISON:  Most recent CT 11/21/2015 FINDINGS: Lower chest: No acute abnormality.  No pleural fluid. Hepatobiliary: Calcified hepatic granuloma. No focal hepatic lesion. Gallbladder physiologically distended without calcified gallstone or pericholecystic inflammation. No biliary dilatation. Pancreas: Stable chronic pancreatic ductal prominence. No peripancreatic inflammation. Spleen: Normal in size without focal abnormality. Adrenals/Urinary Tract: No adrenal nodule. No hydronephrosis or perinephric edema. Cortical scarring in the left greater than right kidney with chronically dilated in left greater than right renal calices. Chronic right ureteral dilatation and enhancement, unchanged. No urolithiasis. Bladder minimally distended. Stomach/Bowel: Stomach is physiologically distended. No small or large bowel inflammation or distention. Normal appendix. Vascular/Lymphatic: Retroperitoneal lymphadenopathy is unchanged. Largest node left periaortic at the level of the kidney measures 16 mm short axis, unchanged. Mild plaque of the distal abdominal aorta again seen. Prominent bilateral external iliac nodes are unchanged. Reproductive: Uterus oriented in the right pelvis. No evidence of adnexal mass.  Other: No free air or free fluid. Musculoskeletal: There are no acute or suspicious osseous abnormalities. IMPRESSION: 1. No acute abnormality or change from prior exam. 2. Normal CT appearance of the gallbladder and biliary tree. 3. Chronic findings are stable. Electronically Signed   By: Rubye OaksMelanie  Ehinger M.D.   On: 12/07/2015 03:18   [place] Abdomen Limited Ruq  Result Date: 12/07/2015 CLINICAL DATA:  Right upper quadrant pain EXAM: US ABDOMEN LIMITED - RIGHT UPPER QUADRANT COMPARISON:  CT from earlier in the same day. FINDINGS: Gallbladder: Gallbladder is well distended without gallbladder wall thickening or pericholecystic fluid. A few small echogenic foci are noted without significant shadowing. These may represent tiny gallstones or tumefactive sludge. Common bile duct: Diameter: 4 mm. Liver: Scattered calcified granulomas are noted similar to that seen on recent CT. No mass lesion is noted. IMPRESSION: Small gallstones versus tumefactive sludge. Electronically Signed   By: Alcide CleverMark  Lukens M.D.   On: 12/07/2015 08:42    Renne Muscaaniel L Any Mcneice, MD 12/07/2015, 8:50 AM PGY-1, [facility] FPTS Intern pager: [phone], text pages welcome

## 2015-12-07 NOTE — Progress Notes (Signed)
Called to evaluate patient for continued abdominal pain. Patient with questions about vesciouretal reflux vs UTI vs pyelo, explained these. Explained that pain will not be completely gone regardless of what medicine I give her. Rx'd tramadol q6H PRN as she was having breakthrough pain 45 min after fentanyl dose. Due for fentanyl dose when I went to examine her. Also rx'd K pad. Unfortunately, patient ate after CT scan, so she will have to wait additional time prior to RUQ US.

## 2015-12-07 NOTE — Consult Note (Signed)
Reason for Consult: abdominal pain with possible cholecystitis Referring Physician: Raul Del is an 35 y.o. female.  HPI: Pt reports abdominal pain on and off for 3 years. Pt with 3 visits to the ED with abdominal pain since 11/12/15. Hx of vesicoureteral reflux with recurrent UTIs, followed by Dr. Jocelyn Lamer. She has been treated for trichomonas, urine culture grew out multiple species, she was treated with Zofran and Phenergan on the second visit. She could not stay after the last evaluation because of child care issues.   She presented again yesterday 12/06/15 with lower abdominal and back pain for 4 days, a history of kidney infections, nausea and vomiting x 3 yesterday. Pain was reported in the left upper quadrant and right upper quadrant along with the suprapubic region. Pain comes and goes, 3 of 4 weeks per month.  Nothing makes it better or worse.  It is not related to PO intake.  It can occur with or without eating; it can occur with drinking water. She also notes she fill easily at times, and other times she can eat a fairly large portion.  Patient reportedly passed out in the car on the way to the hospital due to pain.  She was offered admission while last visit but was unable to obtain childcare.  She is now admitted by Medicine with concerns over pyelonephritis. Despite initiation of antibiotics she continues to be severely symptomatic. Urine culture still pending.   Workup in the emergency room yesterday reveals she is afebrile. Vital signs are stable blood pressures and 90/55-71 range. Admission labs shows an essentially normal CMP. Lactate was 2.43. Lipase was 19.   WBC is 3.8 UA showed TNTC RBCs and WBCs. Specific gravity greater than 1.046. 6-30 squamous cells per high-powered field; WBC TNTC; RBC TNTC. Wet prep shows clue cells and WBC but was otherwise negative.  CT yesterday shows:  A normal liver, gallbladder, and biliary tree.  chronic prominence to being pancreatic duct  but otherwise normal pancreas. Bilateral chronic renal abnormalities. No new or acute abnormal findings of the abdomen or pelvis. Stable chronic appearing abnormal kidneys left more than the right. Mild retroperitoneal lymphadenopathy stable and thought to be related to kidneys. A trace of free pelvic fluid and a right ovarian cyst.  Abdominal ultrasound obtained this a.m. Shows:Gallbladder is well distended without gallbladder wall thickening or pericholecystic fluid. A few small echogenic foci are noted without significant shadowing. These may represent tiny gallstones or tumefactive sludge.  Common bile duct:  Diameter: 4 mm. lipase on admission was 19. LFTs are normal.  We are asked to see to rule out possible cholecystitis. She continues to have abdominal pain. 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  Past Medical History:  Diagnosis Date  . Cyst of ovary    PT STATES SHE IS TAKING ANTIBIOTICS FOR CYST ON OVARY  . HIV (human immunodeficiency virus infection) ([place])   . Hydronephrosis, left   . Pyelonephritis   . Recurrent boils   . Trichomonas infection   . Urinary frequency    and nocturia    Past Surgical History:  Procedure Laterality Date  . CESAREAN SECTION  2006  . CYSTOSCOPY WITH RETROGRADE PYELOGRAM, URETEROSCOPY AND STENT PLACEMENT Bilateral 10/28/2013   Procedure: CYSTOSCOPY WITH RETROGRADE PYELOGRAM, CYSTOGRAM;  Surgeon: Bernestine Amass, MD;  Location: [facility];  Service: Urology;  Laterality: Bilateral;  . LAPAROSCOPIC UNILATERAL SALPINGO OOPHERECTOMY Left 01/05/2014   Procedure: LAPAROSCOPIC LEFT  SALPINGO OOPHORECTOMY;  Surgeon: Mora Bellman, MD;  Location: [facility];  Service: Gynecology;  Laterality: Left;    Family History  Problem Relation Age of Onset  . HIV Mother     Social History:  reports that she has been smoking Cigarettes and Cigars.  She has a 1.40 pack-year smoking history. She has never used smokeless tobacco. She reports that she  uses drugs, including Marijuana, about 7 times per week. She reports that she does not drink alcohol.  Tobacco - <1PPD x 15 years ETOH:  None Drugs - Marijuana for pain  On disability - 1 son age 29; lives with her sister  Allergies:  Allergies  Allergen Reactions  . Ibuprofen Rash and Other (See Comments)    "Irritates my bladder"  . Latex Other (See Comments)    If pt wears latex gloves or any contact with latex causes itching and rash. Some tapes also cause itching, skin irritation - paper tape seems to be okay  . Sulfamethoxazole-Trimethoprim Other (See Comments)    Rash, chills and hives  . Tylenol [Acetaminophen] Other (See Comments)    Makes me pee more- irritates bladder    Medications:  Prior to Admission:  Prescriptions Prior to Admission  Medication Sig Dispense Refill Last Dose  . dapsone 100 MG tablet Take 1 tablet (100 mg total) by mouth daily. 30 tablet 11 Past Week at Unknown time  . EDURANT 25 MG TABS tablet TAKE ONE TABLET BY MOUTH EVERY MORNING WITH BREAKFAST 30 tablet 2 12/05/2015 at Unknown time  . EPIVIR 10 MG/ML solution TAKE 30 MILLILITERS (2 TABLESPOONFULS) BY MOUTH ONCE DAILY 960 mL 2 12/05/2015 at Unknown time  . ondansetron (ZOFRAN ODT) 4 MG disintegrating tablet Take 1 tablet (4 mg total) by mouth every 8 (eight) hours as needed for nausea or vomiting. 10 tablet 0 Past Week at Unknown time  . TIVICAY 50 MG tablet TAKE ONE (1) TABLET BY MOUTH EVERY DAY 30 tablet 2 12/05/2015 at Unknown time   Scheduled: . cefTRIAXone (ROCEPHIN)  IV  1 g Intravenous Q24H  . dapsone  100 mg Oral Daily  . dolutegravir  50 mg Oral Daily  . heparin  5,000 Units Subcutaneous Q8H  . lamiVUDine  300 mg Oral Daily  . rilpivirine  25 mg Oral Q breakfast   Continuous: . sodium chloride 110 mL/hr at 12/07/15 0525   YNX:GZFPOIPP (SUBLIMAZE) injection, ondansetron, traMADol Anti-infectives    Start     Dose/Rate Route Frequency Ordered Stop   12/07/15 1400  cefTRIAXone (ROCEPHIN)  1 g in dextrose 5 % 50 mL IVPB     1 g 100 mL/hr over 30 Minutes Intravenous Every 24 hours 12/06/15 1818     12/07/15 1000  dapsone tablet 100 mg     100 mg Oral Daily 12/06/15 1847     12/07/15 1000  dolutegravir (TIVICAY) tablet 50 mg     50 mg Oral Daily 12/06/15 1818     12/07/15 1000  lamiVUDine (EPIVIR) 10 MG/ML solution 300 mg     300 mg Oral Daily 12/06/15 1818     12/07/15 0800  rilpivirine (EDURANT) tablet 25 mg     25 mg Oral Daily with breakfast 12/06/15 2119     12/06/15 1430  cefTRIAXone (ROCEPHIN) 1 g in dextrose 5 % 50 mL IVPB     1 g 100 mL/hr over 30 Minutes Intravenous  Once 12/06/15 1420 12/06/15 1508      Results for orders placed or performed  during the hospital encounter of 12/06/15 (from the past 48 hour(s))  Urinalysis, Routine w reflex microscopic     Status: Abnormal   Collection Time: 12/06/15 12:55 PM  Result Value Ref Range   Color, Urine YELLOW YELLOW   APPearance CLOUDY (A) CLEAR   Specific Gravity, Urine 1.014 1.005 - 1.030   pH 6.0 5.0 - 8.0   Glucose, UA NEGATIVE NEGATIVE mg/dL   Hgb urine dipstick LARGE (A) NEGATIVE   Bilirubin Urine NEGATIVE NEGATIVE   Ketones, ur NEGATIVE NEGATIVE mg/dL   Protein, ur 30 (A) NEGATIVE mg/dL   Nitrite NEGATIVE NEGATIVE   Leukocytes, UA LARGE (A) NEGATIVE   RBC / HPF 0-5 0 - 5 RBC/hpf   WBC, UA TOO NUMEROUS TO COUNT 0 - 5 WBC/hpf   Bacteria, UA FEW (A) NONE SEEN   Squamous Epithelial / LPF 6-30 (A) NONE SEEN   Mucous PRESENT   Lipase, blood     Status: None   Collection Time: 12/06/15  1:07 PM  Result Value Ref Range   Lipase 18 11 - 51 U/L  Comprehensive metabolic panel     Status: Abnormal   Collection Time: 12/06/15  1:07 PM  Result Value Ref Range   Sodium 136 135 - 145 mmol/L   Potassium 3.4 (L) 3.5 - 5.1 mmol/L   Chloride 108 101 - 111 mmol/L   CO2 24 22 - 32 mmol/L   Glucose, Bld 98 65 - 99 mg/dL   BUN 11 6 - 20 mg/dL   Creatinine, Ser 0.63 0.44 - 1.00 mg/dL   Calcium 9.1 8.9 - 10.3 mg/dL    Total Protein 9.8 (H) 6.5 - 8.1 g/dL   Albumin 3.3 (L) 3.5 - 5.0 g/dL   AST 26 15 - 41 U/L   ALT 17 14 - 54 U/L   Alkaline Phosphatase 65 38 - 126 U/L   Total Bilirubin 0.5 0.3 - 1.2 mg/dL   GFR calc non Af Amer >60 >60 mL/min   GFR calc Af Amer >60 >60 mL/min    Comment: (NOTE) The eGFR has been calculated using the CKD EPI equation. This calculation has not been validated in all clinical situations. eGFR's persistently <60 mL/min signify possible Chronic Kidney Disease.    Anion gap 4 (L) 5 - 15  CBC     Status: Abnormal   Collection Time: 12/06/15  1:07 PM  Result Value Ref Range   WBC 3.8 (L) 4.0 - 10.5 K/uL   RBC 4.11 3.87 - 5.11 MIL/uL   Hemoglobin 12.7 12.0 - 15.0 g/dL   HCT 38.2 36.0 - 46.0 %   MCV 92.9 78.0 - 100.0 fL   MCH 30.9 26.0 - 34.0 pg   MCHC 33.2 30.0 - 36.0 g/dL   RDW 14.4 11.5 - 15.5 %   Platelets 146 (L) 150 - 400 K/uL  I-Stat beta hCG blood, ED     Status: None   Collection Time: 12/06/15  1:34 PM  Result Value Ref Range   I-stat hCG, quantitative <5.0 <5 mIU/mL   Comment 3            Comment:   GEST. AGE      CONC.  (mIU/mL)   <=1 WEEK        5 - 50     2 WEEKS       50 - 500     3 WEEKS       100 - 10,000     4 WEEKS  1,000 - 30,000        FEMALE AND NON-PREGNANT FEMALE:     LESS THAN 5 mIU/mL   I-Stat CG4 Lactic Acid, ED     Status: Abnormal   Collection Time: 12/06/15  1:36 PM  Result Value Ref Range   Lactic Acid, Venous 2.43 (HH) 0.5 - 1.9 mmol/L   Comment NOTIFIED PHYSICIAN   Urine culture     Status: None   Collection Time: 12/06/15  2:20 PM  Result Value Ref Range   Specimen Description URINE, CLEAN CATCH    Special Requests NONE    Culture NO GROWTH    Report Status 12/07/2015 FINAL   I-Stat CG4 Lactic Acid, ED     Status: None   Collection Time: 12/06/15  4:57 PM  Result Value Ref Range   Lactic Acid, Venous 0.75 0.5 - 1.9 mmol/L  Hepatic function panel     Status: Abnormal   Collection Time: 12/06/15  7:05 PM  Result Value  Ref Range   Total Protein 8.4 (H) 6.5 - 8.1 g/dL   Albumin 2.9 (L) 3.5 - 5.0 g/dL   AST 23 15 - 41 U/L   ALT 15 14 - 54 U/L   Alkaline Phosphatase 56 38 - 126 U/L   Total Bilirubin 0.4 0.3 - 1.2 mg/dL   Bilirubin, Direct <0.1 (L) 0.1 - 0.5 mg/dL   Indirect Bilirubin NOT CALCULATED 0.3 - 0.9 mg/dL  Lipase, blood     Status: None   Collection Time: 12/06/15  7:05 PM  Result Value Ref Range   Lipase 19 11 - 51 U/L  Urinalysis, Routine w reflex microscopic     Status: Abnormal   Collection Time: 12/07/15  2:24 AM  Result Value Ref Range   Color, Urine YELLOW YELLOW   APPearance CLOUDY (A) CLEAR   Specific Gravity, Urine >1.046 (H) 1.005 - 1.030   pH 7.0 5.0 - 8.0   Glucose, UA NEGATIVE NEGATIVE mg/dL   Hgb urine dipstick MODERATE (A) NEGATIVE   Bilirubin Urine NEGATIVE NEGATIVE   Ketones, ur NEGATIVE NEGATIVE mg/dL   Protein, ur 30 (A) NEGATIVE mg/dL   Nitrite NEGATIVE NEGATIVE   Leukocytes, UA LARGE (A) NEGATIVE   RBC / HPF TOO NUMEROUS TO COUNT 0 - 5 RBC/hpf   WBC, UA TOO NUMEROUS TO COUNT 0 - 5 WBC/hpf   Bacteria, UA NONE SEEN NONE SEEN   Squamous Epithelial / LPF 6-30 (A) NONE SEEN   Mucous PRESENT   Comprehensive metabolic panel     Status: Abnormal   Collection Time: 12/07/15  5:24 AM  Result Value Ref Range   Sodium 135 135 - 145 mmol/L   Potassium 3.4 (L) 3.5 - 5.1 mmol/L   Chloride 106 101 - 111 mmol/L   CO2 20 (L) 22 - 32 mmol/L   Glucose, Bld 89 65 - 99 mg/dL   BUN 7 6 - 20 mg/dL   Creatinine, Ser 0.54 0.44 - 1.00 mg/dL   Calcium 8.5 (L) 8.9 - 10.3 mg/dL   Total Protein 7.3 6.5 - 8.1 g/dL   Albumin 2.6 (L) 3.5 - 5.0 g/dL   AST 20 15 - 41 U/L   ALT 14 14 - 54 U/L   Alkaline Phosphatase 51 38 - 126 U/L   Total Bilirubin 0.4 0.3 - 1.2 mg/dL   GFR calc non Af Amer >60 >60 mL/min   GFR calc Af Amer >60 >60 mL/min    Comment: (NOTE) The eGFR has been calculated using  the CKD EPI equation. This calculation has not been validated in all clinical situations. eGFR's  persistently <60 mL/min signify possible Chronic Kidney Disease.    Anion gap 9 5 - 15    Ct Abdomen Pelvis W Contrast  Result Date: 12/07/2015 CLINICAL DATA:  Abdominal pain with positive Murphy's sign. EXAM: CT ABDOMEN AND PELVIS WITH CONTRAST TECHNIQUE: Multidetector CT imaging of the abdomen and pelvis was performed using the standard protocol following bolus administration of intravenous contrast. CONTRAST:  100 cc Isovue-300 IV COMPARISON:  Most recent CT 11/21/2015 FINDINGS: Lower chest: No acute abnormality.  No pleural fluid. Hepatobiliary: Calcified hepatic granuloma. No focal hepatic lesion. Gallbladder physiologically distended without calcified gallstone or pericholecystic inflammation. No biliary dilatation. Pancreas: Stable chronic pancreatic ductal prominence. No peripancreatic inflammation. Spleen: Normal in size without focal abnormality. Adrenals/Urinary Tract: No adrenal nodule. No hydronephrosis or perinephric edema. Cortical scarring in the left greater than right kidney with chronically dilated in left greater than right renal calices. Chronic right ureteral dilatation and enhancement, unchanged. No urolithiasis. Bladder minimally distended. Stomach/Bowel: Stomach is physiologically distended. No small or large bowel inflammation or distention. Normal appendix. Vascular/Lymphatic: Retroperitoneal lymphadenopathy is unchanged. Largest node left periaortic at the level of the kidney measures 16 mm short axis, unchanged. Mild plaque of the distal abdominal aorta again seen. Prominent bilateral external iliac nodes are unchanged. Reproductive: Uterus oriented in the right pelvis. No evidence of adnexal mass. Other: No free air or free fluid. Musculoskeletal: There are no acute or suspicious osseous abnormalities. IMPRESSION: 1. No acute abnormality or change from prior exam. 2. Normal CT appearance of the gallbladder and biliary tree. 3. Chronic findings are stable. Electronically Signed    By: Jeb Levering M.D.   On: 12/07/2015 03:18   US Abdomen Limited Ruq  Result Date: 12/07/2015 CLINICAL DATA:  Right upper quadrant pain EXAM: US ABDOMEN LIMITED - RIGHT UPPER QUADRANT COMPARISON:  CT from earlier in the same day. FINDINGS: Gallbladder: Gallbladder is well distended without gallbladder wall thickening or pericholecystic fluid. A few small echogenic foci are noted without significant shadowing. These may represent tiny gallstones or tumefactive sludge. Common bile duct: Diameter: 4 mm. Liver: Scattered calcified granulomas are noted similar to that seen on recent CT. No mass lesion is noted. IMPRESSION: Small gallstones versus tumefactive sludge. Electronically Signed   By: Inez Catalina M.D.   On: 12/07/2015 08:42    Review of Systems  Constitutional: Negative for chills, diaphoresis, fever, malaise/fatigue and weight loss.  HENT: Negative.   Eyes: Negative.   Respiratory: Positive for shortness of breath (DOE). Negative for cough, hemoptysis, sputum production and wheezing.        Her 51 y/o son has told her she holds her breath when sleeping.  Cardiovascular: Positive for orthopnea and PND. Negative for chest pain, palpitations, claudication and leg swelling.  Gastrointestinal: Positive for abdominal pain (chronic pain for 3 years, comes and goes present 3 of 4 weeks per month), constipation, heartburn, nausea and vomiting. Negative for blood in stool, diarrhea and melena.  Genitourinary: Negative for dysuria, frequency, hematuria and urgency.       Smells like old urine  Musculoskeletal: Positive for back pain (back pain, goes to thighs, and down both legs to ankles when severe.). Negative for falls, joint pain, myalgias and neck pain.  Skin: Negative.   Neurological: Negative for dizziness, tingling, tremors, sensory change, speech change, focal weakness, seizures, loss of consciousness, weakness and headaches.       Hx of  seizure's she says her last one was in 10/2015.   Endo/Heme/Allergies: Negative for environmental allergies and polydipsia. Does not bruise/bleed easily.  Psychiatric/Behavioral: Negative.    Blood pressure 104/63, pulse 76, temperature 97.9 F (36.6 C), temperature source Oral, resp. rate 20, height 5' 1" (1.549 m), weight 70.3 kg (155 lb), last menstrual period 11/01/2015, SpO2 100 %. Physical Exam  Constitutional: She is oriented to person, place, and time. She appears well-developed and well-nourished. No distress.  HENT:  Head: Normocephalic and atraumatic.  Mouth/Throat: Oropharynx is clear and moist. No oropharyngeal exudate.  Eyes: Right eye exhibits no discharge. Left eye exhibits no discharge. No scleral icterus.  Neck: Normal range of motion. Neck supple. No JVD present. No tracheal deviation present. No thyromegaly present.  Cardiovascular: Normal rate, regular rhythm, normal heart sounds and intact distal pulses.   No murmur heard. Respiratory: Effort normal and breath sounds normal. No respiratory distress. She has no wheezes. She has no rales. She exhibits no tenderness.  GI: Soft. Bowel sounds are normal. She exhibits no distension and no mass. There is tenderness (she is tender to palpation in all 4 quadrants.  soft and not distended.  ). There is no rebound and no guarding.  Musculoskeletal: She exhibits no edema or tenderness.  Lymphadenopathy:    She has no cervical adenopathy.  Neurological: She is alert and oriented to person, place, and time. No cranial nerve deficit.  Skin: Skin is warm and dry. No rash noted. She is not diaphoretic. No erythema. No pallor.  Psychiatric: She has a normal mood and affect. Her behavior is normal. Judgment and thought content normal.    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 Megan Salon ID Seizures  - last one was  10/2015 Hx of VUR/recurrent UTI's,   Plan:  She has chronic pain not really related to PO intake, normal LFT's and WBC.  Pain/tenderness all 4 quadrants, some sludge or gallstones in GB.  CT is unremarkable.  We have recommended HIDA scan and that has been ordered.  We will follow with you.  , 12/07/2015, 2:35 PM

## 2015-12-07 NOTE — Progress Notes (Signed)
Pt requested to talk to the MD, MD was paged and came to talk to the pt. Pt was nauseous, and Zofran was put in for the pt. Will continue to monitor.

## 2015-12-08 ENCOUNTER — Inpatient Hospital Stay (HOSPITAL_COMMUNITY): Payer: Medicaid Other

## 2015-12-08 LAB — CBC
HCT: 27.8 % — ABNORMAL LOW (ref 36.0–46.0)
Hemoglobin: 9.2 g/dL — ABNORMAL LOW (ref 12.0–15.0)
MCH: 30.7 pg (ref 26.0–34.0)
MCHC: 33.1 g/dL (ref 30.0–36.0)
MCV: 92.7 fL (ref 78.0–100.0)
PLATELETS: 128 10*3/uL — AB (ref 150–400)
RBC: 3 MIL/uL — AB (ref 3.87–5.11)
RDW: 13.9 % (ref 11.5–15.5)
WBC: 2.1 10*3/uL — ABNORMAL LOW (ref 4.0–10.5)

## 2015-12-08 LAB — BASIC METABOLIC PANEL
Anion gap: 5 (ref 5–15)
BUN: 5 mg/dL — ABNORMAL LOW (ref 6–20)
CALCIUM: 8.4 mg/dL — AB (ref 8.9–10.3)
CO2: 21 mmol/L — AB (ref 22–32)
CREATININE: 0.62 mg/dL (ref 0.44–1.00)
Chloride: 109 mmol/L (ref 101–111)
Glucose, Bld: 83 mg/dL (ref 65–99)
Potassium: 3.4 mmol/L — ABNORMAL LOW (ref 3.5–5.1)
Sodium: 135 mmol/L (ref 135–145)

## 2015-12-08 LAB — HIV-1 RNA QUANT-NO REFLEX-BLD
HIV 1 RNA Quant: 9830 copies/mL
LOG10 HIV-1 RNA: 3.993 {Log_copies}/mL

## 2015-12-08 MED ORDER — CEPHALEXIN 500 MG PO CAPS: 500.0000 mg | ORAL_CAPSULE | Freq: Four times a day (QID) | ORAL | 0 refills | Status: AC

## 2015-12-08 MED ORDER — DOCUSATE SODIUM 100 MG PO CAPS: 100.0000 mg | ORAL_CAPSULE | Freq: Two times a day (BID) | ORAL | 0 refills | Status: DC

## 2015-12-08 MED ORDER — TECHNETIUM TC 99M MEBROFENIN IV KIT
5.2000 | PACK | Freq: Once | INTRAVENOUS | Status: AC | PRN
  Administered 2015-12-08: 5.2 via INTRAVENOUS

## 2015-12-08 MED ORDER — POTASSIUM CHLORIDE CRYS ER 20 MEQ PO TBCR
40.0000 meq | EXTENDED_RELEASE_TABLET | Freq: Once | ORAL | Status: AC
  Administered 2015-12-08: 40 meq via ORAL
  Filled 2015-12-08: qty 2

## 2015-12-08 MED ORDER — CEPHALEXIN 500 MG PO CAPS
500.0000 mg | ORAL_CAPSULE | Freq: Two times a day (BID) | ORAL | Status: DC
  Administered 2015-12-08: 500 mg via ORAL
  Filled 2015-12-08: qty 1

## 2015-12-08 MED ORDER — DOCUSATE SODIUM 100 MG PO CAPS
100.0000 mg | ORAL_CAPSULE | Freq: Two times a day (BID) | ORAL | Status: DC
  Administered 2015-12-08: 100 mg via ORAL
  Filled 2015-12-08: qty 1

## 2015-12-08 MED ORDER — ACETAMINOPHEN 325 MG PO TABS
650.0000 mg | ORAL_TABLET | Freq: Four times a day (QID) | ORAL | Status: DC | PRN
Start: 2015-12-08 — End: 2015-12-08

## 2015-12-08 MED ORDER — TRAMADOL HCL 50 MG PO TABS: 50.0000 mg | ORAL_TABLET | Freq: Four times a day (QID) | ORAL | 0 refills | Status: AC | PRN

## 2015-12-08 NOTE — Progress Notes (Signed)
[facility] Progress Note     Subjective: Pt states her pain has improved. No nausea or vomiting. No new complaints.   Objective: Vital signs in last 24 hours: Temp:  [97.9 F (36.6 C)-98.2 F (36.8 C)] 97.9 F (36.6 C) (12/07 0700) Pulse Rate:  [68-71] 69 (12/07 0700) Resp:  [16-20] 16 (12/07 0700) BP: (99-115)/(57-77) 115/77 (12/07 0700) SpO2:  [98 %-100 %] 98 % (12/07 0700) Last BM Date: 12/06/15  Intake/Output from previous day: 12/06 0701 - 12/07 0700 In: 930 [I.V.:880; IV Piggyback:50] Out: -  Intake/Output this shift: No intake/output data recorded.  PE: Gen:  Alert, NAD, pleasant, cooperative, sitting up in bed Card:  RRR, no M/G/R heard Pulm:  CTA, effort normal Abd: Soft, ND, +BS, mild TTP to lower abdomin bilaterally Skin: no rashes noted, warm and dry  Lab Results:   Recent Labs  12/06/15 1307 12/08/15 0433  WBC 3.8* 1.9*  HGB 12.7 9.6*  HCT 38.2 28.9*  PLT 146* 136*   BMET  Recent Labs  12/07/15 0524 12/08/15 0433  NA 135 135  K 3.4* 3.4*  CL 106 109  CO2 20* 21*  GLUCOSE 89 83  BUN 7 5*  CREATININE 0.54 0.62  CALCIUM 8.5* 8.4*   PT/INR No results for input(s): LABPROT, INR in the last 72 hours. CMP     Component Value Date/Time   NA 135 12/08/2015 0433   K 3.4 (L) 12/08/2015 0433   CL 109 12/08/2015 0433   CO2 21 (L) 12/08/2015 0433   GLUCOSE 83 12/08/2015 0433   BUN 5 (L) 12/08/2015 0433   CREATININE 0.62 12/08/2015 0433   CREATININE 0.59 11/04/2014 1504   CALCIUM 8.4 (L) 12/08/2015 0433   PROT 7.3 12/07/2015 0524   ALBUMIN 2.6 (L) 12/07/2015 0524   AST 20 12/07/2015 0524   ALT 14 12/07/2015 0524   ALKPHOS 51 12/07/2015 0524   BILITOT 0.4 12/07/2015 0524   GFRNONAA >60 12/08/2015 0433   GFRNONAA >89 12/17/2013 1021   GFRAA >60 12/08/2015 0433   GFRAA >89 12/17/2013 1021   Lipase     Component Value Date/Time   LIPASE 19 12/06/2015 1905       Studies/Results: Ct Abdomen Pelvis W Contrast  Result  Date: 12/07/2015 CLINICAL DATA:  Abdominal pain with positive Murphy's sign. EXAM: CT ABDOMEN AND PELVIS WITH CONTRAST TECHNIQUE: Multidetector CT imaging of the abdomen and pelvis was performed using the standard protocol following bolus administration of intravenous contrast. CONTRAST:  100 cc Isovue-300 IV COMPARISON:  Most recent CT 11/21/2015 FINDINGS: Lower chest: No acute abnormality.  No pleural fluid. Hepatobiliary: Calcified hepatic granuloma. No focal hepatic lesion. Gallbladder physiologically distended without calcified gallstone or pericholecystic inflammation. No biliary dilatation. Pancreas: Stable chronic pancreatic ductal prominence. No peripancreatic inflammation. Spleen: Normal in size without focal abnormality. Adrenals/Urinary Tract: No adrenal nodule. No hydronephrosis or perinephric edema. Cortical scarring in the left greater than right kidney with chronically dilated in left greater than right renal calices. Chronic right ureteral dilatation and enhancement, unchanged. No urolithiasis. Bladder minimally distended. Stomach/Bowel: Stomach is physiologically distended. No small or large bowel inflammation or distention. Normal appendix. Vascular/Lymphatic: Retroperitoneal lymphadenopathy is unchanged. Largest node left periaortic at the level of the kidney measures 16 mm short axis, unchanged. Mild plaque of the distal abdominal aorta again seen. Prominent bilateral external iliac nodes are unchanged. Reproductive: Uterus oriented in the right pelvis. No evidence of adnexal mass. Other: No free air or free fluid. Musculoskeletal: There are no acute  or suspicious osseous abnormalities. IMPRESSION: 1. No acute abnormality or change from prior exam. 2. Normal CT appearance of the gallbladder and biliary tree. 3. Chronic findings are stable. Electronically Signed   By: Rubye OaksMelanie  Ehinger M.D.   On: 12/07/2015 03:18   [place] Abdomen Limited Ruq  Result Date: 12/07/2015 CLINICAL DATA:  Right upper  quadrant pain EXAM: US ABDOMEN LIMITED - RIGHT UPPER QUADRANT COMPARISON:  CT from earlier in the same day. FINDINGS: Gallbladder: Gallbladder is well distended without gallbladder wall thickening or pericholecystic fluid. A few small echogenic foci are noted without significant shadowing. These may represent tiny gallstones or tumefactive sludge. Common bile duct: Diameter: 4 mm. Liver: Scattered calcified granulomas are noted similar to that seen on recent CT. No mass lesion is noted. IMPRESSION: Small gallstones versus tumefactive sludge. Electronically Signed   By: Alcide CleverMark  Lukens M.D.   On: 12/07/2015 08:42    Anti-infectives: Anti-infectives    Start     Dose/Rate Route Frequency Ordered Stop   12/07/15 1400  cefTRIAXone (ROCEPHIN) 1 g in dextrose 5 % 50 mL IVPB     1 g 100 mL/hr over 30 Minutes Intravenous Every 24 hours 12/06/15 1818     12/07/15 1000  dapsone tablet 100 mg     100 mg Oral Daily 12/06/15 1847     12/07/15 1000  dolutegravir (TIVICAY) tablet 50 mg     50 mg Oral Daily 12/06/15 1818     12/07/15 1000  lamiVUDine (EPIVIR) 10 MG/ML solution 300 mg     300 mg Oral Daily 12/06/15 1818     12/07/15 0800  rilpivirine (EDURANT) tablet 25 mg     25 mg Oral Daily with breakfast 12/06/15 2119     12/06/15 1430  cefTRIAXone (ROCEPHIN) 1 g in dextrose 5 % 50 mL IVPB     1 g 100 mL/hr over 30 Minutes Intravenous  Once 12/06/15 1420 12/06/15 1508       Assessment/Plan  Chronic and acute abdominal pain Hx of vesicoureteral reflux with recurrent UTI Hx of seizure last one 10/2015 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 Orvan Falconerampbell ID  VTE: heparin FEN: regular diet  Plan: Normal HIDA scan. Do not feel her abdominal pain is related to her gallbladder. We will sign off. Thank you for the consult.    LOS: 1 day    Jerre SimonJessica L Torrian Canion , [facility] 12/08/2015, 8:23 AM Pager: [phone] Consults: [id number] Mon-Fri 7:00 am-4:30 pm Sat-Sun 7:00  am-11:30 am

## 2015-12-08 NOTE — Progress Notes (Signed)
Family Medicine Teaching Service Daily Progress Note Intern Pager: [phone]  Patient name: Kara Allen Medical record number: [id number] Date of birth: 06/18/1980 Age: 35 y.o. Gender: female  Primary Care Provider: No PCP Per Patient Consultants: None Code Status: Full  Pt Overview and Major Events to Date:  1. Admit to FMTS  Assessment and Plan: Kara PresserSharon D Allen is a 35 y.o. female presenting with flank pain and RUQ pain. PMH is significant for HIV and recurrent UTIs with VUR, anxiety/depression, mild cognitive impairment.   Flank/abdominal pain: Pain improving with ceftriaxone and tramadol. HIDA scan normal and surgery signed off.  BCX: NGTD.  UCX: Negative.  Tolerating regular diet.  CBC showed drop in all cell lines this morning.  Likely to be dilutional.  Will repeat CBC this afternoon.  If this is normal and patient continues to improve we will DC with keflex 500mg  BID for total 14 day course and I will give a short course of tramadol for pain control and zofran for nausea.  - Follow-up blood cultures- NGTD - surgery consulted; signed off - regular diet - CTX (12/5>>) day 3 - cont with tramadol  HIV positive: Spoke with her infectious diseases, Dr. Orvan Falconerampbell who follows her for her HIV and he recommended getting her follow up with their clinic upon discharge. CD4 count 140. HIV RNA pending.   - Continue tivicay 50mg  daily, epivir 300mg  daily and edurant 25mg  daily - Dapsone 100mg  daily for Pneumocystis prophylaxis. - f/u RNA load  Hypokalemia: Potassium 3.4 today.  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 Isabel Allen.  CT abd on 11/21/15 showed stable CT appearance of chronically abnormal kidneys including left greater than right renal cortical atrophy and chronic urothelial inflammation.  - Will need outpatient follow up with  Urology  Anxiety/depression: Stable - Consider GAD7 and PHQ2 in outpatient  FEN/GI: regular diet Prophylaxis: subQ heparin  Disposition: discharge w/ abx once stable  Subjective:  Feels much better this morning.  Denies NVD or SOB.   Objective: Temp:  [97.9 F (36.6 C)-98.2 F (36.8 C)] 97.9 F (36.6 C) (12/07 0700) Pulse Rate:  [68-71] 69 (12/07 0700) Resp:  [16-20] 16 (12/07 0700) BP: (99-115)/(57-77) 115/77 (12/07 0700) SpO2:  [98 %-100 %] 98 % (12/07 0700) Physical Exam: General: 35yo F sitting up in bed appearing comfortable and in NAD ENTM: MMM Cardiovascular: RRR, no murmurs, palpable pulses Respiratory: NWOB, CTABL, no wheezing or rhonci Gastrointestinal: soft, mildly tender RUQ and improved CVA tenderness, no peritoneal signs or guarding MSK: no deformities, moves all extremities Derm: warm and dry Neuro: no gross focal neurological deficits Psych: Normal mood and affect  Laboratory:  Recent Labs Lab 12/06/15 1307 12/08/15 0433  WBC 3.8* 1.9*  HGB 12.7 9.6*  HCT 38.2 28.9*  PLT 146* 136*    Recent Labs Lab 12/06/15 1307 12/06/15 1905 12/07/15 0524 12/08/15 0433  NA 136  --  135 135  K 3.4*  --  3.4* 3.4*  CL 108  --  106 109  CO2 24  --  20* 21*  BUN 11  --  7 5*  CREATININE 0.63  --  0.54 0.62  CALCIUM 9.1  --  8.5* 8.4*  PROT 9.8* 8.4* 7.3  --   BILITOT 0.5 0.4 0.4  --   ALKPHOS 65 56 51  --   ALT 17 15  14  --   AST 26 23 20   --   GLUCOSE 98  --  89 83    Imaging/Diagnostic Tests: Ct Abdomen Pelvis W Contrast  Result Date: 12/07/2015 CLINICAL DATA:  Abdominal pain with positive Murphy's sign. EXAM: CT ABDOMEN AND PELVIS WITH CONTRAST TECHNIQUE: Multidetector CT imaging of the abdomen and pelvis was performed using the standard protocol following bolus administration of intravenous contrast. CONTRAST:  100 cc Isovue-300 IV COMPARISON:  Most recent CT 11/21/2015 FINDINGS: Lower chest: No acute abnormality.  No pleural fluid.  Hepatobiliary: Calcified hepatic granuloma. No focal hepatic lesion. Gallbladder physiologically distended without calcified gallstone or pericholecystic inflammation. No biliary dilatation. Pancreas: Stable chronic pancreatic ductal prominence. No peripancreatic inflammation. Spleen: Normal in size without focal abnormality. Adrenals/Urinary Tract: No adrenal nodule. No hydronephrosis or perinephric edema. Cortical scarring in the left greater than right kidney with chronically dilated in left greater than right renal calices. Chronic right ureteral dilatation and enhancement, unchanged. No urolithiasis. Bladder minimally distended. Stomach/Bowel: Stomach is physiologically distended. No small or large bowel inflammation or distention. Normal appendix. Vascular/Lymphatic: Retroperitoneal lymphadenopathy is unchanged. Largest node left periaortic at the level of the kidney measures 16 mm short axis, unchanged. Mild plaque of the distal abdominal aorta again seen. Prominent bilateral external iliac nodes are unchanged. Reproductive: Uterus oriented in the right pelvis. No evidence of adnexal mass. Other: No free air or free fluid. Musculoskeletal: There are no acute or suspicious osseous abnormalities. IMPRESSION: 1. No acute abnormality or change from prior exam. 2. Normal CT appearance of the gallbladder and biliary tree. 3. Chronic findings are stable. Electronically Signed   By: Rubye OaksMelanie  Ehinger M.D.   On: 12/07/2015 03:18   [place] Abdomen Limited Ruq  Result Date: 12/07/2015 CLINICAL DATA:  Right upper quadrant pain EXAM: US ABDOMEN LIMITED - RIGHT UPPER QUADRANT COMPARISON:  CT from earlier in the same day. FINDINGS: Gallbladder: Gallbladder is well distended without gallbladder wall thickening or pericholecystic fluid. A few small echogenic foci are noted without significant shadowing. These may represent tiny gallstones or tumefactive sludge. Common bile duct: Diameter: 4 mm. Liver: Scattered calcified  granulomas are noted similar to that seen on recent CT. No mass lesion is noted. IMPRESSION: Small gallstones versus tumefactive sludge. Electronically Signed   By: Alcide CleverMark  Lukens M.D.   On: 12/07/2015 08:42    Renne Muscaaniel L Yulisa Chirico, MD 12/08/2015, 8:28 AM PGY-1, [facility] FPTS Intern pager: [phone], text pages welcome

## 2015-12-08 NOTE — Progress Notes (Signed)
Awilda MetroSharon D Nusser to be D/C'd home per MD order.  Discussed with the patient and all questions fully answered.  VSS, Skin clean, dry and intact without evidence of skin break down, no evidence of skin tears noted. IV catheter discontinued intact. Site without signs and symptoms of complications. Dressing and pressure applied.  An After Visit Summary was printed and given to the patient. Patient received prescriptions.  D/c education completed with patient/family including follow up instructions, medication list, d/c activities limitations if indicated, with other d/c instructions as indicated by MD - patient able to verbalize understanding, all questions fully answered.   Patient instructed to return to ED, call 911, or call MD for any changes in condition.   Patient escorted via WC, and D/C home via private auto.  Joellyn HaffKayla L Price 12/08/2015 3:56 PM

## 2015-12-08 NOTE — Discharge Instructions (Signed)
You were admitted to the hospital for abdominal pain and we are currently treating you for a kidney infection.  You have improved with IV antibiotics and we will be discharging you with an antibiotic called keflex.  You will take this medicine 4 times daily for the next 11 days.  I will also send you home with some pain medication for the next few days and some medication for nausea.   You need to schedule follow up appointments with Dr. Orvan Falconerampbell at [phone] and also your Urologist Dr. Isabel CapriceGrapey @ [phone].  We will also be happy to see you at our primary [facility].  Please call [phone] to set up an appointment as a new patient.    If you continue to develop worsening pain, vomiting, fever and chills I would be seen urgently.

## 2015-12-09 ENCOUNTER — Telehealth: Payer: Self-pay | Admitting: *Deleted

## 2015-12-09 LAB — CBC
HEMATOCRIT: 28.9 % — AB (ref 36.0–46.0)
Hemoglobin: 9.6 g/dL — ABNORMAL LOW (ref 12.0–15.0)
MCH: 30.9 pg (ref 26.0–34.0)
MCHC: 33.2 g/dL (ref 30.0–36.0)
MCV: 92.9 fL (ref 78.0–100.0)
PLATELETS: 136 10*3/uL — AB (ref 150–400)
RBC: 3.11 MIL/uL — ABNORMAL LOW (ref 3.87–5.11)
RDW: 14.1 % (ref 11.5–15.5)
WBC: 1.9 10*3/uL — ABNORMAL LOW (ref 4.0–10.5)

## 2015-12-09 NOTE — Telephone Encounter (Signed)
Attempted to reach patient to learn how she is feeling and to see if she is [facility] as she has no PCP. There was no answer and no VM set up. Will make second attempt later.  Kinnie FeilL. Marlene Pfluger, RN, BSN

## 2015-12-11 LAB — CULTURE, BLOOD (ROUTINE X 2)
Culture: NO GROWTH
Culture: NO GROWTH

## 2015-12-12 ENCOUNTER — Other Ambulatory Visit: Payer: Self-pay | Admitting: Internal Medicine

## 2015-12-12 NOTE — Telephone Encounter (Signed)
Second attempt to reach patient following hospital discharge on 12/08/2015 to learn how she is feeling and to see if she is [facility] as she has no PCP. There was no answer and no VM set up. Kinnie FeilL. Sinead Hockman, RN, BSN

## 2015-12-24 ENCOUNTER — Observation Stay (HOSPITAL_COMMUNITY)
Admission: EM | Admit: 2015-12-24 | Discharge: 2015-12-25 | Disposition: A | Discharge status: Home / Self Care | Payer: Medicaid Other | Attending: Family Medicine | Admitting: Family Medicine

## 2015-12-24 ENCOUNTER — Encounter (HOSPITAL_COMMUNITY): Payer: Self-pay | Admitting: Emergency Medicine

## 2015-12-24 DIAGNOSIS — Z882 Allergy status to sulfonamides status: Secondary | ICD-10-CM | POA: Diagnosis not present

## 2015-12-24 DIAGNOSIS — D72819 Decreased white blood cell count, unspecified: Secondary | ICD-10-CM

## 2015-12-24 DIAGNOSIS — F17210 Nicotine dependence, cigarettes, uncomplicated: Secondary | ICD-10-CM | POA: Diagnosis not present

## 2015-12-24 DIAGNOSIS — Z21 Asymptomatic human immunodeficiency virus [HIV] infection status: Secondary | ICD-10-CM | POA: Diagnosis not present

## 2015-12-24 DIAGNOSIS — Z888 Allergy status to other drugs, medicaments and biological substances status: Secondary | ICD-10-CM | POA: Insufficient documentation

## 2015-12-24 DIAGNOSIS — K5900 Constipation, unspecified: Secondary | ICD-10-CM | POA: Diagnosis not present

## 2015-12-24 DIAGNOSIS — Z87440 Personal history of urinary (tract) infections: Secondary | ICD-10-CM | POA: Insufficient documentation

## 2015-12-24 DIAGNOSIS — G3184 Mild cognitive impairment, so stated: Secondary | ICD-10-CM | POA: Insufficient documentation

## 2015-12-24 DIAGNOSIS — E871 Hypo-osmolality and hyponatremia: Secondary | ICD-10-CM | POA: Diagnosis not present

## 2015-12-24 DIAGNOSIS — F329 Major depressive disorder, single episode, unspecified: Secondary | ICD-10-CM | POA: Diagnosis not present

## 2015-12-24 DIAGNOSIS — F419 Anxiety disorder, unspecified: Secondary | ICD-10-CM | POA: Diagnosis not present

## 2015-12-24 DIAGNOSIS — R112 Nausea with vomiting, unspecified: Secondary | ICD-10-CM | POA: Diagnosis not present

## 2015-12-24 DIAGNOSIS — Z91040 Latex allergy status: Secondary | ICD-10-CM | POA: Diagnosis not present

## 2015-12-24 DIAGNOSIS — N12 Tubulo-interstitial nephritis, not specified as acute or chronic: Secondary | ICD-10-CM

## 2015-12-24 DIAGNOSIS — N110 Nonobstructive reflux-associated chronic pyelonephritis: Principal | ICD-10-CM | POA: Insufficient documentation

## 2015-12-24 DIAGNOSIS — Z886 Allergy status to analgesic agent status: Secondary | ICD-10-CM | POA: Diagnosis not present

## 2015-12-24 DIAGNOSIS — R1084 Generalized abdominal pain: Secondary | ICD-10-CM

## 2015-12-24 LAB — I-STAT BETA HCG BLOOD, ED (MC, WL, AP ONLY): I-stat hCG, quantitative: <5 m[IU]/mL (ref <5)

## 2015-12-24 LAB — URINALYSIS, ROUTINE W REFLEX MICROSCOPIC
Bilirubin Urine: NEGATIVE
GLUCOSE, UA: NEGATIVE mg/dL
Ketones, ur: NEGATIVE mg/dL
NITRITE: NEGATIVE
PH: 7 (ref 5.0–8.0)
PROTEIN: 100 mg/dL — AB
SPECIFIC GRAVITY, URINE: 1.016 (ref 1.005–1.030)

## 2015-12-24 LAB — COMPREHENSIVE METABOLIC PANEL
ALBUMIN: 3.6 g/dL (ref 3.5–5.0)
ALK PHOS: 64 U/L (ref 38–126)
ALT: 16 U/L (ref 14–54)
AST: 26 U/L (ref 15–41)
Anion gap: 9 (ref 5–15)
BILIRUBIN TOTAL: 0.4 mg/dL (ref 0.3–1.2)
BUN: 10 mg/dL (ref 6–20)
CALCIUM: 9.2 mg/dL (ref 8.9–10.3)
CO2: 20 mmol/L — AB (ref 22–32)
CREATININE: 0.69 mg/dL (ref 0.44–1.00)
Chloride: 98 mmol/L — ABNORMAL LOW (ref 101–111)
GFR calc non Af Amer: >60 mL/min (ref >60)
GLUCOSE: 114 mg/dL — AB (ref 65–99)
Potassium: 3.8 mmol/L (ref 3.5–5.1)
SODIUM: 127 mmol/L — AB (ref 135–145)
TOTAL PROTEIN: 9.6 g/dL — AB (ref 6.5–8.1)

## 2015-12-24 LAB — LIPASE, BLOOD: Lipase: 18 U/L (ref 11–51)

## 2015-12-24 LAB — CBC
HCT: 37.3 % (ref 36.0–46.0)
Hemoglobin: 12.6 g/dL (ref 12.0–15.0)
MCH: 30.6 pg (ref 26.0–34.0)
MCHC: 33.8 g/dL (ref 30.0–36.0)
MCV: 90.5 fL (ref 78.0–100.0)
PLATELETS: 197 10*3/uL (ref 150–400)
RBC: 4.12 MIL/uL (ref 3.87–5.11)
RDW: 13.5 % (ref 11.5–15.5)
WBC: 3.6 10*3/uL — ABNORMAL LOW (ref 4.0–10.5)

## 2015-12-24 LAB — I-STAT CG4 LACTIC ACID, ED: LACTIC ACID, VENOUS: 1.07 mmol/L (ref 0.5–1.9)

## 2015-12-24 MED ORDER — ONDANSETRON 4 MG PO TBDP
4.0000 mg | ORAL_TABLET | Freq: Once | ORAL | Status: AC | PRN
  Administered 2015-12-24: 4 mg via ORAL

## 2015-12-24 MED ORDER — ONDANSETRON 4 MG PO TBDP
ORAL_TABLET | ORAL | Status: AC
Start: 2015-12-24 — End: 2015-12-24
  Administered 2015-12-24: 4 mg via ORAL
  Filled 2015-12-24: qty 1

## 2015-12-24 MED ORDER — SODIUM CHLORIDE 0.9 % IV BOLUS (SEPSIS)
1000.0000 mL | Freq: Once | INTRAVENOUS | Status: AC
  Administered 2015-12-25: 1000 mL via INTRAVENOUS

## 2015-12-24 MED ORDER — MORPHINE SULFATE (PF) 4 MG/ML IV SOLN
4.0000 mg | Freq: Once | INTRAVENOUS | Status: AC
  Administered 2015-12-25: 4 mg via INTRAVENOUS
  Filled 2015-12-24: qty 1

## 2015-12-24 MED ORDER — ONDANSETRON HCL 4 MG/2ML IJ SOLN
4.0000 mg | Freq: Once | INTRAMUSCULAR | Status: AC
  Administered 2015-12-25: 4 mg via INTRAVENOUS
  Filled 2015-12-24: qty 2

## 2015-12-24 NOTE — ED Provider Notes (Signed)
[facility]-EMERGENCY DEPT Provider Note   CSN: [id number] Arrival date & time: 12/24/15  2132   By signing my name below, I, Octavia Heir, attest that this documentation has been prepared under the direction and in the presence of Dione Booze, MD.  Electronically Signed: Octavia Heir, ED Scribe. 12/24/15. 11:42 PM.   History   Chief Complaint Chief Complaint  Patient presents with  . Abdominal Pain   The history is provided by the patient. No language interpreter was used.   HPI Comments: Kara Allen is a 35 y.o. female who has a Pmhx of HIV+, ovarian cysts, left hydronephrosis, recurrent UTI's, trichomonas, pyelonephritis  presents to the Emergency Department complaining of acute, on-chronic, 10/10, diffuse lower abdominal pain x 1 week. She states her pain became progressively worse yesterday. Pt expresses bilateral leg weakness, chills, nausea, vomiting, and constipation. Pt says her last normal bowel movement was 2 days ago and she is able to pass flatus minimally. She notes laying down, taking a deep breath, and certain movements increases her pain significantly. She has been evaluated multiple times for similar abdominal pain. Pt was last seen on 12/05 for a similar presentation of her abdominal pain which led to her admission for IV antibiotic management for her diagnosis of pyelonephritis. Pt received keflex and IV fluids for her diagnoses while admitted. Pt was discharged on 12/07 and reports that she felt better for about one week before the pain started to progress. She is not currently on any antibiotics. Pt has not taken any medication to relieve her pain due to being allergic to tylenol and ibuprofen. Pt has a follow up appointment with a nephrologist on January 3rd. She denies fever.  Past Medical History:  Diagnosis Date  . Cyst of ovary    PT STATES SHE IS TAKING ANTIBIOTICS FOR CYST ON OVARY  . HIV (human immunodeficiency virus infection) (HCC)   . Hydronephrosis,  left   . Pyelonephritis   . Recurrent boils   . Trichomonas infection   . Urinary frequency    and nocturia    Patient Active Problem List   Diagnosis Date Noted  . Positive Murphy's Sign   . Right upper quadrant abdominal pain   . Pyelonephritis 12/06/2015  . Elevated lactic acid level   . Flank pain   . Lower abdominal pain   . Gum inflammation 06/02/2014  . Pelvic pain in female   . Left ovarian cyst   . Back pain   . 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 HIV (human immunodeficiency virus infection) (HCC) 10/01/2013  . Hydronephrosis, left 10/01/2013  . BOILS, RECURRENT 02/12/2008  . ANXIETY 01/30/2006  . CIGARETTE SMOKER 01/30/2006  . Depression 01/30/2006  . Mild cognitive impairment 01/30/2006  . ALLERGIC RHINITIS 01/30/2006  . SYMPTOM, ENURESIS, NOCTURNAL 01/30/2006    Past Surgical History:  Procedure Laterality Date  . CESAREAN SECTION  2006  . CYSTOSCOPY WITH RETROGRADE PYELOGRAM, URETEROSCOPY AND STENT PLACEMENT Bilateral 10/28/2013   Procedure: CYSTOSCOPY WITH RETROGRADE PYELOGRAM, CYSTOGRAM;  Surgeon: Valetta Fuller, MD;  Location: [facility];  Service: Urology;  Laterality: Bilateral;  . LAPAROSCOPIC UNILATERAL SALPINGO OOPHERECTOMY Left 01/05/2014   Procedure: LAPAROSCOPIC LEFT SALPINGO OOPHORECTOMY;  Surgeon: Catalina Antigua, MD;  Location: [facility];  Service: Gynecology;  Laterality: Left;    OB History    Gravida Para Term Preterm AB Living   2 1 1   1 1    SAB TAB Ectopic Multiple Live Births   1  Home Medications    Prior to Admission medications   Medication Sig Start Date End Date Taking? Authorizing Provider  dapsone 100 MG tablet Take 1 tablet (100 mg total) by mouth daily. 08/03/15   Cliffton AstersJohn Campbell, MD  docusate sodium (COLACE) 100 MG capsule Take 1 capsule (100 mg total) by mouth 2 (two) times daily. 12/08/15   Renne Muscaaniel L Warden, MD  EDURANT 25 MG TABS tablet TAKE ONE TABLET BY MOUTH EVERY MORNING WITH BREAKFAST 12/13/15   Cliffton AstersJohn Campbell,  MD  EPIVIR 10 MG/ML solution TAKE 30 MILLILITERS (2 TABLESPOONFULS) BY MOUTH ONCE DAILY 12/13/15   Cliffton AstersJohn Campbell, MD  ondansetron (ZOFRAN ODT) 4 MG disintegrating tablet Take 1 tablet (4 mg total) by mouth every 8 (eight) hours as needed for nausea or vomiting. 11/21/15   Pricilla LovelessScott Goldston, MD  TIVICAY 50 MG tablet TAKE ONE (1) TABLET BY MOUTH EVERY DAY 12/13/15   Cliffton AstersJohn Campbell, MD    Family History Family History  Problem Relation Age of Onset  . HIV Mother     Social History Social History  Substance Use Topics  . Smoking status: Current Every Day Smoker    Packs/day: 0.10    Years: 14.00    Types: Cigarettes, Cigars  . Smokeless tobacco: Never Used     Comment: smoking 1-2 black and milds per day  . Alcohol use No     Comment:  - does smoke tobacco product called blackmilds     Allergies   Ibuprofen; Latex; Sulfamethoxazole-trimethoprim; and Tylenol [acetaminophen]   Review of Systems Review of Systems  Constitutional: Positive for chills. Negative for fever.  Gastrointestinal: Positive for abdominal pain, constipation, nausea and vomiting. Negative for diarrhea.  Genitourinary: Negative for decreased urine volume and difficulty urinating.     Physical Exam Updated Vital Signs BP 110/85 (BP Location: Left Arm)   Pulse 105   Temp 98.3 F (36.8 C) (Oral)   Resp 20   LMP 11/27/2015   SpO2 100%   Physical Exam  Constitutional: She is oriented to person, place, and time. She appears well-developed and well-nourished. No distress.  Appears uncomfortable  HENT:  Head: Normocephalic and atraumatic.  Eyes: EOM are normal. Pupils are equal, round, and reactive to light.  Neck: Normal range of motion. Neck supple. No JVD present.  Cardiovascular: Normal rate, regular rhythm and normal heart sounds.   No murmur heard. Pulmonary/Chest: Effort normal and breath sounds normal. She has no wheezes. She has no rales. She exhibits no tenderness.  Abdominal: Soft. She exhibits  no distension. There is tenderness. There is no rebound and no guarding.  Tender diffusely with maximum tenderness across the lower abdomen. Bowel sounds decreased.   Musculoskeletal: Normal range of motion. She exhibits no edema.  Lymphadenopathy:    She has no cervical adenopathy.  Neurological: She is alert and oriented to person, place, and time. No cranial nerve deficit or sensory deficit. She exhibits normal muscle tone. Coordination normal.  Skin: Skin is warm and dry. No rash noted.  Psychiatric: She has a normal mood and affect. Her behavior is normal. Judgment and thought content normal.  Nursing note and vitals reviewed.    ED Treatments / Results  DIAGNOSTIC STUDIES: Oxygen Saturation is 100% on RA, normal by my interpretation.  COORDINATION OF CARE:  11:35 PM Discussed treatment plan with pt at bedside and pt agreed to plan.  Labs (all labs ordered are listed, but only abnormal results are displayed) Labs Reviewed  COMPREHENSIVE METABOLIC PANEL - Abnormal;  Notable for the following:       Result Value   Sodium 127 (*)    Chloride 98 (*)    CO2 20 (*)    Glucose, Bld 114 (*)    Total Protein 9.6 (*)    All other components within normal limits  CBC - Abnormal; Notable for the following:    WBC 3.6 (*)    All other components within normal limits  URINALYSIS, ROUTINE W REFLEX MICROSCOPIC - Abnormal; Notable for the following:    APPearance CLOUDY (*)    Hgb urine dipstick MODERATE (*)    Protein, ur 100 (*)    Leukocytes, UA LARGE (*)    Bacteria, UA RARE (*)    Squamous Epithelial / LPF 6-30 (*)    All other components within normal limits  URINALYSIS, ROUTINE W REFLEX MICROSCOPIC - Abnormal; Notable for the following:    APPearance CLOUDY (*)    Hgb urine dipstick MODERATE (*)    Protein, ur 100 (*)    Leukocytes, UA LARGE (*)    Squamous Epithelial / LPF 6-30 (*)    All other components within normal limits  URINE CULTURE  LIPASE, BLOOD  I-STAT BETA HCG  BLOOD, ED ([facility], [facility], [facility] ONLY)  I-STAT CG4 LACTIC ACID, ED  I-STAT CG4 LACTIC ACID, ED     Procedures Procedures (including critical care time)  Medications Ordered in ED Medications  ondansetron (ZOFRAN-ODT) disintegrating tablet 4 mg (4 mg Oral Given 12/24/15 2158)     Initial Impression / Assessment and Plan / ED Course  I have reviewed the triage vital signs and the nursing notes.  Pertinent lab results that were available during my care of the patient were reviewed by me and considered in my medical decision making (see chart for details).  Clinical Course    Abdominal pain of undetermined cause. Old records are reviewed and she has recent hospitalization for pyelonephritis although urine culture came back with no growth. She actually had several other ED visits prior to the admission with the diagnosis of urinary tract infection. She has had multiple CT scans of abdomen and pelvis including during her recent hospitalization. She also had abdominal ultrasound which showed possible small gallstones, and she had a normal HIDA scan. Incidentally, she has had 5 CT scans of her abdomen and pelvis including 2 in the last 2 months. Urine today continues to show too numerous to count WBCs but is a contaminated specimen. Given her failure to respond to prior courses of IV antibiotics, will get a catheterized urine sample for culture. She may need to be on an extended course of antibiotics.  Final Clinical Impressions(s) / ED Diagnoses   Final diagnoses:  Pyelonephritis  Generalized abdominal pain  Hyponatremia  Leukopenia, unspecified type   Generalized abdominal pain which appears to be recurrence of a condition that led to her hospitalization. Review of old records shows she was hospitalized December 5 to December 7 with pyelonephritis. Culture was sterile, but she did seem to improve while she was taking her antibiotic. She seemed to get worse when antibiotic was discontinued. She had  also had 2 ED visits for urinary infections in the month preceding her admission. I suspect that she has a nidus of infection which will require a prolonged course of antibiotics. Urine sample is contaminated with 6-30 epithelial cells. Catheterized urine specimen still shows similar numbers of epithelial cells. She's given a dose of ceftriaxone in the ED. Case is discussed with Dr. Nancy Marus  of family practice service who agrees to admit her under observation status. Of note, she does have scheduled follow-up with urology on January 3.    New Prescriptions New Prescriptions   No medications on file   I personally performed the services described in this documentation, which was scribed in my presence. The recorded information has been reviewed and is accurate.     Dione Boozeavid Brockton Mckesson, MD 12/25/15 0230

## 2015-12-24 NOTE — ED Triage Notes (Signed)
Pt c/o lower abdominal pain x2 days tender to palpation. Pt also c/o feeling "hot". C/o nausea, vomited x3. Pt staates shes constipated. Last BM was two days ago. Denies problems urinating. Pt states her period was last month.

## 2015-12-25 ENCOUNTER — Encounter (HOSPITAL_COMMUNITY): Payer: Self-pay | Admitting: *Deleted

## 2015-12-25 DIAGNOSIS — E871 Hypo-osmolality and hyponatremia: Secondary | ICD-10-CM | POA: Diagnosis not present

## 2015-12-25 DIAGNOSIS — R1084 Generalized abdominal pain: Secondary | ICD-10-CM | POA: Diagnosis not present

## 2015-12-25 DIAGNOSIS — D72819 Decreased white blood cell count, unspecified: Secondary | ICD-10-CM | POA: Diagnosis not present

## 2015-12-25 DIAGNOSIS — N12 Tubulo-interstitial nephritis, not specified as acute or chronic: Secondary | ICD-10-CM | POA: Diagnosis not present

## 2015-12-25 LAB — BASIC METABOLIC PANEL
ANION GAP: 6 (ref 5–15)
BUN: 8 mg/dL (ref 6–20)
CALCIUM: 9.3 mg/dL (ref 8.9–10.3)
CHLORIDE: 105 mmol/L (ref 101–111)
CO2: 22 mmol/L (ref 22–32)
Creatinine, Ser: 0.61 mg/dL (ref 0.44–1.00)
GFR calc non Af Amer: >60 mL/min (ref >60)
Glucose, Bld: 97 mg/dL (ref 65–99)
Potassium: 3.8 mmol/L (ref 3.5–5.1)
Sodium: 133 mmol/L — ABNORMAL LOW (ref 135–145)

## 2015-12-25 LAB — URINALYSIS, ROUTINE W REFLEX MICROSCOPIC
Bacteria, UA: NONE SEEN
Bilirubin Urine: NEGATIVE
Glucose, UA: NEGATIVE mg/dL
Ketones, ur: NEGATIVE mg/dL
Nitrite: NEGATIVE
Protein, ur: 100 mg/dL — AB
SPECIFIC GRAVITY, URINE: 1.017 (ref 1.005–1.030)
pH: 7 (ref 5.0–8.0)

## 2015-12-25 LAB — I-STAT CG4 LACTIC ACID, ED: Lactic Acid, Venous: 1.43 mmol/L (ref 0.5–1.9)

## 2015-12-25 LAB — TSH: TSH: 2.61 u[IU]/mL (ref 0.350–4.500)

## 2015-12-25 MED ORDER — CEPHALEXIN 500 MG PO CAPS
500.0000 mg | ORAL_CAPSULE | Freq: Three times a day (TID) | ORAL | Status: DC
  Administered 2015-12-25 (×2): 500 mg via ORAL
  Filled 2015-12-25 (×2): qty 1

## 2015-12-25 MED ORDER — AMITRIPTYLINE HCL 25 MG PO TABS: ORAL_TABLET | ORAL | 1 refills | Status: DC

## 2015-12-25 MED ORDER — ONDANSETRON HCL 4 MG PO TABS: 4.0000 mg | ORAL_TABLET | Freq: Four times a day (QID) | ORAL | Status: DC | PRN

## 2015-12-25 MED ORDER — RILPIVIRINE HCL 25 MG PO TABS
25.0000 mg | ORAL_TABLET | Freq: Every day | ORAL | Status: DC
  Administered 2015-12-25: 25 mg via ORAL
  Filled 2015-12-25: qty 1

## 2015-12-25 MED ORDER — PROMETHAZINE HCL 25 MG/ML IJ SOLN
12.5000 mg | Freq: Once | INTRAMUSCULAR | Status: DC
  Filled 2015-12-25: qty 1

## 2015-12-25 MED ORDER — TRAMADOL HCL 50 MG PO TABS: 50.0000 mg | ORAL_TABLET | Freq: Once | ORAL | Status: DC

## 2015-12-25 MED ORDER — TRAMADOL HCL 50 MG PO TABS
50.0000 mg | ORAL_TABLET | Freq: Four times a day (QID) | ORAL | Status: DC | PRN
  Administered 2015-12-25 (×2): 50 mg via ORAL
  Filled 2015-12-25 (×2): qty 1

## 2015-12-25 MED ORDER — DEXTROSE 5 % IV SOLN
1.0000 g | Freq: Once | INTRAVENOUS | Status: AC
  Administered 2015-12-25: 1 g via INTRAVENOUS
  Filled 2015-12-25: qty 10

## 2015-12-25 MED ORDER — DOCUSATE SODIUM 100 MG PO CAPS
100.0000 mg | ORAL_CAPSULE | Freq: Two times a day (BID) | ORAL | Status: DC | PRN
  Administered 2015-12-25: 100 mg via ORAL
  Filled 2015-12-25: qty 1

## 2015-12-25 MED ORDER — TRAMADOL HCL 50 MG PO TABS: 50.0000 mg | ORAL_TABLET | Freq: Three times a day (TID) | ORAL | 0 refills | Status: DC | PRN

## 2015-12-25 MED ORDER — DOLUTEGRAVIR SODIUM 50 MG PO TABS
50.0000 mg | ORAL_TABLET | Freq: Every day | ORAL | Status: DC
  Administered 2015-12-25: 50 mg via ORAL
  Filled 2015-12-25: qty 1

## 2015-12-25 MED ORDER — MORPHINE SULFATE (PF) 4 MG/ML IV SOLN
4.0000 mg | Freq: Once | INTRAVENOUS | Status: AC
  Administered 2015-12-25: 4 mg via INTRAVENOUS
  Filled 2015-12-25: qty 1

## 2015-12-25 MED ORDER — LAMIVUDINE 150 MG PO TABS
300.0000 mg | ORAL_TABLET | Freq: Every day | ORAL | Status: DC
  Administered 2015-12-25: 300 mg via ORAL
  Filled 2015-12-25: qty 2

## 2015-12-25 MED ORDER — SODIUM CHLORIDE 0.9 % IV SOLN: INTRAVENOUS | Status: DC

## 2015-12-25 MED ORDER — CEPHALEXIN 500 MG PO CAPS: 500.0000 mg | ORAL_CAPSULE | Freq: Four times a day (QID) | ORAL | 0 refills | Status: DC

## 2015-12-25 MED ORDER — SENNOSIDES-DOCUSATE SODIUM 8.6-50 MG PO TABS
2.0000 | ORAL_TABLET | Freq: Two times a day (BID) | ORAL | Status: DC
  Administered 2015-12-25: 2 via ORAL
  Filled 2015-12-25: qty 2

## 2015-12-25 MED ORDER — DAPSONE 100 MG PO TABS
100.0000 mg | ORAL_TABLET | Freq: Every day | ORAL | Status: DC
  Administered 2015-12-25: 100 mg via ORAL
  Filled 2015-12-25: qty 1

## 2015-12-25 MED ORDER — ONDANSETRON HCL 4 MG/2ML IJ SOLN: 4.0000 mg | Freq: Four times a day (QID) | INTRAMUSCULAR | Status: DC | PRN

## 2015-12-25 NOTE — ED Notes (Signed)
Attempted report x1. 

## 2015-12-25 NOTE — H&P (Signed)
Family Medicine [facility] Admission History and Physical Service Pager: [phone]  Patient name: Kara Allen Medical record number: [id number] Date of birth: 06/24/1980 Age: 35 y.o. Gender: female  Primary Care Provider: No PCP Per Patient Consultants: none Code Status: FULL  Chief Complaint: generalized abdominal pain  Assessment and Plan: Kara Allen is a 35 y.o. female presenting with generalized abdominal pain. PMH is significant for HIV and recurrent UTIs with VUR, anxiety/depression, mild cognitive impairment.   Flank/abdominal pain: Suspect secondary to Pyelonephritis, but differential includes constipation, ovarian cyst. Discharged on 12/08/15 with 14d of keflex for same presentation, urine culture found to be negative.  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  Pain continues to be 8 out of 10 after receiving 4 mg morphine in the ED and reportedly passed out from pain en route to the ED.  Exam revealed CVA tenderness on the bilaterally and severe generalized tenderness to light palpation of the abdomen. UA with TNTC WBCs and 6-30 epithelial cells on catheterized specimen. Patient afebrile and vital signs are stable. Pregnancy test negative. WBC 3.8, which is about her baseline. Lipase normal.  - Place in observation, Dr. Leveda AnnaHensel attending - defer abdominal imaging at this time, since patient has had multiple abdominal CTs recently - NS@100cc /hr - CTX given in ED, start keflex 500mg  q6H - Pain control with tramadol 50mg  q6H PRN  - zofran PRN nausea - colace and senna for constipation - Patient may benefit from referral to Gyn, as there are multiple notes in her chart that state that her pain is worse with her menstrual cycle. Per chart review, Pt has previously stated that she has seen a gynecologist and they recommended starting birth control pills - Could also consider pelvic exam  in the morning. This was not performed in the middle of the night.  HIV positive: Reportedly follows with Dr. Orvan Falconerampbell and states that she is compliant with her medications and patient has no PCP.  Last CD4 count was 100 on 7/17.  Last seen in clinic on 08/03/15 and patient was missing 2 doses in the 2 weeks prior. Denies any fevers, weight loss, cough or recent illnesses. Quant 9,800, CD4 count 140.  - Continue tivicay 50mg  daily, epivir 300mg  daily and edurant 25mg  daily - Dapsone 100mg  daily for Pneumocystis prophylaxis.  Hyponatremia, acute: Na 127 on admission, previously in 134-136. Possible side effect of tramadol. She could also be hyponatremic in the setting of volume depletion given her recent vomiting. Will observe on BMP and consider alternative pain relief if necessary.  - monitor on am BMP - Will also obtain AM TSH to r/u hypothyroidism as a cause  Recurrent UTI with vesico-ureteral reflex:  Complex history of multiple UTI and VUR with prior stent placement by Urologist Dr. Isabel CapriceGrapey.  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  - Will need outpatient follow up with Urology, scheduled 01/04/16  FEN/GI: regular diet Prophylaxis: SCDs  Disposition: observe, anticipate discharge home this morning  History of Present Illness:  Kara Allen is a 35 y.o. female presenting with lower abdominal pain, this is chronic issue for this patient. She was hospitalized 12/06/15 and was discharged with 14 total days of Keflex. She stopped taking the antibiotics 1 week ago. While she was on the antibiotics and Tramadol, the pain never completely went away, but  got much better. The pain started slowly coming back. She was trying to deal with the pain until after Christmas, but she wasn't able to. The pain is located at the bottom of her abdomen and in her lower back. The pain feels like "something is kicking  her stomach". The pain is worse with breathing. She is unable to get in with her urologist until January 3rd. She has not have any fevers recently. She has had nausea and vomiting x 4 today. No dysuria. She states that she pees for a long time. She has occasional frequency. Her urine smells like "old urine". Her last BM was two days ago. Her bowel movements have been hard and she has had to strain. She has been prescribed stool softeners, but just picked up the prescription today. Feels that she could probably "suck it up" and go home with more PO antibiotics and tramadol.   Review Of Systems: Per HPI with the following additions: none  Review of Systems  Constitutional: Negative for chills and fever.  Respiratory: Negative for cough.   Cardiovascular: Negative for chest pain.  Gastrointestinal: Positive for abdominal pain, blood in stool, nausea and vomiting.  Genitourinary: Positive for frequency. Negative for dysuria, hematuria and urgency.  Musculoskeletal: Positive for back pain.  Skin: Negative for rash.  Neurological: Positive for dizziness.    Patient Active Problem List   Diagnosis Date Noted  . Positive Murphy's Sign   . Right upper quadrant abdominal pain   . Pyelonephritis 12/06/2015  . Elevated lactic acid level   . Flank pain   . Lower abdominal pain   . Gum inflammation 06/02/2014  . Pelvic pain in female   . Left ovarian cyst   . Back pain   . 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 HIV (human immunodeficiency virus infection) (HCC) 10/01/2013  . Hydronephrosis, left 10/01/2013  . BOILS, RECURRENT 02/12/2008  . ANXIETY 01/30/2006  . CIGARETTE SMOKER 01/30/2006  . Depression 01/30/2006  . Mild cognitive impairment 01/30/2006  . ALLERGIC RHINITIS 01/30/2006  . SYMPTOM, ENURESIS, NOCTURNAL 01/30/2006    Past Medical History: Past Medical History:  Diagnosis Date  . Cyst of ovary    PT STATES SHE IS TAKING ANTIBIOTICS FOR CYST ON OVARY  . HIV (human  immunodeficiency virus infection) (HCC)   . Hydronephrosis, left   . Pyelonephritis   . Recurrent boils   . Trichomonas infection   . Urinary frequency    and nocturia    Past Surgical History: Past Surgical History:  Procedure Laterality Date  . CESAREAN SECTION  2006  . CYSTOSCOPY WITH RETROGRADE PYELOGRAM, URETEROSCOPY AND STENT PLACEMENT Bilateral 10/28/2013   Procedure: CYSTOSCOPY WITH RETROGRADE PYELOGRAM, CYSTOGRAM;  Surgeon: Valetta Fuller, MD;  Location: [facility];  Service: Urology;  Laterality: Bilateral;  . LAPAROSCOPIC UNILATERAL SALPINGO OOPHERECTOMY Left 01/05/2014   Procedure: LAPAROSCOPIC LEFT SALPINGO OOPHORECTOMY;  Surgeon: Catalina Antigua, MD;  Location: [facility];  Service: Gynecology;  Laterality: Left;    Social History: Social History  Substance Use Topics  . Smoking status: Current Every Day Smoker    Packs/day: 0.10    Years: 14.00    Types: Cigarettes, Cigars  . Smokeless tobacco: Never Used     Comment: smoking 1-2 black and milds per day  . Alcohol use No     Comment:  - does smoke tobacco product called blackmilds   Additional social history: None Please also refer to relevant sections of EMR.  Family History: Family History  Problem Relation Age  of Onset  . HIV Mother     Allergies and Medications: Allergies  Allergen Reactions  . Ibuprofen Rash and Other (See Comments)    "Irritates my bladder"  . Latex Other (See Comments)    If pt wears latex gloves or any contact with latex causes itching and rash. Some tapes also cause itching, skin irritation - paper tape seems to be okay  . Sulfamethoxazole-Trimethoprim Other (See Comments)    Rash, chills and hives  . Tylenol [Acetaminophen] Other (See Comments)    Makes me pee more- irritates bladder   No current facility-administered medications on file prior to encounter.    Current Outpatient Prescriptions on File Prior to Encounter  Medication Sig Dispense Refill  . dapsone 100 MG tablet Take  1 tablet (100 mg total) by mouth daily. 30 tablet 11  . docusate sodium (COLACE) 100 MG capsule Take 1 capsule (100 mg total) by mouth 2 (two) times daily. (Patient taking differently: Take 100 mg by mouth 2 (two) times daily as needed for mild constipation. ) 10 capsule 0  . EDURANT 25 MG TABS tablet TAKE ONE TABLET BY MOUTH EVERY MORNING WITH BREAKFAST 30 tablet 2  . EPIVIR 10 MG/ML solution TAKE 30 MILLILITERS (2 TABLESPOONFULS) BY MOUTH ONCE DAILY 960 mL 2  . ondansetron (ZOFRAN ODT) 4 MG disintegrating tablet Take 1 tablet (4 mg total) by mouth every 8 (eight) hours as needed for nausea or vomiting. 10 tablet 0  . TIVICAY 50 MG tablet TAKE ONE (1) TABLET BY MOUTH EVERY DAY 30 tablet 2    Objective: BP 100/79 (BP Location: Right Arm)   Pulse 82   Temp 98.3 F (36.8 C) (Oral)   Resp 22   LMP 11/27/2015   SpO2 99%  Exam: General: Overweight female resting in bed, appearing in pain, tearful.  Eyes: EOMI, PERRLA ENTM: MMM Neck: supple Cardiovascular: RRR, no m/r/g Respiratory: CTAB, easy WOB Gastrointestinal: soft, nondistended, diffusely tender to palpation, even with distraction, + BS. +CVAT bilaterally MSK: moves all extremities spontaneously  Derm: no rashes on visualized skin  Neuro: CN II-XII grossly intact, sensation intact. Alert and oriented.  Psych: mood and affect appropriate, but tearful  Labs and Imaging: CBC BMET   Recent Labs Lab 12/24/15 2203  WBC 3.6*  HGB 12.6  HCT 37.3  PLT 197    Recent Labs Lab 12/24/15 2203  NA 127*  K 3.8  CL 98*  CO2 20*  BUN 10  CREATININE 0.69  GLUCOSE 114*  CALCIUM 9.2     Lipase WNL. Beta Hcg negative.   Garth BignessKathryn Timberlake, MD 12/25/2015, 2:16 AM PGY-1, [facility] FPTS Intern pager: [phone], text pages welcome

## 2015-12-25 NOTE — Discharge Summary (Signed)
[facility] Summary  Patient name: Kara Allen Medical record number: [id number] Date of birth: 11/09/1980 Age: 35 y.o. Gender: female Date of Admission: 12/24/2015  Date of Discharge: 12/25/15 Admitting Physician: Moses MannersWilliam A Hensel, MD  Primary Care Provider: No PCP Per Patient Consultants: None  Indication for Hospitalization: UTI, lower abdominal pain  Discharge Diagnoses/Problem List:  UTI Chronic lower abdominal pain Hx VUR w/ multiple UTIs HIV  Disposition: Home  Discharge Condition: Stable, improved  Discharge Exam:  General: Sitting up in bed, in NAD HEENT: EOMI, MMM Neck: supple Cardiovascular: RRR, no m/r/g Respiratory: CTAB, normal work of breathing Gastrointestinal: soft, nondistended, diffusely tender to palpation, even with distraction, + BS.  MSK: moves all extremities spontaneously  Derm: no rashes on visualized skin  Neuro: CN II-XII grossly intact, sensation intact. Alert and oriented.  Psych: mood and affect appropriate  Brief Hospital Course:  [name] is a 35 year old female with a PMH of HIV, VUR w/ multiple UTIs and pyelonephritis in the past presenting to the ED with lower abdominal pain for the last week. She was previously hospitalized at the beginning of the month for pyelo. She was discharged on a 14 day course of Keflex and some Tramadol for pain. She returns with new pain after stopping these medications. UA was consistent with a UTI, with TNTC WBCs and large LE. She was given CTX x 1 in the ED. She was transitioned to Keflex on admission and was discharged on this medication to last her until her Urology follow-up appointment on 01/04/2016.  Issues for Follow Up:  1. Please follow-up urine culture and make sure Keflex was the appropriate antibiotic choice 2. Pt has been on Tramadol for lower abdominal pain. Would not recommend continuing this as a chronic medication.  Significant Procedures:  None  Significant Labs and Imaging:   Recent Labs Lab 12/24/15 2203  WBC 3.6*  HGB 12.6  HCT 37.3  PLT 197    Recent Labs Lab 12/24/15 2203 12/25/15 0718  NA 127* 133*  K 3.8 3.8  CL 98* 105  CO2 20* 22  GLUCOSE 114* 97  BUN 10 8  CREATININE 0.69 0.61  CALCIUM 9.2 9.3  ALKPHOS 64  --   AST 26  --   ALT 16  --   ALBUMIN 3.6  --    Lipase WNL. Beta Hcg negative.   Results/Tests Pending at Time of Discharge: Urine culture  Discharge Medications:  Allergies as of 12/25/2015      Reactions   Ibuprofen Rash, Other (See Comments)   "Irritates my bladder"   Latex Other (See Comments)   If pt wears latex gloves or any contact with latex causes itching and rash. Some tapes also cause itching, skin irritation - paper tape seems to be okay   Sulfamethoxazole-trimethoprim Other (See Comments)   Rash, chills and hives   Tylenol [acetaminophen] Other (See Comments)   Makes me pee more- irritates bladder      Medication List    TAKE these medications   cephALEXin 500 MG capsule Commonly known as:  KEFLEX Take 1 capsule (500 mg total) by mouth 4 (four) times daily.   dapsone 100 MG tablet Take 1 tablet (100 mg total) by mouth daily.   docusate sodium 100 MG capsule Commonly known as:  COLACE Take 1 capsule (100 mg total) by mouth 2 (two) times daily. What changed:  when to take this  reasons to take this   EDURANT 25  MG Tabs tablet Generic drug:  rilpivirine TAKE ONE TABLET BY MOUTH EVERY MORNING WITH BREAKFAST   EPIVIR 10 MG/ML solution Generic drug:  lamiVUDine TAKE 30 MILLILITERS (2 TABLESPOONFULS) BY MOUTH ONCE DAILY   ondansetron 4 MG disintegrating tablet Commonly known as:  ZOFRAN ODT Take 1 tablet (4 mg total) by mouth every 8 (eight) hours as needed for nausea or vomiting.   TIVICAY 50 MG tablet Generic drug:  dolutegravir TAKE ONE (1) TABLET BY MOUTH EVERY DAY   traMADol 50 MG tablet Commonly known as:  ULTRAM Take 1 tablet (50 mg total) by  mouth 3 (three) times daily as needed for severe pain.       Discharge Instructions: Please refer to Patient Instructions section of EMR for full details.  Patient was counseled important signs and symptoms that should prompt return to medical care, changes in medications, dietary instructions, activity restrictions, and follow up appointments.   Follow-Up Appointments:   Campbell StallKaty Dodd Jerlene Rockers, MD 12/25/2015, 9:05 AM PGY-2, [facility]

## 2015-12-25 NOTE — Discharge Instructions (Signed)
You were hospitalized for another UTI. We are discharging you with Keflex 500mg  four times a day and Tramadol every 8 hours as needed for pain. Please make sure you go to your follow-up visit with the urologist. We will also start you on amitriptyline for your chronic pain, you can start  With 1 pill and slowly increase to taking it twice daily and then three times daily with time.

## 2015-12-25 NOTE — Progress Notes (Signed)
FPTS Interim Progress Note  S: Pt states she is feeling much better this morning and would like to go home. Her pain has improved.  O: BP 106/67 (BP Location: Right Arm)   Pulse 67   Temp 98.2 F (36.8 C) (Oral)   Resp 20   Ht 5\' 1"  (1.549 m)   Wt 155 lb (70.3 kg)   LMP 11/27/2015   SpO2 100%   BMI 29.29 kg/m   General: Sitting up in bed, in NAD HEENT: EOMI, MMM Neck: supple Cardiovascular: RRR, no m/r/g Respiratory: CTAB, normal work of breathing Gastrointestinal: soft, nondistended, diffusely tender to palpation, even with distraction, + BS.  MSK: moves all extremities spontaneously  Derm: no rashes on visualized skin  Neuro: CN II-XII grossly intact, sensation intact. Alert and oriented.  Psych: mood and affect appropriate  A/P: Kara Allen is a 35 y.o. female presenting with generalized abdominal pain. PMH is significant for HIV and recurrent UTIs with VUR, anxiety/depression, mild cognitive impairment.   Flank/abdominal pain: Pyelo most likely with UA infectious-appearing. 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  - Given CTX x 1 in the ED, switched to Keflex 500mg  qid on admission. Will need to continue this until seen by Urology. - Defer abdominal imaging at this time, since patient has had multiple abdominal CTs recently - Urine culture pending - NS@100cc /hr - Pain control with tramadol 50mg  q6H PRN  - zofran PRN nausea - colace and senna for constipation  HIV positive: Reportedly follows with Dr. Orvan Falconerampbell and states that she is compliant with her medications and patient has no PCP. Quant 9,800, CD4 count 140.  - Continue tivicay 50mg  daily, epivir 300mg  daily and edurant 25mg  daily - Dapsone 100mg  dailyfor Pneumocystis prophylaxis.  Hyponatremia, acute, resolved: Na 127 on admission, previously in 134-136 > 133 this morning.  Possible side effect of tramadol. She could also be hyponatremic in the setting of volume depletion given her recent vomiting. Will observe on BMP and consider alternative pain relief if necessary. TSH normal.  Recurrent UTI with vesico-ureteral reflex: Complex history of multiple UTI and VUR with prior stent placement by Urologist Dr. Isabel CapriceGrapey. 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  - Has outpatient appointment with Urology, scheduled 01/04/16  Campbell StallKaty Dodd Mayo, MD 12/25/2015, 6:38 AM PGY-2, [facility] pager [phone]

## 2015-12-26 LAB — URINE CULTURE

## 2016-01-17 ENCOUNTER — Encounter (HOSPITAL_COMMUNITY): Payer: Self-pay | Admitting: Emergency Medicine

## 2016-01-17 ENCOUNTER — Emergency Department (HOSPITAL_COMMUNITY)
Admission: EM | Admit: 2016-01-17 | Discharge: 2016-01-17 | Disposition: A | Discharge status: Home / Self Care | Payer: Medicaid Other | Attending: Emergency Medicine | Admitting: Emergency Medicine

## 2016-01-17 DIAGNOSIS — F17210 Nicotine dependence, cigarettes, uncomplicated: Secondary | ICD-10-CM | POA: Diagnosis not present

## 2016-01-17 DIAGNOSIS — A599 Trichomoniasis, unspecified: Secondary | ICD-10-CM

## 2016-01-17 DIAGNOSIS — Z91040 Latex allergy status: Secondary | ICD-10-CM | POA: Insufficient documentation

## 2016-01-17 DIAGNOSIS — B9689 Other specified bacterial agents as the cause of diseases classified elsewhere: Secondary | ICD-10-CM | POA: Insufficient documentation

## 2016-01-17 DIAGNOSIS — R1084 Generalized abdominal pain: Secondary | ICD-10-CM | POA: Diagnosis present

## 2016-01-17 DIAGNOSIS — Z79899 Other long term (current) drug therapy: Secondary | ICD-10-CM | POA: Insufficient documentation

## 2016-01-17 DIAGNOSIS — N760 Acute vaginitis: Secondary | ICD-10-CM | POA: Insufficient documentation

## 2016-01-17 LAB — COMPREHENSIVE METABOLIC PANEL
ALBUMIN: 3.5 g/dL (ref 3.5–5.0)
ALK PHOS: 68 U/L (ref 38–126)
ALT: 29 U/L (ref 14–54)
ANION GAP: 9 (ref 5–15)
AST: 33 U/L (ref 15–41)
BUN: 8 mg/dL (ref 6–20)
CHLORIDE: 103 mmol/L (ref 101–111)
CO2: 19 mmol/L — AB (ref 22–32)
Calcium: 9.1 mg/dL (ref 8.9–10.3)
Creatinine, Ser: 0.62 mg/dL (ref 0.44–1.00)
GFR calc non Af Amer: >60 mL/min (ref >60)
GLUCOSE: 89 mg/dL (ref 65–99)
Potassium: 4.3 mmol/L (ref 3.5–5.1)
SODIUM: 131 mmol/L — AB (ref 135–145)
Total Bilirubin: 0.4 mg/dL (ref 0.3–1.2)
Total Protein: 9.5 g/dL — ABNORMAL HIGH (ref 6.5–8.1)

## 2016-01-17 LAB — URINALYSIS, ROUTINE W REFLEX MICROSCOPIC
Bilirubin Urine: NEGATIVE
GLUCOSE, UA: NEGATIVE mg/dL
Ketones, ur: NEGATIVE mg/dL
Nitrite: NEGATIVE
PROTEIN: NEGATIVE mg/dL
Specific Gravity, Urine: 1.01 (ref 1.005–1.030)
pH: 7 (ref 5.0–8.0)

## 2016-01-17 LAB — WET PREP, GENITAL
Sperm: NONE SEEN
Yeast Wet Prep HPF POC: NONE SEEN

## 2016-01-17 LAB — CBC
HEMATOCRIT: 37.2 % (ref 36.0–46.0)
HEMOGLOBIN: 12.7 g/dL (ref 12.0–15.0)
MCH: 31.2 pg (ref 26.0–34.0)
MCHC: 34.1 g/dL (ref 30.0–36.0)
MCV: 91.4 fL (ref 78.0–100.0)
Platelets: 128 10*3/uL — ABNORMAL LOW (ref 150–400)
RBC: 4.07 MIL/uL (ref 3.87–5.11)
RDW: 14.4 % (ref 11.5–15.5)
WBC: 2.6 10*3/uL — ABNORMAL LOW (ref 4.0–10.5)

## 2016-01-17 LAB — RPR: RPR: NONREACTIVE

## 2016-01-17 MED ORDER — METRONIDAZOLE 500 MG PO TABS
2000.0000 mg | ORAL_TABLET | Freq: Once | ORAL | Status: AC
  Administered 2016-01-17: 2000 mg via ORAL
  Filled 2016-01-17: qty 4

## 2016-01-17 MED ORDER — CEFTRIAXONE SODIUM 250 MG IJ SOLR
250.0000 mg | Freq: Once | INTRAMUSCULAR | Status: AC
  Administered 2016-01-17: 250 mg via INTRAMUSCULAR
  Filled 2016-01-17: qty 250

## 2016-01-17 MED ORDER — HYDROMORPHONE HCL 2 MG/ML IJ SOLN
1.0000 mg | Freq: Once | INTRAMUSCULAR | Status: AC
  Administered 2016-01-17: 1 mg via INTRAVENOUS
  Filled 2016-01-17: qty 1

## 2016-01-17 MED ORDER — AZITHROMYCIN 250 MG PO TABS
1000.0000 mg | ORAL_TABLET | Freq: Once | ORAL | Status: AC
  Administered 2016-01-17: 1000 mg via ORAL
  Filled 2016-01-17: qty 4

## 2016-01-17 MED ORDER — ONDANSETRON 4 MG PO TBDP
8.0000 mg | ORAL_TABLET | Freq: Once | ORAL | Status: AC
  Administered 2016-01-17: 8 mg via ORAL
  Filled 2016-01-17: qty 2

## 2016-01-17 NOTE — Discharge Instructions (Signed)
Please schedule appointment with Dr. Isabel CapriceGrapey in 2-5 days if symptoms worsen. Please schedule appointment with Dr. Orvan Falconerampbell as needed for follow-up. Please always practice protected sex with protective barriers. He can use Tylenol and/or ibuprofen every 6 hours as needed for pain.   Get help right away if: You still have symptoms after you finish your medicine. You develop abdominal pain. You have pain when you urinate. You have bleeding after sexual intercourse. You develop a rash. Your medicine makes you sick or makes you throw up (vomit). You have severe or persistent: Headache. Ear pain. Sinus pain. Chest pain. You have chronic lung disease and any of the following: Wheezing. Prolonged cough. Coughing up blood. A change in your usual mucus. You have a stiff neck. You have changes in your: Vision. Hearing. Thinking. Mood.

## 2016-01-17 NOTE — ED Notes (Signed)
Chaperoned [place], [place] with pelvic examination and collection of vaginal swabs; sent to lab for testing

## 2016-01-17 NOTE — ED Triage Notes (Addendum)
Pt to ED for lower abdominal pain. Pt has a Hx of this same pain. Pt states she has blood in her stool, but has a Hx of hemorrhoids. Pt sates she is nauseated, but no episodes of emesis. Pt states she took an antiemetic before coming in. 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

## 2016-01-17 NOTE — ED Notes (Signed)
Pt sts some nausea; no vomit noted at present

## 2016-01-17 NOTE — ED Provider Notes (Signed)
[facility] Provider Note   CSN: [id number] Arrival date & time: 01/17/16  0620     History   Chief Complaint Chief Complaint  Patient presents with  . Abdominal Pain    HPI Kara Allen is a 36 y.o. female history of HIV +, generalized abdominal pain and recent history of 2 encounters of diagnosed pyelonephritis on 12/24/15 and 12/06/15 presents with complaints of lower abdominal pain right more than left started yesterday. Patient reports he dull, constant, and bouts of sharp pain radiating to her abdomen and currently a 8/10. Patient reports associated back pain, nausea, and frequency. Patient denies fevers, chills, dysuria. She reports sitting up alleviates pain and lying down aggravates her pain. She states nothing else makes the pain worse or better. She states that her symptoms feel similar to last visit not as intense. She states that she took all her medications as prescribed last visit. Patient endorses constipation, history of hemorrhoids, and blood in stool that she states is not a new finding and chronic for her. She states that she was given medication to help with her chronic constipation last time she was here and has been helping her. She denies having any PCP at this time. 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 Patient reports HIV positive status and currently being monitored by Dr. Orvan Falconer states that right now her viral load is undetectable. She states that he she is regularly seen by him and takes all her medications as prescribed. Prior abdominal surgeries include left oophorectomy and salpingectomy secondary to ovarian cyst and C-section. Last 2 times she was here at the hospital for pyelonephritis she was admitted and given IV antibiotics. She states both times she felt better after she left. Patient was supposed to have a follow-up appointment with the nephrologist on January 3. She states that she missed  her appointment on January 3 and once the urologist yesterday and says she was not prescribed any antibiotics. Patient denies chest pain, shortness of breath, fevers, chills, vomiting, vaginal pain, vaginal bleeding, changes in bowel movements, numbness, weakness. Patient states that she has not had any intercourse in the last 3 months. She states that she knows that if she does she needs to inform any and all partners and needs to use condoms or other sources of barriers for protection.   The history is provided by the patient and medical records. No language interpreter was used.  Abdominal Pain   Associated symptoms include nausea, constipation and frequency. Pertinent negatives include fever, vomiting and dysuria.    Past Medical History:  Diagnosis Date  . Cyst of ovary    PT STATES SHE IS TAKING ANTIBIOTICS FOR CYST ON OVARY  . HIV (human immunodeficiency virus infection) (HCC)   . Hydronephrosis, left   . Pyelonephritis   . Recurrent boils   . Trichomonas infection   . Urinary frequency    and nocturia    Patient Active Problem List   Diagnosis Date Noted  . Generalized abdominal pain   . Hyponatremia   . Leukopenia   . Positive Murphy's Sign   . Right upper quadrant abdominal pain   . Pyelonephritis 12/06/2015  . Elevated lactic acid level   . Flank pain   . Lower abdominal pain   . Gum inflammation 06/02/2014  . Pelvic pain in female   . Left ovarian cyst   . Back pain   . 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  HIV (human immunodeficiency virus infection) (HCC) 10/01/2013  . Hydronephrosis, left 10/01/2013  . BOILS, RECURRENT 02/12/2008  . ANXIETY 01/30/2006  . CIGARETTE SMOKER 01/30/2006  . Depression 01/30/2006  . Mild cognitive impairment 01/30/2006  . ALLERGIC RHINITIS 01/30/2006  . SYMPTOM, ENURESIS, NOCTURNAL 01/30/2006    Past Surgical History:  Procedure Laterality Date  . CESAREAN SECTION  2006  . CYSTOSCOPY WITH RETROGRADE PYELOGRAM, URETEROSCOPY AND  STENT PLACEMENT Bilateral 10/28/2013   Procedure: CYSTOSCOPY WITH RETROGRADE PYELOGRAM, CYSTOGRAM;  Surgeon: Valetta Fuller, MD;  Location: [facility];  Service: Urology;  Laterality: Bilateral;  . LAPAROSCOPIC UNILATERAL SALPINGO OOPHERECTOMY Left 01/05/2014   Procedure: LAPAROSCOPIC LEFT SALPINGO OOPHORECTOMY;  Surgeon: Catalina Antigua, MD;  Location: [facility];  Service: Gynecology;  Laterality: Left;    OB History    Gravida Para Term Preterm AB Living   2 1 1   1 1    SAB TAB Ectopic Multiple Live Births   1               Home Medications    Prior to Admission medications   Medication Sig Start Date End Date Taking? Authorizing Provider  amitriptyline (ELAVIL) 25 MG tablet Take 1 tablet for 5 days, then twice daily in the AM/PM for 5 days, then increase to three times daily Patient taking differently: Take 25 mg by mouth 3 (three) times daily.  12/25/15  Yes Asiyah Mayra Reel, MD  cephALEXin (KEFLEX) 500 MG capsule Take 1 capsule (500 mg total) by mouth 4 (four) times daily. 12/25/15  Yes Campbell Stall, MD  docusate sodium (COLACE) 100 MG capsule Take 1 capsule (100 mg total) by mouth 2 (two) times daily. Patient taking differently: Take 100 mg by mouth 2 (two) times daily as needed for mild constipation.  12/08/15  Yes Renne Musca, MD  EDURANT 25 MG TABS tablet TAKE ONE TABLET BY MOUTH EVERY MORNING WITH BREAKFAST 12/13/15  Yes Cliffton Asters, MD  EPIVIR 10 MG/ML solution TAKE 30 MILLILITERS (2 TABLESPOONFULS) BY MOUTH ONCE DAILY 12/13/15  Yes Cliffton Asters, MD  ondansetron (ZOFRAN ODT) 4 MG disintegrating tablet Take 1 tablet (4 mg total) by mouth every 8 (eight) hours as needed for nausea or vomiting. 11/21/15  Yes Pricilla Loveless, MD  TIVICAY 50 MG tablet TAKE ONE (1) TABLET BY MOUTH EVERY DAY 12/13/15  Yes Cliffton Asters, MD  dapsone 100 MG tablet Take 1 tablet (100 mg total) by mouth daily. Patient not taking: Reported on 01/17/2016 08/03/15   Cliffton Asters, MD  traMADol (ULTRAM) 50 MG  tablet Take 1 tablet (50 mg total) by mouth 3 (three) times daily as needed. Patient not taking: Reported on 01/17/2016 12/25/15   Campbell Stall, MD    Family History Family History  Problem Relation Age of Onset  . HIV Mother     Social History Social History  Substance Use Topics  . Smoking status: Current Every Day Smoker    Packs/day: 0.10    Years: 14.00    Types: Cigarettes, Cigars  . Smokeless tobacco: Never Used     Comment: smoking 1-2 black and milds per day  . Alcohol use No     Comment:  - does smoke tobacco product called blackmilds     Allergies   Ibuprofen; Latex; Sulfamethoxazole-trimethoprim; and Tylenol [acetaminophen]   Review of Systems Review of Systems  Constitutional: Negative for chills and fever.  HENT: Negative for sore throat and trouble swallowing.   Respiratory: Negative for shortness of breath.  Cardiovascular: Negative for chest pain.  Gastrointestinal: Positive for abdominal pain, blood in stool, constipation and nausea. Negative for vomiting.  Genitourinary: Positive for frequency. Negative for difficulty urinating and dysuria.  Musculoskeletal: Positive for back pain.  Skin: Negative for rash and wound.  Neurological: Negative for weakness and numbness.     Physical Exam Updated Vital Signs BP 106/85   Pulse 91   Temp 98.1 F (36.7 C) (Oral)   Resp 18   Ht 5\' 1"  (1.549 m)   Wt 68 kg   LMP 12/26/2015 (Approximate)   SpO2 100%   BMI 28.34 kg/m   Physical Exam  Constitutional: She is oriented to person, place, and time. She appears well-developed and well-nourished.  Appears uncomfortable.  HENT:  Head: Normocephalic and atraumatic.  Nose: Nose normal.  Mouth/Throat: Oropharynx is clear and moist.  Eyes: Conjunctivae and EOM are normal. Pupils are equal, round, and reactive to light.  Neck: Normal range of motion. Neck supple.  Cardiovascular: Normal rate, normal heart sounds and intact distal pulses.   Pulmonary/Chest:  Effort normal and breath sounds normal. No respiratory distress. She exhibits no tenderness.  Abdominal: Soft. Bowel sounds are normal. There is generalized tenderness. There is guarding and CVA tenderness. There is no rebound. Hernia confirmed negative in the right inguinal area and confirmed negative in the left inguinal area.  Generalized abdominal pain. More tenderness to the lower abdomen.  Genitourinary: Rectal exam shows external hemorrhoid. Pelvic exam was performed with patient supine. No labial fusion. There is no rash, tenderness, lesion or injury on the right labia. There is no rash, tenderness, lesion or injury on the left labia. Cervix exhibits motion tenderness and discharge. Right adnexum displays tenderness. Right adnexum displays no mass. Left adnexum displays no mass, no tenderness and no fullness. No erythema, tenderness or bleeding in the vagina. No foreign body in the vagina. No signs of injury around the vagina. Vaginal discharge found.  Genitourinary Comments: Chaperone present for pelvic exam.   Musculoskeletal: Normal range of motion.  Neurological: She is alert and oriented to person, place, and time.  Skin: Skin is warm. Capillary refill takes less than 2 seconds.  Psychiatric: She has a normal mood and affect. Her behavior is normal.  Nursing note and vitals reviewed.    ED Treatments / Results  Labs (all labs ordered are listed, but only abnormal results are displayed) Labs Reviewed  WET PREP, GENITAL - Abnormal; Notable for the following:       Result Value   Trich, Wet Prep PRESENT (*)    Clue Cells Wet Prep HPF POC PRESENT (*)    WBC, Wet Prep HPF POC FEW (*)    All other components within normal limits  COMPREHENSIVE METABOLIC PANEL - Abnormal; Notable for the following:    Sodium 131 (*)    CO2 19 (*)    Total Protein 9.5 (*)    All other components within normal limits  CBC - Abnormal; Notable for the following:    WBC 2.6 (*)    Platelets 128 (*)     All other components within normal limits  URINALYSIS, ROUTINE W REFLEX MICROSCOPIC - Abnormal; Notable for the following:    APPearance HAZY (*)    Hgb urine dipstick SMALL (*)    Leukocytes, UA MODERATE (*)    Bacteria, UA FEW (*)    Squamous Epithelial / LPF 0-5 (*)    All other components within normal limits  URINE CULTURE  RPR  HIV  ANTIBODY (ROUTINE TESTING)  GC[facility]    EKG  EKG Interpretation None       Radiology No results found.  Procedures Procedures (including critical care time)  Medications Ordered in ED Medications  HYDROmorphone (DILAUDID) injection 1 mg (1 mg Intravenous Given 01/17/16 0820)  ondansetron (ZOFRAN-ODT) disintegrating tablet 8 mg (8 mg Oral Given 01/17/16 0955)  cefTRIAXone (ROCEPHIN) injection 250 mg (250 mg Intramuscular Given 01/17/16 1045)  azithromycin (ZITHROMAX) tablet 1,000 mg (1,000 mg Oral Given 01/17/16 1045)  metroNIDAZOLE (FLAGYL) tablet 2,000 mg (2,000 mg Oral Given 01/17/16 1045)     Initial Impression / Assessment and Plan / ED Course  I have reviewed the triage vital signs and the nursing notes.  Pertinent labs & imaging results that were available during my care of the patient were reviewed by me and considered in my medical decision making (see chart for details).  Clinical Course   Patient to be discharged with instructions to follow up with OBGYN. Discussed importance of using protection when sexually active. She states she regularly sees her doctor who monitors her HIV status. Last time she was there stated that are thought was undetectable. Pt understands that they have GC/Chlamydia cultures pending and that they will need to inform all sexual partners if results return positive. Blood work similar to previous findings and did not show any acute processes. Urinalysis does show evidence of leukocytes, WBC's, bacteria that likely due to a combination of her normal flora and from positive  trichomonas, BV, and possible Gc/chlamydia.  Pt has been treated prophylacticly with azithromycin and rocephin due to pts history, pelvic exam, wet prep, and positive trichomonas with increased WBCs. Pt has also been treated with flagyl for Trichomonas and Bacterial Vaginosis. Pt has been advised to not drink alcohol while on this medication. Return precautions given for any new or worsening symptoms.    Final Clinical Impressions(s) / ED Diagnoses   Final diagnoses:  Trichomoniasis  Bacterial vaginosis    New Prescriptions Discharge Medication List as of 01/17/2016 10:51 AM       [address] [place], [place] 01/17/16 1538    Derwood KaplanAnkit Nanavati, MD 01/18/16 [phone]

## 2016-01-18 ENCOUNTER — Encounter (HOSPITAL_COMMUNITY): Payer: Self-pay

## 2016-01-18 ENCOUNTER — Emergency Department (HOSPITAL_COMMUNITY)
Admission: EM | Admit: 2016-01-18 | Discharge: 2016-01-18 | Disposition: A | Discharge status: Home / Self Care | Payer: Medicaid Other | Attending: Emergency Medicine | Admitting: Emergency Medicine

## 2016-01-18 DIAGNOSIS — R109 Unspecified abdominal pain: Secondary | ICD-10-CM

## 2016-01-18 DIAGNOSIS — R1030 Lower abdominal pain, unspecified: Secondary | ICD-10-CM | POA: Diagnosis present

## 2016-01-18 DIAGNOSIS — G8929 Other chronic pain: Secondary | ICD-10-CM | POA: Insufficient documentation

## 2016-01-18 DIAGNOSIS — R1084 Generalized abdominal pain: Secondary | ICD-10-CM | POA: Diagnosis not present

## 2016-01-18 DIAGNOSIS — F17290 Nicotine dependence, other tobacco product, uncomplicated: Secondary | ICD-10-CM | POA: Insufficient documentation

## 2016-01-18 DIAGNOSIS — Z91040 Latex allergy status: Secondary | ICD-10-CM | POA: Insufficient documentation

## 2016-01-18 LAB — URINE CULTURE

## 2016-01-18 LAB — HIV 1/2 AB DIFFERENTIATION
HIV 1 Ab: POSITIVE — AB
HIV 2 AB: NEGATIVE

## 2016-01-18 LAB — HIV ANTIBODY (ROUTINE TESTING W REFLEX)

## 2016-01-18 MED ORDER — TRAMADOL HCL 50 MG PO TABS: 50.0000 mg | ORAL_TABLET | Freq: Four times a day (QID) | ORAL | 0 refills | Status: DC | PRN

## 2016-01-18 MED ORDER — TRAMADOL HCL 50 MG PO TABS
50.0000 mg | ORAL_TABLET | Freq: Once | ORAL | Status: AC
  Administered 2016-01-18: 50 mg via ORAL
  Filled 2016-01-18: qty 1

## 2016-01-18 NOTE — ED Notes (Signed)
Pt now complaining of nausea; emesis bag provided

## 2016-01-18 NOTE — ED Provider Notes (Signed)
[facility] Provider Note   CSN: [id number] Arrival date & time: 01/18/16  0050     History   Chief Complaint Chief Complaint  Patient presents with  . Abdominal Pain    HPI Kara Allen is a 36 y.o. female.  Patient returns to the emergency department for evaluation of abdominal pain in the lower abdomen, c/w her chronic abdominal pain. She was seen yesterday for the same and was diagnosed and treated for trichomonas. She does not feel this was the source of her pain. She states further that "I come up here a lot" and during the course of a previous visit, the provider discussed that she was being started on medication for IBS, and that there were other medications that could be tried if there was no relief. She states she was under the impression she needed to return to the emergency department for change of treatment. No fever, vomiting, dysuria, SOB, CP.    The history is provided by the patient. No language interpreter was used.  Abdominal Pain   Pertinent negatives include fever.    Past Medical History:  Diagnosis Date  . Cyst of ovary    PT STATES SHE IS TAKING ANTIBIOTICS FOR CYST ON OVARY  . HIV (human immunodeficiency virus infection) (HCC)   . Hydronephrosis, left   . Pyelonephritis   . Recurrent boils   . Trichomonas infection   . Urinary frequency    and nocturia    Patient Active Problem List   Diagnosis Date Noted  . Generalized abdominal pain   . Hyponatremia   . Leukopenia   . Positive Murphy's Sign   . Right upper quadrant abdominal pain   . Pyelonephritis 12/06/2015  . Elevated lactic acid level   . Flank pain   . Lower abdominal pain   . Gum inflammation 06/02/2014  . Pelvic pain in female   . Left ovarian cyst   . Back pain   . 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 HIV (human immunodeficiency virus infection) (HCC) 10/01/2013  . Hydronephrosis, left 10/01/2013  . BOILS, RECURRENT 02/12/2008  . ANXIETY 01/30/2006  . CIGARETTE  SMOKER 01/30/2006  . Depression 01/30/2006  . Mild cognitive impairment 01/30/2006  . ALLERGIC RHINITIS 01/30/2006  . SYMPTOM, ENURESIS, NOCTURNAL 01/30/2006    Past Surgical History:  Procedure Laterality Date  . CESAREAN SECTION  2006  . CYSTOSCOPY WITH RETROGRADE PYELOGRAM, URETEROSCOPY AND STENT PLACEMENT Bilateral 10/28/2013   Procedure: CYSTOSCOPY WITH RETROGRADE PYELOGRAM, CYSTOGRAM;  Surgeon: Valetta Fuller, MD;  Location: [facility];  Service: Urology;  Laterality: Bilateral;  . LAPAROSCOPIC UNILATERAL SALPINGO OOPHERECTOMY Left 01/05/2014   Procedure: LAPAROSCOPIC LEFT SALPINGO OOPHORECTOMY;  Surgeon: Catalina Antigua, MD;  Location: [facility];  Service: Gynecology;  Laterality: Left;    OB History    Gravida Para Term Preterm AB Living   2 1 1   1 1    SAB TAB Ectopic Multiple Live Births   1               Home Medications    Prior to Admission medications   Medication Sig Start Date End Date Taking? Authorizing Provider  amitriptyline (ELAVIL) 25 MG tablet Take 1 tablet for 5 days, then twice daily in the AM/PM for 5 days, then increase to three times daily Patient taking differently: Take 25 mg by mouth 3 (three) times daily.  12/25/15  Yes Asiyah Mayra Reel, MD  cephALEXin (KEFLEX) 500 MG capsule Take 1 capsule (500 mg total) by  mouth 4 (four) times daily. 12/25/15  Yes Campbell Stall, MD  docusate sodium (COLACE) 100 MG capsule Take 1 capsule (100 mg total) by mouth 2 (two) times daily. Patient taking differently: Take 100 mg by mouth 2 (two) times daily as needed for mild constipation.  12/08/15  Yes Renne Musca, MD  EDURANT 25 MG TABS tablet TAKE ONE TABLET BY MOUTH EVERY MORNING WITH BREAKFAST 12/13/15  Yes Cliffton Asters, MD  EPIVIR 10 MG/ML solution TAKE 30 MILLILITERS (2 TABLESPOONFULS) BY MOUTH ONCE DAILY 12/13/15  Yes Cliffton Asters, MD  ondansetron (ZOFRAN ODT) 4 MG disintegrating tablet Take 1 tablet (4 mg total) by mouth every 8 (eight) hours as needed for nausea  or vomiting. 11/21/15  Yes Pricilla Loveless, MD  TIVICAY 50 MG tablet TAKE ONE (1) TABLET BY MOUTH EVERY DAY 12/13/15  Yes Cliffton Asters, MD  dapsone 100 MG tablet Take 1 tablet (100 mg total) by mouth daily. Patient not taking: Reported on 01/18/2016 08/03/15   Cliffton Asters, MD  traMADol (ULTRAM) 50 MG tablet Take 1 tablet (50 mg total) by mouth 3 (three) times daily as needed. Patient not taking: Reported on 01/18/2016 12/25/15   Campbell Stall, MD    Family History Family History  Problem Relation Age of Onset  . HIV Mother     Social History Social History  Substance Use Topics  . Smoking status: Current Every Day Smoker    Packs/day: 0.10    Years: 14.00    Types: Cigarettes, Cigars  . Smokeless tobacco: Never Used     Comment: smoking 1-2 black and milds per day  . Alcohol use No     Comment:  - does smoke tobacco product called blackmilds     Allergies   Ibuprofen; Latex; Sulfamethoxazole-trimethoprim; and Tylenol [acetaminophen]   Review of Systems Review of Systems  Constitutional: Negative for chills and fever.  Respiratory: Negative.  Negative for shortness of breath.   Cardiovascular: Negative.  Negative for chest pain.  Gastrointestinal: Positive for abdominal pain.  Musculoskeletal: Negative.   Skin: Negative.   Neurological: Negative.      Physical Exam Updated Vital Signs BP 108/79   Pulse 109   Temp 98.1 F (36.7 C) (Oral)   Resp 20   LMP 12/26/2015 (Approximate)   SpO2 99%   Physical Exam  Constitutional: She is oriented to person, place, and time. She appears well-developed and well-nourished.  HENT:  Head: Normocephalic.  Neck: Normal range of motion. Neck supple.  Cardiovascular: Normal rate and regular rhythm.   Pulmonary/Chest: Effort normal and breath sounds normal. She has no wheezes. She has no rales.  Abdominal: Soft. Bowel sounds are normal. She exhibits no distension. There is tenderness (Diffusely tender). There is no rebound and  no guarding.  Musculoskeletal: Normal range of motion.  Neurological: She is alert and oriented to person, place, and time.  Skin: Skin is warm and dry. No rash noted.  Psychiatric: She has a normal mood and affect.     ED Treatments / Results  Labs (all labs ordered are listed, but only abnormal results are displayed) Labs Reviewed - No data to display  EKG  EKG Interpretation None       Radiology No results found.  Procedures Procedures (including critical care time)  Medications Ordered in ED Medications - No data to display   Initial Impression / Assessment and Plan / ED Course  I have reviewed the triage vital signs and the nursing notes.  Pertinent labs & imaging results that were available during my care of the patient were reviewed by me and considered in my medical decision making (see chart for details).  Clinical Course    The patient presents for evaluation and treatment of recurrent/chronic abdominal pain. She reports diagnosis of IBS, by the emergency department physician. No relief with prescribed medications. She has nausea without vomiting, no fever.  The patient was seen earlier today with lab studies performed without acute abnormality with the exception of positive trichomonas for which she was treated with Flagyl. No repeat lab studies were performed.   The patient's pain was treated with Ultram. She continues to have pain. Discussed with patient and husband that her pain is following a chronic pattern that will likely need outpatient evaluation to solve. Referred to GI.   Final Clinical Impressions(s) / ED Diagnoses   Final diagnoses:  None   1. Chronic Abdominal pain  New Prescriptions New Prescriptions   No medications on file     Elpidio AnisShari Joslyne Marshburn, Cordelia Poche-C 01/19/16 [id number]    Gilda Creasehristopher J Pollina, MD 01/19/16 [phone]

## 2016-01-18 NOTE — ED Notes (Addendum)
Pt called out, crying, for pain medication; will inform provider

## 2016-01-18 NOTE — ED Notes (Signed)
Husband asking to speak with provider concerning pt's pain level. Melvenia BeamShari PA to bedside speaking with family. Husband and pt expressed understanding of discharge instructions with follow up with gastroenterology.

## 2016-01-18 NOTE — ED Triage Notes (Signed)
Pt reports lower abd pain x 2 days. Pt has hx of IBS. Pt was seen here earlier today. Pt tachycardic 121 in triage. Denies fevers and afebrile in triage. Pt states "the dr did not treat me earlier today for what I came in for"

## 2016-01-18 NOTE — ED Notes (Signed)
Pt requesting pain medication provider aware 

## 2016-01-18 NOTE — ED Notes (Signed)
Pt was seen here earlier today and treated for trichomonas. C/o lower abd pain and doesn't feel that this is related to trich. Denies urinary issues or vag discharge.

## 2016-01-18 NOTE — ED Notes (Signed)
Pt given heating packs per request; also asking for discharge paperwork. Pt asking for additional tramadol to take home; informed pt we cannot send her home with narcotics. Pt expressed understanding

## 2016-01-19 LAB — GC/CHLAMYDIA PROBE AMP (~~LOC~~) NOT AT ARMC
CHLAMYDIA, DNA PROBE: NEGATIVE
Neisseria Gonorrhea: NEGATIVE

## 2016-01-22 ENCOUNTER — Emergency Department (HOSPITAL_COMMUNITY)
Admission: EM | Admit: 2016-01-22 | Discharge: 2016-01-22 | Disposition: A | Discharge status: Home / Self Care | Payer: Medicaid Other | Attending: Emergency Medicine | Admitting: Emergency Medicine

## 2016-01-22 ENCOUNTER — Encounter (HOSPITAL_COMMUNITY): Payer: Self-pay | Admitting: Emergency Medicine

## 2016-01-22 DIAGNOSIS — F17210 Nicotine dependence, cigarettes, uncomplicated: Secondary | ICD-10-CM | POA: Insufficient documentation

## 2016-01-22 DIAGNOSIS — N390 Urinary tract infection, site not specified: Secondary | ICD-10-CM | POA: Diagnosis not present

## 2016-01-22 DIAGNOSIS — Z79899 Other long term (current) drug therapy: Secondary | ICD-10-CM | POA: Diagnosis not present

## 2016-01-22 DIAGNOSIS — Z91040 Latex allergy status: Secondary | ICD-10-CM | POA: Insufficient documentation

## 2016-01-22 DIAGNOSIS — R1084 Generalized abdominal pain: Secondary | ICD-10-CM | POA: Insufficient documentation

## 2016-01-22 DIAGNOSIS — R109 Unspecified abdominal pain: Secondary | ICD-10-CM

## 2016-01-22 DIAGNOSIS — R1030 Lower abdominal pain, unspecified: Secondary | ICD-10-CM | POA: Diagnosis present

## 2016-01-22 LAB — LIPASE, BLOOD: Lipase: 26 U/L (ref 11–51)

## 2016-01-22 LAB — URINALYSIS, ROUTINE W REFLEX MICROSCOPIC
Bilirubin Urine: NEGATIVE
GLUCOSE, UA: NEGATIVE mg/dL
KETONES UR: NEGATIVE mg/dL
Nitrite: NEGATIVE
PROTEIN: 30 mg/dL — AB
Specific Gravity, Urine: 1.017 (ref 1.005–1.030)
pH: 6 (ref 5.0–8.0)

## 2016-01-22 LAB — COMPREHENSIVE METABOLIC PANEL
ALK PHOS: 68 U/L (ref 38–126)
ALT: 20 U/L (ref 14–54)
ANION GAP: 7 (ref 5–15)
AST: 33 U/L (ref 15–41)
Albumin: 3.8 g/dL (ref 3.5–5.0)
BILIRUBIN TOTAL: 0.4 mg/dL (ref 0.3–1.2)
BUN: 17 mg/dL (ref 6–20)
CHLORIDE: 104 mmol/L (ref 101–111)
CO2: 23 mmol/L (ref 22–32)
Calcium: 9.2 mg/dL (ref 8.9–10.3)
Creatinine, Ser: 0.6 mg/dL (ref 0.44–1.00)
GFR calc non Af Amer: >60 mL/min (ref >60)
GLUCOSE: 91 mg/dL (ref 65–99)
Potassium: 4 mmol/L (ref 3.5–5.1)
Sodium: 134 mmol/L — ABNORMAL LOW (ref 135–145)
Total Protein: 9.7 g/dL — ABNORMAL HIGH (ref 6.5–8.1)

## 2016-01-22 LAB — CBC
HEMATOCRIT: 36.3 % (ref 36.0–46.0)
HEMOGLOBIN: 12.2 g/dL (ref 12.0–15.0)
MCH: 30.6 pg (ref 26.0–34.0)
MCHC: 33.6 g/dL (ref 30.0–36.0)
MCV: 91 fL (ref 78.0–100.0)
Platelets: 184 10*3/uL (ref 150–400)
RBC: 3.99 MIL/uL (ref 3.87–5.11)
RDW: 13.9 % (ref 11.5–15.5)
WBC: 3.1 10*3/uL — ABNORMAL LOW (ref 4.0–10.5)

## 2016-01-22 LAB — I-STAT BETA HCG BLOOD, ED (MC, WL, AP ONLY)

## 2016-01-22 MED ORDER — OXYCODONE HCL 5 MG PO CAPS: 5.0000 mg | ORAL_CAPSULE | Freq: Four times a day (QID) | ORAL | 0 refills | Status: DC | PRN

## 2016-01-22 MED ORDER — CIPROFLOXACIN HCL 500 MG PO TABS: 500.0000 mg | ORAL_TABLET | Freq: Two times a day (BID) | ORAL | 0 refills | Status: DC

## 2016-01-22 MED ORDER — SODIUM CHLORIDE 0.9 % IV BOLUS (SEPSIS)
1000.0000 mL | Freq: Once | INTRAVENOUS | Status: AC
  Administered 2016-01-22: 1000 mL via INTRAVENOUS

## 2016-01-22 MED ORDER — CIPROFLOXACIN IN D5W 400 MG/200ML IV SOLN
400.0000 mg | Freq: Once | INTRAVENOUS | Status: AC
  Administered 2016-01-22: 400 mg via INTRAVENOUS
  Filled 2016-01-22: qty 200

## 2016-01-22 MED ORDER — HYDROMORPHONE HCL 1 MG/ML IJ SOLN
0.5000 mg | INTRAMUSCULAR | Status: AC | PRN
  Administered 2016-01-22 (×2): 0.5 mg via INTRAVENOUS
  Filled 2016-01-22 (×2): qty 1

## 2016-01-22 MED ORDER — PROMETHAZINE HCL 25 MG/ML IJ SOLN
12.5000 mg | Freq: Once | INTRAMUSCULAR | Status: AC
  Administered 2016-01-22: 12.5 mg via INTRAVENOUS
  Filled 2016-01-22: qty 1

## 2016-01-22 MED ORDER — LORAZEPAM 2 MG/ML IJ SOLN
1.0000 mg | Freq: Once | INTRAMUSCULAR | Status: AC
  Administered 2016-01-22: 1 mg via INTRAVENOUS
  Filled 2016-01-22: qty 1

## 2016-01-22 MED ORDER — ONDANSETRON HCL 4 MG/2ML IJ SOLN
4.0000 mg | Freq: Once | INTRAMUSCULAR | Status: AC
  Administered 2016-01-22: 4 mg via INTRAVENOUS
  Filled 2016-01-22: qty 2

## 2016-01-22 MED ORDER — SODIUM CHLORIDE 0.9 % IV SOLN
INTRAVENOUS | Status: DC
  Administered 2016-01-22: 18:00:00 via INTRAVENOUS

## 2016-01-22 NOTE — ED Provider Notes (Addendum)
[facility]-EMERGENCY DEPT Provider Note   CSN: [id number] Arrival date & time: 01/22/16  1222     History   Chief Complaint Chief Complaint  Patient presents with  . Abdominal Pain  . Back Pain    HPI Kara PresserSharon D Allen is a 36 y.o. female.  HPI The patient presents to the emergency room with complaints of severe lower abdominal pain.  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 She has recurrent bouts of abdominal cramping.  She also has history of recurrent urinary tract infections. Patient states the symptoms a few years ago and wax and wane in severity.  Unfortunately the pain has been severe enough that she's been to the emergency room 6 times in the last 3 months for episodes of abdominal pain. The patient has had nausea but denies any vomiting with this episode. She has been taking her antibiotics. She denies any trouble with diarrhea.  She denies any vaginal bleeding. No dysuria. The pain was very severe today. Her significant other states it was bad enough that it triggered a seizure. She does have history of seizures.  The last time she was in the emergency room she was referred to gastroenterologist. Patient was not able to see them because they want the referral to come for her primary care doctor and she is waiting for that to be arranged.  Past Medical History:  Diagnosis Date  . Cyst of ovary    PT STATES SHE IS TAKING ANTIBIOTICS FOR CYST ON OVARY  . HIV (human immunodeficiency virus infection) (HCC)   . Hydronephrosis, left   . Pyelonephritis   . Recurrent boils   . Trichomonas infection   . Urinary frequency    and nocturia    Patient Active Problem List   Diagnosis Date Noted  . Generalized abdominal pain   . Hyponatremia   . Leukopenia   . Positive Murphy's Sign   . Right upper quadrant abdominal pain   . Pyelonephritis 12/06/2015  . Elevated lactic acid level   . Flank pain   . Lower abdominal pain   . Gum inflammation 06/02/2014  .  Pelvic pain in female   . Left ovarian cyst   . Back pain   . 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 HIV (human immunodeficiency virus infection) (HCC) 10/01/2013  . Hydronephrosis, left 10/01/2013  . BOILS, RECURRENT 02/12/2008  . ANXIETY 01/30/2006  . CIGARETTE SMOKER 01/30/2006  . Depression 01/30/2006  . Mild cognitive impairment 01/30/2006  . ALLERGIC RHINITIS 01/30/2006  . SYMPTOM, ENURESIS, NOCTURNAL 01/30/2006    Past Surgical History:  Procedure Laterality Date  . CESAREAN SECTION  2006  . CYSTOSCOPY WITH RETROGRADE PYELOGRAM, URETEROSCOPY AND STENT PLACEMENT Bilateral 10/28/2013   Procedure: CYSTOSCOPY WITH RETROGRADE PYELOGRAM, CYSTOGRAM;  Surgeon: Valetta Fulleravid S Grapey, MD;  Location: [facility];  Service: Urology;  Laterality: Bilateral;  . LAPAROSCOPIC UNILATERAL SALPINGO OOPHERECTOMY Left 01/05/2014   Procedure: LAPAROSCOPIC LEFT SALPINGO OOPHORECTOMY;  Surgeon: Catalina AntiguaPeggy Constant, MD;  Location: [facility];  Service: Gynecology;  Laterality: Left;    OB History    Gravida Para Term Preterm AB Living   2 1 1   1 1    SAB TAB Ectopic Multiple Live Births   1               Home Medications    Prior to Admission medications   Medication Sig Start Date End Date Taking? Authorizing Provider  amitriptyline (ELAVIL) 25 MG tablet Take 1 tablet for 5 days, then  twice daily in the AM/PM for 5 days, then increase to three times daily Patient taking differently: Take 25 mg by mouth 3 (three) times daily.  12/25/15   Asiyah Mayra Reel, MD  cephALEXin (KEFLEX) 500 MG capsule Take 1 capsule (500 mg total) by mouth 4 (four) times daily. 12/25/15   Campbell Stall, MD  ciprofloxacin (CIPRO) 500 MG tablet Take 1 tablet (500 mg total) by mouth 2 (two) times daily. 01/22/16   Linwood Dibbles, MD  dapsone 100 MG tablet Take 1 tablet (100 mg total) by mouth daily. Patient not taking: Reported on 01/18/2016 08/03/15   Cliffton Asters, MD  docusate sodium (COLACE) 100 MG capsule Take 1 capsule (100 mg total) by mouth  2 (two) times daily. Patient taking differently: Take 100 mg by mouth 2 (two) times daily as needed for mild constipation.  12/08/15   Renne Musca, MD  EDURANT 25 MG TABS tablet TAKE ONE TABLET BY MOUTH EVERY MORNING WITH BREAKFAST 12/13/15   Cliffton Asters, MD  EPIVIR 10 MG/ML solution TAKE 30 MILLILITERS (2 TABLESPOONFULS) BY MOUTH ONCE DAILY 12/13/15   Cliffton Asters, MD  ondansetron (ZOFRAN ODT) 4 MG disintegrating tablet Take 1 tablet (4 mg total) by mouth every 8 (eight) hours as needed for nausea or vomiting. 11/21/15   Pricilla Loveless, MD  oxycodone (OXY-IR) 5 MG capsule Take 1 capsule (5 mg total) by mouth every 6 (six) hours as needed. 01/22/16   Linwood Dibbles, MD  TIVICAY 50 MG tablet TAKE ONE (1) TABLET BY MOUTH EVERY DAY 12/13/15   Cliffton Asters, MD  traMADol (ULTRAM) 50 MG tablet Take 1 tablet (50 mg total) by mouth every 6 (six) hours as needed. 01/18/16   Elpidio Anis, PA-C    Family History Family History  Problem Relation Age of Onset  . HIV Mother     Social History Social History  Substance Use Topics  . Smoking status: Current Every Day Smoker    Packs/day: 0.10    Years: 14.00    Types: Cigarettes, Cigars  . Smokeless tobacco: Never Used     Comment: smoking 1-2 black and milds per day  . Alcohol use No     Comment:  - does smoke tobacco product called blackmilds     Allergies   Ibuprofen; Latex; Sulfamethoxazole-trimethoprim; and Tylenol [acetaminophen]   Review of Systems Review of Systems  All other systems reviewed and are negative.    Physical Exam Updated Vital Signs BP 120/78   Pulse 104   Temp 97.6 F (36.4 C) (Oral)   Resp 17   Ht 5\' 1"  (1.549 m)   Wt 68 kg   LMP 12/26/2015 (Approximate)   SpO2 100%   BMI 28.34 kg/m   Physical Exam  Constitutional: She appears distressed.  HENT:  Head: Normocephalic and atraumatic.  Right Ear: External ear normal.  Left Ear: External ear normal.  Eyes: Conjunctivae are normal. Right eye exhibits no  discharge. Left eye exhibits no discharge. No scleral icterus.  Neck: Neck supple. No tracheal deviation present.  Cardiovascular: Normal rate, regular rhythm and intact distal pulses.   Pulmonary/Chest: Effort normal and breath sounds normal. No stridor. No respiratory distress. She has no wheezes. She has no rales.  Abdominal: Soft. Bowel sounds are normal. She exhibits no distension. There is no hepatosplenomegaly. There is generalized tenderness. There is no rebound and no guarding. No hernia.  Musculoskeletal: She exhibits no edema or tenderness.  Neurological: She is alert. She has normal strength.  No cranial nerve deficit (no facial droop, extraocular movements intact, no slurred speech) or sensory deficit. She exhibits normal muscle tone. She displays no seizure activity. Coordination normal.  Skin: Skin is warm and dry. No rash noted. She is not diaphoretic.  Psychiatric: She has a normal mood and affect.  Nursing note and vitals reviewed.    ED Treatments / Results  Labs (all labs ordered are listed, but only abnormal results are displayed) Labs Reviewed  COMPREHENSIVE METABOLIC PANEL - Abnormal; Notable for the following:       Result Value   Sodium 134 (*)    Total Protein 9.7 (*)    All other components within normal limits  CBC - Abnormal; Notable for the following:    WBC 3.1 (*)    All other components within normal limits  URINALYSIS, ROUTINE W REFLEX MICROSCOPIC - Abnormal; Notable for the following:    APPearance HAZY (*)    Hgb urine dipstick MODERATE (*)    Protein, ur 30 (*)    Leukocytes, UA LARGE (*)    Bacteria, UA RARE (*)    Squamous Epithelial / LPF 0-5 (*)    All other components within normal limits  URINE CULTURE  LIPASE, BLOOD  I-STAT BETA HCG BLOOD, ED ([facility], [facility], [facility] ONLY)     Radiology No results found.  Procedures Procedures (including critical care time)  Medications Ordered in ED Medications  sodium chloride 0.9 % bolus 1,000 mL (0 mLs  Intravenous Stopped 01/22/16 1746)    And  0.9 %  sodium chloride infusion ( Intravenous New Bag/Given 01/22/16 1748)  ciprofloxacin (CIPRO) IVPB 400 mg (400 mg Intravenous New Bag/Given 01/22/16 1816)  HYDROmorphone (DILAUDID) injection 0.5 mg (0.5 mg Intravenous Given 01/22/16 1747)  ondansetron (ZOFRAN) injection 4 mg (4 mg Intravenous Given 01/22/16 1635)  LORazepam (ATIVAN) injection 1 mg (1 mg Intravenous Given 01/22/16 1640)  promethazine (PHENERGAN) injection 12.5 mg (12.5 mg Intravenous Given 01/22/16 1748)     Initial Impression / Assessment and Plan / ED Course  I have reviewed the triage vital signs and the nursing notes.  Pertinent labs & imaging results that were available during my care of the patient were reviewed by me and considered in my medical decision making (see chart for details).  Clinical Course as of Jan 21 1846  Wynelle Link Jan 22, 2016  1525 Previous imaging results reviewed.  3 CT scans in the past year  [JK]  1526 Last month pt had a CT scan of the abdomen, US of the abdomen and a hida scan.  No acute findings noted.  [JK]  1527 On the 1/7 visit had a pelvic exam.  Treated for trich.  Treated for GC and chlamydia  [JK]  1630 Called to the bedside.  Pt is having what appears to be a seizure.  She has not been given any of her medications yet. Will check with nursing regarding the delay.  Ativan 1 mg IV ordered.  Seizure activity stopped during my eval  [JK]    Clinical Course User Index [JK] Linwood Dibbles, MD   Patient did not have any postictal phase after her seizure-like activity in the emergency room. Possible this was a true seizure versus a shaking spell associated with the pain she was experiencing.  Patient's symptoms essentially improved with treatment in the emergency room.  Her laboratory tests are reassuring with the exception of an abnormal urinalysis. I reviewed her prior laboratory tests and she chronically has white blood cells  in her urine. However she does  have large leukocyte esterase today and to numerous to count RBCs. Her last urine culture was inconclusive. The patient is taking Keflex. I will repeat a urine culture and change her to Cipro. I think with her improving symptoms and normal laboratory tests and numerous previous imaging studies that she does not need any further evaluation at this time and can be safely discharged with close outpatient follow-up. The patient and her husband agreed to this plan.  Final Clinical Impressions(s) / ED Diagnoses   Final diagnoses:  Lower urinary tract infectious disease  Abdominal pain, unspecified abdominal location    New Prescriptions New Prescriptions   CIPROFLOXACIN (CIPRO) 500 MG TABLET    Take 1 tablet (500 mg total) by mouth 2 (two) times daily.   OXYCODONE (OXY-IR) 5 MG CAPSULE    Take 1 capsule (5 mg total) by mouth every 6 (six) hours as needed.     Linwood Dibbles, MD 01/22/16 1849   Pt was able to eat and drink.  She went to go to the bathroom and had an episode of vomiting.  Will give dose of phenergan.   Linwood Dibbles, MD 01/22/16 1919

## 2016-01-22 NOTE — Discharge Instructions (Signed)
Follow-up with your primary care doctor, take the antibiotics as prescribed, monitor for fever or worsening symptoms

## 2016-01-22 NOTE — ED Triage Notes (Addendum)
patient c/o abd pain and back pain. Patient referred to GI specialist but patient reports that her PCP has to refer her instead of ED. Patient is working on paperwork to get into GI.  Patient is currently trying one medications. Patient pain has been going on for 2-3 years.  Patient states that she is taking medications for nausea and denies diarrhea, constipation or urinary problems.

## 2016-01-22 NOTE — ED Notes (Signed)
Patient was able to eat and drink without vomiting.

## 2016-01-22 NOTE — ED Notes (Signed)
EDP ok'd patient to eat. Patient going to try soup and water. Will notifiy staff if unable to keep down.

## 2016-01-23 LAB — URINE CULTURE: Culture: NO GROWTH

## 2016-02-08 ENCOUNTER — Encounter (HOSPITAL_COMMUNITY): Payer: Self-pay | Admitting: Emergency Medicine

## 2016-02-08 ENCOUNTER — Emergency Department (HOSPITAL_COMMUNITY)
Admission: EM | Admit: 2016-02-08 | Discharge: 2016-02-08 | Disposition: A | Discharge status: Home / Self Care | Payer: Medicaid Other | Attending: Emergency Medicine | Admitting: Emergency Medicine

## 2016-02-08 DIAGNOSIS — Z91040 Latex allergy status: Secondary | ICD-10-CM | POA: Insufficient documentation

## 2016-02-08 DIAGNOSIS — F17210 Nicotine dependence, cigarettes, uncomplicated: Secondary | ICD-10-CM | POA: Diagnosis not present

## 2016-02-08 DIAGNOSIS — Z79899 Other long term (current) drug therapy: Secondary | ICD-10-CM | POA: Insufficient documentation

## 2016-02-08 DIAGNOSIS — N10 Acute tubulo-interstitial nephritis: Secondary | ICD-10-CM | POA: Insufficient documentation

## 2016-02-08 DIAGNOSIS — R1033 Periumbilical pain: Secondary | ICD-10-CM | POA: Diagnosis present

## 2016-02-08 DIAGNOSIS — R109 Unspecified abdominal pain: Secondary | ICD-10-CM

## 2016-02-08 LAB — URINALYSIS, ROUTINE W REFLEX MICROSCOPIC
Bilirubin Urine: NEGATIVE
Glucose, UA: NEGATIVE mg/dL
KETONES UR: NEGATIVE mg/dL
Nitrite: NEGATIVE
PROTEIN: 100 mg/dL — AB
Specific Gravity, Urine: 1.016 (ref 1.005–1.030)
pH: 7 (ref 5.0–8.0)

## 2016-02-08 LAB — COMPREHENSIVE METABOLIC PANEL
ALBUMIN: 3.7 g/dL (ref 3.5–5.0)
ALK PHOS: 57 U/L (ref 38–126)
ALT: 23 U/L (ref 14–54)
AST: 40 U/L (ref 15–41)
Anion gap: 8 (ref 5–15)
BUN: 11 mg/dL (ref 6–20)
CALCIUM: 9.1 mg/dL (ref 8.9–10.3)
CO2: 23 mmol/L (ref 22–32)
CREATININE: 0.66 mg/dL (ref 0.44–1.00)
Chloride: 107 mmol/L (ref 101–111)
GFR calc Af Amer: >60 mL/min (ref >60)
GFR calc non Af Amer: >60 mL/min (ref >60)
GLUCOSE: 94 mg/dL (ref 65–99)
Potassium: 3.5 mmol/L (ref 3.5–5.1)
SODIUM: 138 mmol/L (ref 135–145)
Total Bilirubin: 0.4 mg/dL (ref 0.3–1.2)
Total Protein: 9 g/dL — ABNORMAL HIGH (ref 6.5–8.1)

## 2016-02-08 LAB — LIPASE, BLOOD: Lipase: 23 U/L (ref 11–51)

## 2016-02-08 LAB — CBC WITH DIFFERENTIAL/PLATELET
BASOS ABS: 0 10*3/uL (ref 0.0–0.1)
BASOS PCT: 0 %
EOS ABS: 0 10*3/uL (ref 0.0–0.7)
Eosinophils Relative: 1 %
HEMATOCRIT: 33.1 % — AB (ref 36.0–46.0)
Hemoglobin: 11.2 g/dL — ABNORMAL LOW (ref 12.0–15.0)
Lymphocytes Relative: 33 %
Lymphs Abs: 0.9 10*3/uL (ref 0.7–4.0)
MCH: 30.6 pg (ref 26.0–34.0)
MCHC: 33.8 g/dL (ref 30.0–36.0)
MCV: 90.4 fL (ref 78.0–100.0)
MONO ABS: 0.2 10*3/uL (ref 0.1–1.0)
Monocytes Relative: 8 %
NEUTROS ABS: 1.6 10*3/uL — AB (ref 1.7–7.7)
NEUTROS PCT: 58 %
Platelets: 148 10*3/uL — ABNORMAL LOW (ref 150–400)
RBC: 3.66 MIL/uL — ABNORMAL LOW (ref 3.87–5.11)
RDW: 13.7 % (ref 11.5–15.5)
WBC: 2.7 10*3/uL — ABNORMAL LOW (ref 4.0–10.5)

## 2016-02-08 LAB — I-STAT BETA HCG BLOOD, ED (MC, WL, AP ONLY): I-stat hCG, quantitative: <5 m[IU]/mL (ref <5)

## 2016-02-08 MED ORDER — DICYCLOMINE HCL 10 MG/ML IM SOLN
20.0000 mg | Freq: Once | INTRAMUSCULAR | Status: AC
  Administered 2016-02-08: 20 mg via INTRAMUSCULAR
  Filled 2016-02-08: qty 2

## 2016-02-08 MED ORDER — DICYCLOMINE HCL 20 MG PO TABS: 20.0000 mg | ORAL_TABLET | Freq: Two times a day (BID) | ORAL | 0 refills | Status: DC

## 2016-02-08 MED ORDER — CEPHALEXIN 500 MG PO CAPS: 500.0000 mg | ORAL_CAPSULE | Freq: Four times a day (QID) | ORAL | 0 refills | Status: AC

## 2016-02-08 NOTE — ED Notes (Signed)
ED Provider at bedside. EDP PEDRO 

## 2016-02-08 NOTE — ED Provider Notes (Signed)
[facility]-EMER[facility]ENCY DEPT Provider Note   CSN: [id number] Arrival date & time: 02/08/16  0803     History   Chief Complaint Chief Complaint  Patient presents with  . Abdominal Pain    HPI Kara Allen is a 36 y.o. female.  The history is provided by the patient.  Abdominal Pain   This is a chronic problem. Episode onset: several years. Episode frequency: intermittent. The problem has not changed since onset.The pain is associated with an unknown (told she has IBS) factor. The pain is located in the periumbilical region and suprapubic region. The quality of the pain is cramping. The pain is moderate. Associated symptoms include nausea. Pertinent negatives include fever, diarrhea, hematochezia, melena and vomiting. Nothing aggravates the symptoms. Nothing relieves the symptoms. Past workup includes CT scan (multiple).    Past Medical History:  Diagnosis Date  . Cyst of ovary    PT STATES SHE IS TAKING ANTIBIOTICS FOR CYST ON OVARY  . HIV (human immunodeficiency virus infection) (HCC)   . Hydronephrosis, left   . Pyelonephritis   . Recurrent boils   . Trichomonas infection   . Urinary frequency    and nocturia    Patient Active Problem List   Diagnosis Date Noted  . Generalized abdominal pain   . Hyponatremia   . Leukopenia   . Positive Murphy's Sign   . Right upper quadrant abdominal pain   . Pyelonephritis 12/06/2015  . Elevated lactic acid level   . Flank pain   . Lower abdominal pain   . Gum inflammation 06/02/2014  . Pelvic pain in female   . Left ovarian cyst   . Back pain   . 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 HIV (human immunodeficiency virus infection) (HCC) 10/01/2013  . Hydronephrosis, left 10/01/2013  . BOILS, RECURRENT 02/12/2008  . ANXIETY 01/30/2006  . CIGARETTE SMOKER 01/30/2006  . Depression 01/30/2006  . Mild cognitive impairment 01/30/2006  . ALLERGIC RHINITIS 01/30/2006  . SYMPTOM, ENURESIS, NOCTURNAL 01/30/2006    Past Surgical History:   Procedure Laterality Date  . CESAREAN SECTION  2006  . CYSTOSCOPY WITH RETROGRADE PYELOGRAM, URETEROSCOPY AND STENT PLACEMENT Bilateral 10/28/2013   Procedure: CYSTOSCOPY WITH RETROGRADE PYELOGRAM, CYSTOGRAM;  Surgeon: Valetta Fuller, MD;  Location: [facility];  Service: Urology;  Laterality: Bilateral;  . LAPAROSCOPIC UNILATERAL SALPINGO OOPHERECTOMY Left 01/05/2014   Procedure: LAPAROSCOPIC LEFT SALPINGO OOPHORECTOMY;  Surgeon: Catalina Antigua, MD;  Location: [facility];  Service: Gynecology;  Laterality: Left;    OB History    Gravida Para Term Preterm AB Living   2 1 1   1 1    SAB TAB Ectopic Multiple Live Births   1               Home Medications    Prior to Admission medications   Medication Sig Start Date End Date Taking? Authorizing Provider  EDURANT 25 MG TABS tablet TAKE ONE TABLET BY MOUTH EVERY MORNING WITH BREAKFAST Patient taking differently: TAKE 25mg  TABLET BY MOUTH EVERY MORNING WITH BREAKFAST 12/13/15  Yes Cliffton Asters, MD  EPIVIR 10 MG/ML solution TAKE 30 MILLILITERS (2 TABLESPOONFULS) BY MOUTH ONCE DAILY 12/13/15  Yes Cliffton Asters, MD  TIVICAY 50 MG tablet TAKE ONE (1) TABLET BY MOUTH EVERY DAY 12/13/15  Yes Cliffton Asters, MD  amitriptyline (ELAVIL) 25 MG tablet Take 1 tablet for 5 days, then twice daily in the AM/PM for 5 days, then increase to three times daily Patient not taking: Reported on 02/08/2016 12/25/15  Asiyah Mayra ReelZahra Mikell, MD  cephALEXin (KEFLEX) 500 MG capsule Take 1 capsule (500 mg total) by mouth 4 (four) times daily. 02/08/16 02/15/16  Nira ConnPedro Eduardo Cardama, MD  ciprofloxacin (CIPRO) 500 MG tablet Take 1 tablet (500 mg total) by mouth 2 (two) times daily. Patient not taking: Reported on 02/08/2016 01/22/16   Linwood DibblesJon Knapp, MD  dapsone 100 MG tablet Take 1 tablet (100 mg total) by mouth daily. Patient not taking: Reported on 02/08/2016 08/03/15   Cliffton AstersJohn Campbell, MD  dicyclomine (BENTYL) 20 MG tablet Take 1 tablet (20 mg total) by mouth 2 (two) times daily. 02/08/16 02/23/16   Nira ConnPedro Eduardo Cardama, MD  docusate sodium (COLACE) 100 MG capsule Take 1 capsule (100 mg total) by mouth 2 (two) times daily. Patient not taking: Reported on 02/08/2016 12/08/15   Renne Muscaaniel L Warden, MD  ondansetron (ZOFRAN ODT) 4 MG disintegrating tablet Take 1 tablet (4 mg total) by mouth every 8 (eight) hours as needed for nausea or vomiting. Patient not taking: Reported on 02/08/2016 11/21/15   Pricilla LovelessScott Goldston, MD  oxycodone (OXY-IR) 5 MG capsule Take 1 capsule (5 mg total) by mouth every 6 (six) hours as needed. Patient not taking: Reported on 02/08/2016 01/22/16   Linwood DibblesJon Knapp, MD  traMADol (ULTRAM) 50 MG tablet Take 1 tablet (50 mg total) by mouth every 6 (six) hours as needed. Patient not taking: Reported on 02/08/2016 01/18/16   Elpidio AnisShari Upstill, PA-C    Family History Family History  Problem Relation Age of Onset  . HIV Mother     Social History Social History  Substance Use Topics  . Smoking status: Current Every Day Smoker    Packs/day: 0.10    Years: 14.00    Types: Cigarettes, Cigars  . Smokeless tobacco: Never Used     Comment: smoking 1-2 black and milds per day  . Alcohol use No     Comment:  - does smoke tobacco product called blackmilds     Allergies   Ibuprofen; Latex; Sulfamethoxazole-trimethoprim; and Tylenol [acetaminophen]   Review of Systems Review of Systems  Constitutional: Negative for fever.  Gastrointestinal: Positive for abdominal pain and nausea. Negative for diarrhea, hematochezia, melena and vomiting.  Ten systems are reviewed and are negative for acute change except as noted in the HPI    Physical Exam Updated Vital Signs BP 125/84   Pulse 78   Temp 98.2 F (36.8 C) (Oral)   Resp 18   LMP 01/24/2016 (Approximate)   SpO2 99%   Physical Exam  Constitutional: She is oriented to person, place, and time. She appears well-developed and well-nourished. No distress.  HENT:  Head: Normocephalic and atraumatic.  Nose: Nose normal.  Eyes: Conjunctivae and  EOM are normal. Pupils are equal, round, and reactive to light. Right eye exhibits no discharge. Left eye exhibits no discharge. No scleral icterus.  Neck: Normal range of motion. Neck supple.  Cardiovascular: Normal rate and regular rhythm.  Exam reveals no gallop and no friction rub.   No murmur heard. Pulmonary/Chest: Effort normal and breath sounds normal. No stridor. No respiratory distress. She has no rales.  Abdominal: Soft. She exhibits no distension. There is generalized tenderness. There is CVA tenderness. There is no rigidity, no rebound and no guarding.  Musculoskeletal: She exhibits no edema or tenderness.  Neurological: She is alert and oriented to person, place, and time.  Skin: Skin is warm and dry. No rash noted. She is not diaphoretic. No erythema.  Psychiatric: She has a normal mood  and affect.  Vitals reviewed.    ED Treatments / Results  Labs (all labs ordered are listed, but only abnormal results are displayed) Labs Reviewed  CBC WITH DIFFERENTIAL/PLATELET - Abnormal; Notable for the following:       Result Value   WBC 2.7 (*)    RBC 3.66 (*)    Hemoglobin 11.2 (*)    HCT 33.1 (*)    Platelets 148 (*)    Neutro Abs 1.6 (*)    All other components within normal limits  COMPREHENSIVE METABOLIC PANEL - Abnormal; Notable for the following:    Total Protein 9.0 (*)    All other components within normal limits  URINALYSIS, ROUTINE W REFLEX MICROSCOPIC - Abnormal; Notable for the following:    APPearance CLOUDY (*)    Hgb urine dipstick MODERATE (*)    Protein, ur 100 (*)    Leukocytes, UA LARGE (*)    Bacteria, UA MANY (*)    Squamous Epithelial / LPF 0-5 (*)    All other components within normal limits  LIPASE, BLOOD  I-STAT BETA HCG BLOOD, ED ([facility], [facility], [facility] ONLY)    EKG  EKG Interpretation None       Radiology No results found.  Procedures Procedures (including critical care time)  Medications Ordered in ED Medications  dicyclomine (BENTYL)  injection 20 mg (20 mg Intramuscular Given 02/08/16 0956)     Initial Impression / Assessment and Plan / ED Course  I have reviewed the triage vital signs and the nursing notes.  Pertinent labs & imaging results that were available during my care of the patient were reviewed by me and considered in my medical decision making (see chart for details).     Pain is chronic in nature reoccurring. Labs grossly reassuring however a UA with likely infection. Significant symptoms improvement after Bentyl. Patient able to tolerate by mouth in the emergency department. We'll provide patient with prescription for Bentyl and instructed to follow-up with her primary care provider and GI as scheduled. Also provide patient with Keflex for likely urinary tract infection.  The patient is safe for discharge with strict return precautions.   Final Clinical Impressions(s) / ED Diagnoses   Final diagnoses:  Abdominal cramping  Acute pyelonephritis   Disposition: Discharge  Condition: Good  I have discussed the results, Dx and Tx plan with the patient who expressed understanding and agree(s) with the plan. Discharge instructions discussed at great length. The patient was given strict return precautions who verbalized understanding of the instructions. No further questions at time of discharge.    Discharge Medication List as of 02/08/2016 12:54 PM      Follow Up: primary care provider   as scheduled for referral to GI for possible IBS and Gyn for possible endometriosis      Nira Conn, MD 02/08/16 1725

## 2016-02-08 NOTE — ED Notes (Signed)
PT INFORMED COULD NOT HAVE PERSONAL HEATING PAD PLUGGED INTO WALL/ POTENTIAL FIRE HAZARD. OFFERED WARM BLANKETS AND HOT PACKS. PT GLADLY ACCEPTED BLANKETS HOWEVER REFUSED HOT PACKS, PERSONAL ITEMS PLACED IN BELONGING BAGS. CHARGE/LEADERSHIP ALAINA RN AWARE OF DISCUSSION. NO ADDITIONAL FEEDBACK GIVEN.

## 2016-02-08 NOTE — ED Notes (Signed)
OBSERVED PT WITH HEATING PAD PLUGGED IN. REMINDED NOT TO KEEP PLUGGED IN AS IT IS A FIRE HAZARD. PT REQUESTED HOT PACK.

## 2016-02-08 NOTE — ED Triage Notes (Addendum)
Pt c/o lower abdominal pain and lower back pain that has recurrent for past 3 years; pt is waiting to see GI but is unable to withstand pain in the mean time; pt's appointment is a week from today

## 2016-02-08 NOTE — ED Notes (Signed)
HOT PACK GIVEN

## 2016-02-15 ENCOUNTER — Encounter: Payer: Self-pay | Admitting: Family Medicine

## 2016-02-15 ENCOUNTER — Ambulatory Visit (INDEPENDENT_AMBULATORY_CARE_PROVIDER_SITE_OTHER): Payer: Medicaid Other | Admitting: Family Medicine

## 2016-02-15 VITALS — BP 108/82 | HR 97 | Temp 98.0°F | Ht 61.0 in | Wt 169.8 lb

## 2016-02-15 DIAGNOSIS — F17200 Nicotine dependence, unspecified, uncomplicated: Secondary | ICD-10-CM | POA: Diagnosis not present

## 2016-02-15 DIAGNOSIS — R109 Unspecified abdominal pain: Secondary | ICD-10-CM

## 2016-02-15 DIAGNOSIS — B20 Human immunodeficiency virus [HIV] disease: Secondary | ICD-10-CM | POA: Diagnosis not present

## 2016-02-15 DIAGNOSIS — Z7689 Persons encountering health services in other specified circumstances: Secondary | ICD-10-CM | POA: Diagnosis not present

## 2016-02-15 DIAGNOSIS — R1084 Generalized abdominal pain: Secondary | ICD-10-CM | POA: Diagnosis not present

## 2016-02-15 MED ORDER — ONDANSETRON 4 MG PO TBDP
4.0000 mg | ORAL_TABLET | Freq: Once | ORAL | Status: AC
  Administered 2016-02-15: 4 mg via ORAL

## 2016-02-15 NOTE — Progress Notes (Signed)
HPI:  Patient presents today for a new patient appointment to establish general primary care, also to discuss :  Abdominal Pain: Patient states that she frequently has constant sharp abdominal pain which is sometimes associated nausea. She has been told before that she has IBS. She states that she is very sensitive to many different foods. She notes that when the pain comes on she can't function. She is in and out the ED for this complaint. She notes that calls the EMS constantly. She notes that started Bentyl last week and she notes a tiny bit of improvement in symptoms. Her pain is worse when she has her menstrual period.  Admits to some melanous stools. Does not know if colon cancer runs in her  Denies any fever, vomiting, diarrhea, hematochezia.   HIV: See an ID doctor, Dr. Orvan Falconerampbell, she notes she last saw them about 6 months ago. hasn't been taking medication for the last month. She needs to make an appointment soon.   ROS: See HPI  Past Medical Hx:  Past Medical History:  Diagnosis Date  . Cyst of ovary    PT STATES SHE IS TAKING ANTIBIOTICS FOR CYST ON OVARY  . HIV (human immunodeficiency virus infection) (HCC)   . Hydronephrosis, left   . Pyelonephritis   . Recurrent boils   . Trichomonas infection   . Urinary frequency    and nocturia   Past Surgical Hx:  Past Surgical History:  Procedure Laterality Date  . CESAREAN SECTION  2006  . CYSTOSCOPY WITH RETROGRADE PYELOGRAM, URETEROSCOPY AND STENT PLACEMENT Bilateral 10/28/2013   Procedure: CYSTOSCOPY WITH RETROGRADE PYELOGRAM, CYSTOGRAM;  Surgeon: Valetta Fulleravid S Grapey, MD;  Location: [facility];  Service: Urology;  Laterality: Bilateral;  . LAPAROSCOPIC UNILATERAL SALPINGO OOPHERECTOMY Left 01/05/2014   Procedure: LAPAROSCOPIC LEFT SALPINGO OOPHORECTOMY;  Surgeon: Catalina AntiguaPeggy Constant, MD;  Location: [facility];  Service: Gynecology;  Laterality: Left;   Family Hx: updated in Epic  Social Hx: [place] in [place]  Health Maintenance:    Adult vaccines due  Topic Date Due  . TETANUS/TDAP  06/05/2001   Medications  Current Outpatient Prescriptions:  .  amitriptyline (ELAVIL) 25 MG tablet, Take 1 tablet for 5 days, then twice daily in the AM/PM for 5 days, then increase to three times daily, Disp: 60 tablet, Rfl: 1 .  dapsone 100 MG tablet, Take 1 tablet (100 mg total) by mouth daily., Disp: 30 tablet, Rfl: 11 .  dicyclomine (BENTYL) 20 MG tablet, Take 1 tablet (20 mg total) by mouth 2 (two) times daily., Disp: 30 tablet, Rfl: 0 .  EDURANT 25 MG TABS tablet, TAKE ONE TABLET BY MOUTH EVERY MORNING WITH BREAKFAST (Patient not taking: Reported on 02/15/2016), Disp: 30 tablet, Rfl: 2 .  EPIVIR 10 MG/ML solution, TAKE 30 MILLILITERS (2 TABLESPOONFULS) BY MOUTH ONCE DAILY (Patient not taking: Reported on 02/15/2016), Disp: 960 mL, Rfl: 2 .  ondansetron (ZOFRAN ODT) 4 MG disintegrating tablet, Take 1 tablet (4 mg total) by mouth every 8 (eight) hours as needed for nausea or vomiting. (Patient not taking: Reported on 02/08/2016), Disp: 10 tablet, Rfl: 0 .  oxycodone (OXY-IR) 5 MG capsule, Take 1 capsule (5 mg total) by mouth every 6 (six) hours as needed. (Patient not taking: Reported on 02/08/2016), Disp: 12 capsule, Rfl: 0 .  TIVICAY 50 MG tablet, TAKE ONE (1) TABLET BY MOUTH EVERY DAY (Patient not taking: Reported on 02/15/2016), Disp: 30 tablet, Rfl: 2 .  traMADol (ULTRAM) 50 MG tablet, Take 1 tablet (  50 mg total) by mouth every 6 (six) hours as needed. (Patient not taking: Reported on 02/08/2016), Disp: 5 tablet, Rfl: 0   PHYSICAL EXAM: BP 108/82   Pulse 97   Temp 98 F (36.7 C) (Oral)   Ht 5\' 1"  (1.549 m)   Wt 169 lb 12.8 oz (77 kg)   LMP 01/24/2016 (Approximate)   SpO2 96%   BMI 32.08 kg/m  General: In NAD, well developed, well nourished Cardiovascular: RRR, normal s1 and s2, no rubs, gallops, or murmurs Respiratory: no increased work of breathing, clear to auscultation bilaterally Abdomen: hyperactive bowel sounds, tender to  light palpation throughout Extremities: no edema MSK: normal ROM  Neuro: A&O x3, no gross motor defecits  Psych: appropriate mood and affect    ASSESSMENT/PLAN:  Health maintenance:  -Up to date besides TDap which patient prefers to hold off on at this time as she is nauseated secondary to abdominal pain and vomiting during exam   CIGARETTE SMOKER Counseling provided  Generalized abdominal pain Unclear etiology but seems likely to be IBS vs IBD. Pain associated with nausea and vomiting. No marijuana use. No red flag symptoms today. Has been to ED numerous times with negative work up, most recently on 02/08/16. Improved symptoms with Bentyl.  - Refer to GI - Continue Bentyl and Zofran - Smoking cessation counseling provided  HIV (human immunodeficiency virus infection) (HCC) HIV present since childhood and obtained from her HIV positive biological mother per patient report. She is adopted and does not know her mother. Concerning as she has not been adhering to her antiviral medications. Last CBC on 2/7 showing WBC to 2.7. She does have an ID doctor who she hasn't seen recently - Strongly advised patient call ID doctor ASAP to schedule an appointment    Anders Simmonds, MD [facility] PGY-2

## 2016-02-15 NOTE — Patient Instructions (Addendum)
Thank you for coming in today, it was so nice to see you! Today we talked about:    Establish care: your health information is up to date in our systme  I have placed a referral to gastroenterology. Someone will call you to schedule this  Please call your infectious disease doctor for an appointment ASAP. It is dangerous not to be on your HIV medications as you can get AIDS  If we ordered any tests today, you will be notified via telephone of any abnormalities. If everything is normal you will get a letter in the mail.   If you have any questions or concerns, please do not hesitate to call the office at [phone]. You can also message me directly via MyChart.   Sincerely,  Anders Simmondshristina Keaghan Staton, MD

## 2016-02-16 ENCOUNTER — Encounter: Payer: Self-pay | Admitting: Gastroenterology

## 2016-02-18 NOTE — Assessment & Plan Note (Addendum)
Unclear etiology but seems likely to be IBS vs IBD. Pain associated with nausea and vomiting. No marijuana use. No red flag symptoms today. Has been to ED numerous times with negative work up, most recently on 02/08/16. Improved symptoms with Bentyl.  - Refer to GI - Continue Bentyl and Zofran - Smoking cessation counseling provided

## 2016-02-18 NOTE — Assessment & Plan Note (Signed)
HIV present since childhood and obtained from her HIV positive biological mother per patient report. She is adopted and does not know her mother. Concerning as she has not been adhering to her antiviral medications. Last CBC on 2/7 showing WBC to 2.7. She does have an ID doctor who she hasn't seen recently - Strongly advised patient call ID doctor ASAP to schedule an appointment

## 2016-02-18 NOTE — Assessment & Plan Note (Signed)
Counseling provided

## 2016-02-21 ENCOUNTER — Telehealth: Payer: Self-pay | Admitting: Family Medicine

## 2016-02-21 ENCOUNTER — Other Ambulatory Visit: Payer: Self-pay | Admitting: Family Medicine

## 2016-02-21 MED ORDER — DICYCLOMINE HCL 20 MG PO TABS: 20.0000 mg | ORAL_TABLET | Freq: Two times a day (BID) | ORAL | 0 refills | Status: DC

## 2016-02-21 NOTE — Telephone Encounter (Signed)
Refilled Bentyl 

## 2016-02-21 NOTE — Telephone Encounter (Signed)
Pt needs a refill on dicyclomine 20 mg, pt uses [facility] on [address]. ep

## 2016-03-19 ENCOUNTER — Other Ambulatory Visit: Payer: Medicaid Other

## 2016-03-19 DIAGNOSIS — B20 Human immunodeficiency virus [HIV] disease: Secondary | ICD-10-CM

## 2016-03-19 LAB — COMPREHENSIVE METABOLIC PANEL
ALBUMIN: 3.7 g/dL (ref 3.6–5.1)
ALK PHOS: 67 U/L (ref 33–115)
ALT: 9 U/L (ref 6–29)
AST: 17 U/L (ref 10–30)
BUN: 9 mg/dL (ref 7–25)
CHLORIDE: 104 mmol/L (ref 98–110)
CO2: 23 mmol/L (ref 20–31)
CREATININE: 0.6 mg/dL (ref 0.50–1.10)
Calcium: 9.4 mg/dL (ref 8.6–10.2)
Glucose, Bld: 86 mg/dL (ref 65–99)
POTASSIUM: 3.9 mmol/L (ref 3.5–5.3)
SODIUM: 135 mmol/L (ref 135–146)
TOTAL PROTEIN: 8.7 g/dL — AB (ref 6.1–8.1)
Total Bilirubin: 0.3 mg/dL (ref 0.2–1.2)

## 2016-03-19 LAB — CBC
HCT: 36.8 % (ref 35.0–45.0)
HEMOGLOBIN: 12.2 g/dL (ref 11.7–15.5)
MCH: 31.3 pg (ref 27.0–33.0)
MCHC: 33.2 g/dL (ref 32.0–36.0)
MCV: 94.4 fL (ref 80.0–100.0)
MPV: 10.8 fL (ref 7.5–12.5)
Platelets: 195 10*3/uL (ref 140–400)
RBC: 3.9 MIL/uL (ref 3.80–5.10)
RDW: 14.7 % (ref 11.0–15.0)
WBC: 2.3 10*3/uL — ABNORMAL LOW (ref 3.8–10.8)

## 2016-03-20 LAB — T-HELPER CELL (CD4) - (RCID CLINIC ONLY)
CD4 % Helper T Cell: 13 % — ABNORMAL LOW (ref 33–55)
CD4 T Cell Abs: 120 /uL — ABNORMAL LOW (ref 400–2700)

## 2016-03-21 ENCOUNTER — Encounter: Payer: Self-pay | Admitting: Gastroenterology

## 2016-03-21 ENCOUNTER — Ambulatory Visit (INDEPENDENT_AMBULATORY_CARE_PROVIDER_SITE_OTHER): Payer: Medicaid Other | Admitting: Gastroenterology

## 2016-03-21 VITALS — BP 100/76 | HR 80 | Ht 61.0 in | Wt 171.8 lb

## 2016-03-21 DIAGNOSIS — R1030 Lower abdominal pain, unspecified: Secondary | ICD-10-CM

## 2016-03-21 LAB — HIV-1 RNA QUANT-NO REFLEX-BLD
HIV 1 RNA Quant: 28 copies/mL — ABNORMAL HIGH
HIV-1 RNA Quant, Log: 1.45 Log copies/mL — ABNORMAL HIGH

## 2016-03-21 MED ORDER — NA SULFATE-K SULFATE-MG SULF 17.5-3.13-1.6 GM/177ML PO SOLN: 1.0000 | Freq: Once | ORAL | 0 refills | Status: AC

## 2016-03-21 NOTE — Patient Instructions (Signed)
You will be set up for an upper endoscopy for post prandial pains. You will be set up for a colonoscopy for lower abdominal pains.

## 2016-03-21 NOTE — Progress Notes (Signed)
HPI: This is a  pleasant 36 year old woman  who was referred to me by Beaulah Dinning, MD  to evaluate  chronic abdominal pain .    Chief complaint is chronic abdominal pain  Old Data Reviewed:   since September, 2015 she has had 5 CT scans of her abdomen and pelvis with IV contrast, she has had an abdominal ultrasound of her right upper quadrant, she has had an acute HIDA scan. These were done for a variety of abdominal discomforts. She has also had pelvic ultrasounds numerous times. None of these imaging tests have explained her abdominal discomforts thoroughly. 12/2014 US IMPRESSION:Small gallstones versus tumefactive sludge.  She's had pains for many years.  Feels like being kicking in lower abdomen.  If cheese is warm or if she eats salad she has pains.  PAin is constant, then worse after she eats.  She does have pyrosis, rarely.  She takes 40mg  dicyclomine and it sometimes helps.  She takes motrin; started 2 months.  She takes 4 pills twice daily.  OTC.    She will get light headed and dizzy.  Moving her bowels helps only briefly.  Passing gas will help, but only briefly.  Has BM daily, but feels like it isn't enough.    Review of systems: Pertinent positive and negative review of systems were noted in the above HPI section. Complete review of systems was performed and was otherwise normal.   Past Medical History:  Diagnosis Date  . Cyst of ovary    PT STATES SHE IS TAKING ANTIBIOTICS FOR CYST ON OVARY  . HIV (human immunodeficiency virus infection) (HCC)   . Hydronephrosis, left   . Pyelonephritis   . Recurrent boils   . Trichomonas infection   . Urinary frequency    and nocturia    Past Surgical History:  Procedure Laterality Date  . CESAREAN SECTION  2006  . CYSTOSCOPY WITH RETROGRADE PYELOGRAM, URETEROSCOPY AND STENT PLACEMENT Bilateral 10/28/2013   Procedure: CYSTOSCOPY WITH RETROGRADE PYELOGRAM, CYSTOGRAM;  Surgeon: Valetta Fuller, MD;  Location: [facility];  Service: Urology;  Laterality: Bilateral;  . LAPAROSCOPIC UNILATERAL SALPINGO OOPHERECTOMY Left 01/05/2014   Procedure: LAPAROSCOPIC LEFT SALPINGO OOPHORECTOMY;  Surgeon: Catalina Antigua, MD;  Location: [facility];  Service: Gynecology;  Laterality: Left;    Current Outpatient Prescriptions  Medication Sig Dispense Refill  . dicyclomine (BENTYL) 20 MG tablet Take 1 tablet (20 mg total) by mouth 2 (two) times daily. 60 tablet 0  . EDURANT 25 MG TABS tablet TAKE ONE TABLET BY MOUTH EVERY MORNING WITH BREAKFAST 30 tablet 2  . EPIVIR 10 MG/ML solution TAKE 30 MILLILITERS (2 TABLESPOONFULS) BY MOUTH ONCE DAILY 960 mL 2  . ondansetron (ZOFRAN ODT) 4 MG disintegrating tablet Take 1 tablet (4 mg total) by mouth every 8 (eight) hours as needed for nausea or vomiting. 10 tablet 0  . TIVICAY 50 MG tablet TAKE ONE (1) TABLET BY MOUTH EVERY DAY 30 tablet 2   No current facility-administered medications for this visit.     Allergies as of 03/21/2016 - Review Complete 03/21/2016  Allergen Reaction Noted  . Ibuprofen Rash and Other (See Comments) 10/28/2013  . Latex Other (See Comments) 02/12/2008  . Sulfamethoxazole-trimethoprim Other (See Comments) 02/12/2008  . Tylenol [acetaminophen] Other (See Comments) 09/30/2013    Family History  Problem Relation Age of Onset  . HIV Mother     Social History   Social History  . Marital status: Single    Spouse name:  N/A  . Number of children: 1  . Years of education: N/A   Occupational History  . Not on file.   Social History Main Topics  . Smoking status: Current Every Day Smoker    Packs/day: 0.10    Years: 14.00    Types: Cigarettes, Cigars  . Smokeless tobacco: Never Used     Comment: smoking 1-2 black and milds per day  . Alcohol use No     Comment:  - does smoke tobacco product called blackmilds  . Drug use: Yes    Frequency: 7.0 times per week    Types: Marijuana     Comment: 2-3 joints/daily- 12/30/13- pt advised not to smoke  marijuana night before or am of surgery  . Sexual activity: Yes    Partners: Male    Birth control/ protection: Condom   Other Topics Concern  . Not on file   Social History Narrative  . No narrative on file     Physical Exam: BP 100/76   Pulse 80   Ht 5\' 1"  (1.549 m)   Wt 171 lb 12.8 oz (77.9 kg)   LMP 03/21/2016   BMI 32.46 kg/m  Constitutional: generally well-appearing Psychiatric: alert and oriented x3 Eyes: extraocular movements intact Mouth: oral pharynx moist, no lesions Neck: supple no lymphadenopathy Cardiovascular: heart regular rate and rhythm Lungs: clear to auscultation bilaterally Abdomen: soft, mildly tender throughout her abdomen, nondistended, no obvious ascites, no peritoneal signs, normal bowel sounds Extremities: no lower extremity edema bilaterally Skin: no lesions on visible extremities   Assessment and plan: 36 y.o. female with  chronic abdominal Pain, somewhat postprandial  She has had 5 CT scans, ultrasound, HIDA scan, pelvic examinations in the past 2-1/2 years for a variety of different abdominal pains. I explained to her that I'm not sure why she is having such severe pains and her exam is a bit out of proportion to what I would expect it to be. I recommended we proceed with endoscopic testing for her abdominal pains both colonoscopy and EGD.    Please see the "Patient Instructions" section for addition details about the plan.   Rob Buntinganiel Jacobs, MD [facility] 03/21/2016, 1:52 PM  Cc: Beaulah DinningGambino, Christina M, MD

## 2016-03-28 ENCOUNTER — Ambulatory Visit (INDEPENDENT_AMBULATORY_CARE_PROVIDER_SITE_OTHER): Payer: Medicaid Other | Admitting: Family Medicine

## 2016-03-28 VITALS — BP 100/74 | HR 91 | Temp 98.1°F | Ht 61.0 in | Wt 172.2 lb

## 2016-03-28 DIAGNOSIS — N946 Dysmenorrhea, unspecified: Secondary | ICD-10-CM | POA: Diagnosis present

## 2016-03-28 DIAGNOSIS — F17200 Nicotine dependence, unspecified, uncomplicated: Secondary | ICD-10-CM

## 2016-03-28 MED ORDER — NORGESTIMATE-ETH ESTRADIOL 0.25-35 MG-MCG PO TABS: 1.0000 | ORAL_TABLET | Freq: Every day | ORAL | 11 refills | Status: DC

## 2016-03-28 MED ORDER — DICYCLOMINE HCL 20 MG PO TABS
20.0000 mg | ORAL_TABLET | Freq: Two times a day (BID) | ORAL | 0 refills | Status: DC
Start: 2016-03-28 — End: 2016-05-02

## 2016-03-28 NOTE — Progress Notes (Signed)
   Subjective:    Patient ID: Kara PresserSharon D Allen is a 36 y.o. female presenting with Endometriosis  on 03/28/2016  HPI: Has h/o lower abdominal pain Scheduled for colonoscopy. Here today with complaints of menstrual pain. Reports very painful cycles. Has pain x 1 wk prior to cycle. Cycles are regular. Sometimes cycles are heavy. Pain is worse when she is bleeding. Cycles last 4 days. Previously on OC's and patch and could not remember to take this. May want another child.  Review of Systems  Constitutional: Negative for chills and fever.  Respiratory: Negative for shortness of breath.   Cardiovascular: Negative for chest pain.  Gastrointestinal: Negative for abdominal pain, nausea and vomiting.  Genitourinary: Negative for dysuria.  Skin: Negative for rash.      Objective:    LMP 03/21/2016  Physical Exam  Constitutional: She is oriented to person, place, and time. She appears well-developed and well-nourished. No distress.  HENT:  Head: Normocephalic and atraumatic.  Eyes: No scleral icterus.  Neck: Neck supple.  Cardiovascular: Normal rate.   Pulmonary/Chest: Effort normal.  Abdominal: Soft.  Neurological: She is alert and oriented to person, place, and time.  Skin: Skin is warm and dry.  Psychiatric: She has a normal mood and affect.        Assessment & Plan:   Problem List Items Addressed This Visit      Unprioritized   CIGARETTE SMOKER (Chronic)    Consider smoking cessation as cannot remain on OC's after age 34.      Dysmenorrhea - Primary    Unlikely to be endometriosis, as patient had a scope with ovarian removal and noted to not have any endometriosis. Trial of OC's x 3 months, then can decide on further treatment options.      Relevant Medications   norgestimate-ethinyl estradiol (ORTHO-CYCLEN,SPRINTEC,PREVIFEM) 0.25-35 MG-MCG tablet      Total face-to-face time with patient: 15 minutes. Over 50% of encounter was spent on counseling and coordination of  care. Return in about 3 months (around 06/28/2016).  Reva Boresanya S Pratt 03/28/2016 2:40 PM

## 2016-03-28 NOTE — Assessment & Plan Note (Signed)
Consider smoking cessation as cannot remain on OC's after age 36.

## 2016-03-28 NOTE — Assessment & Plan Note (Addendum)
Unlikely to be endometriosis, as patient had a scope with ovarian removal and noted to not have any endometriosis. Trial of OC's x 3 months, then can decide on further treatment options.

## 2016-03-28 NOTE — Patient Instructions (Signed)
Dysmenorrhea Dysmenorrhea means painful cramps during your period (menstrual period). You will have pain in your lower belly (abdomen). The pain is caused by the tightening (contracting) of the muscles of the womb (uterus). The pain may be mild or very bad. With this condition, you may:  Have a headache.  Feel sick to your stomach (nauseous).  Throw up (vomit).  Have lower back pain. Follow these instructions at home: Helping pain and cramping   Put heat on your lower back or belly when you have pain or cramps. Use the heat source that your doctor tells you to use.  Place a towel between your skin and the heat.  Leave the heat on for 20-30 minutes.  Remove the heat if your skin turns bright red. This is especially important if you cannot feel pain, heat, or cold.  Do not have a heating pad on during sleep.  Do aerobic exercises. These include walking, swimming, or biking. These may help with cramps.  Massage your lower back or belly. This may help lessen pain. General instructions   Take over-the-counter and prescription medicines only as told by your doctor.  Do not drive or use heavy machinery while taking prescription pain medicine.  Avoid alcohol and caffeine during and right before your period. These can make cramps worse.  Do not use any products that have nicotine or tobacco. These include cigarettes and e-cigarettes. If you need help quitting, ask your doctor.  Keep all follow-up visits as told by your doctor. This is important. Contact a doctor if:  You have pain that gets worse.  You have pain that does not get better with medicine.  You have pain during sex.  You feel sick to your stomach or you throw up during your period, and medicine does not help. Get help right away if:  You pass out (faint). Summary  Dysmenorrhea means painful cramps during your period (menstrual period).  Put heat on your lower back or belly when you have pain or cramps.  Do  exercises like walking, swimming, or biking to help with cramps.  Contact a doctor if you have pain during sex. This information is not intended to replace advice given to you by your health care provider. Make sure you discuss any questions you have with your health care provider. Document Released: 03/16/2008 Document Revised: 01/05/2016 Document Reviewed: 01/05/2016 Elsevier Interactive Patient Education  2017 Elsevier Inc.  

## 2016-04-04 ENCOUNTER — Encounter: Payer: Self-pay | Admitting: Internal Medicine

## 2016-04-04 ENCOUNTER — Ambulatory Visit (INDEPENDENT_AMBULATORY_CARE_PROVIDER_SITE_OTHER): Payer: Medicaid Other | Admitting: Internal Medicine

## 2016-04-04 DIAGNOSIS — B20 Human immunodeficiency virus [HIV] disease: Secondary | ICD-10-CM | POA: Diagnosis not present

## 2016-04-04 MED ORDER — DAPSONE 100 MG PO TABS: 100.0000 mg | ORAL_TABLET | Freq: Every day | ORAL | 11 refills | Status: DC

## 2016-04-04 NOTE — Assessment & Plan Note (Signed)
She is doing much better now and able to tolerate her antiretroviral medications and keep them down. As a result her virus is suppressing. Her CD4 count is still low so I will start her on dapsone for pneumocystis prophylaxis. She will follow-up after lab work in 3 months.

## 2016-04-04 NOTE — Progress Notes (Signed)
Patient Active Problem List   Diagnosis Date Noted  . HIV (human immunodeficiency virus infection) (HCC) 10/01/2013    Priority: High  . Dysmenorrhea 03/28/2016  . Leukopenia   . Right upper quadrant abdominal pain   . Lower abdominal pain   . Gum inflammation 06/02/2014  . Pelvic pain in female   . 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 Hydronephrosis, left 10/01/2013  . BOILS, RECURRENT 02/12/2008  . ANXIETY 01/30/2006  . CIGARETTE SMOKER 01/30/2006  . Depression 01/30/2006  . Mild cognitive impairment 01/30/2006  . ALLERGIC RHINITIS 01/30/2006  . SYMPTOM, ENURESIS, NOCTURNAL 01/30/2006    Patient's Medications  New Prescriptions   No medications on file  Previous Medications   DICYCLOMINE (BENTYL) 20 MG TABLET    Take 1 tablet (20 mg total) by mouth 2 (two) times daily.   EDURANT 25 MG TABS TABLET    TAKE ONE TABLET BY MOUTH EVERY MORNING WITH BREAKFAST   EPIVIR 10 MG/ML SOLUTION    TAKE 30 MILLILITERS (2 TABLESPOONFULS) BY MOUTH ONCE DAILY   NORGESTIMATE-ETHINYL ESTRADIOL (ORTHO-CYCLEN,SPRINTEC,PREVIFEM) 0.25-35 MG-MCG TABLET    Take 1 tablet by mouth daily.   ONDANSETRON (ZOFRAN ODT) 4 MG DISINTEGRATING TABLET    Take 1 tablet (4 mg total) by mouth every 8 (eight) hours as needed for nausea or vomiting.   TIVICAY 50 MG TABLET    TAKE ONE (1) TABLET BY MOUTH EVERY DAY  Modified Medications   No medications on file  Discontinued Medications   No medications on file    Subjective: Kara Allen is in for her routine HIV follow-up visit. She is feeling better recently. She has been seeing Dr. Wendall Papa for her chronic problems with abdominal pain, nausea and vomiting. She has started on dicyclomine and has been making adjustments in her diet that have helped. She has been able to take her Epivir, Edurant and Tivicay recently without any difficulty. She has not missed any doses.  Review of Systems: Review of Systems  Constitutional: Negative for chills, diaphoresis,  fever, malaise/fatigue and weight loss.  HENT: Negative for sore throat.   Respiratory: Negative for cough, sputum production and shortness of breath.   Cardiovascular: Negative for chest pain.  Gastrointestinal: Positive for abdominal pain. Negative for diarrhea, heartburn, nausea and vomiting.  Genitourinary: Negative for dysuria and frequency.  Musculoskeletal: Negative for joint pain and myalgias.  Skin: Negative for rash.  Neurological: Negative for dizziness and headaches.  Psychiatric/Behavioral: Negative for depression and substance abuse. The patient is not nervous/anxious.     Past Medical History:  Diagnosis Date  . Cyst of ovary    PT STATES SHE IS TAKING ANTIBIOTICS FOR CYST ON OVARY  . HIV (human immunodeficiency virus infection) (HCC)   . Hydronephrosis, left   . Pyelonephritis   . Recurrent boils   . Trichomonas infection   . Urinary frequency    and nocturia    Social History  Substance Use Topics  . Smoking status: Current Every Day Smoker    Packs/day: 0.10    Years: 14.00    Types: Cigarettes, Cigars  . Smokeless tobacco: Never Used     Comment: smoking 1-2 black and milds per day  . Alcohol use No     Comment:  - does smoke tobacco product called blackmilds    Family History  Problem Relation Age of Onset  . HIV Mother     Allergies  Allergen Reactions  . Ibuprofen Rash and Other (  See Comments)    "Irritates my bladder"  . Latex Other (See Comments)    If pt wears latex gloves or any contact with latex causes itching and rash. Some tapes also cause itching, skin irritation - paper tape seems to be okay  . Sulfamethoxazole-Trimethoprim Other (See Comments)    Rash, chills and hives  . Tylenol [Acetaminophen] Other (See Comments)    Makes me pee more- irritates bladder    Objective:  Vitals:   04/04/16 1601  BP: 100/67  Pulse: 75  Temp: 98.3 F (36.8 C)  TempSrc: Oral  Weight: 173 lb (78.5 kg)  Height:  (1.549 m)   Body mass  index is 32.69 kg/m.  Physical Exam  Constitutional: She is oriented to person, place, and time.  She is smiling and in good spirits today.  HENT:  Mouth/Throat: No oropharyngeal exudate.  Eyes: Conjunctivae are normal.  Cardiovascular: Normal rate and regular rhythm.   No murmur heard. Pulmonary/Chest: Effort normal and breath sounds normal.  Abdominal: Soft. She exhibits no mass. There is no tenderness.  Musculoskeletal: Normal range of motion.  Neurological: She is alert and oriented to person, place, and time.  Skin: No rash noted.  Psychiatric: Mood and affect normal.    Lab Results Lab Results  Component Value Date   WBC 2.3 (L) 03/19/2016   HGB 12.2 03/19/2016   HCT 36.8 03/19/2016   MCV 94.4 03/19/2016   PLT 195 03/19/2016    Lab Results  Component Value Date   CREATININE 0.60 03/19/2016   BUN 9 03/19/2016   NA 135 03/19/2016   K 3.9 03/19/2016   CL 104 03/19/2016   CO2 23 03/19/2016    Lab Results  Component Value Date   ALT 9 03/19/2016   AST 17 03/19/2016   ALKPHOS 67 03/19/2016   BILITOT 0.3 03/19/2016    Lab Results  Component Value Date   CHOL 146 11/04/2014   HDL 53 11/04/2014   LDLCALC 79 11/04/2014   TRIG 70 11/04/2014   CHOLHDL 2.8 11/04/2014   HIV 1 RNA Quant (copies/mL)  Date Value  03/19/2016 28 (H)  12/06/2015 9,830  07/18/2015 29 (H)   CD4 T Cell Abs (/uL)  Date Value  03/19/2016 120 (L)  12/06/2015 140 (L)  07/18/2015 100 (L)     Problem List Items Addressed This Visit      High   HIV (human immunodeficiency virus infection) (HCC)    She is doing much better now and able to tolerate her antiretroviral medications and keep them down. As a result her virus is suppressing. Her CD4 count is still low so I will start her on dapsone for pneumocystis prophylaxis. She will follow-up after lab work in 3 months.           Cliffton Asters, MD [facility] [phone] pager    [phone] cell 04/04/2016, 4:21 PM

## 2016-05-02 ENCOUNTER — Other Ambulatory Visit: Payer: Self-pay | Admitting: Family Medicine

## 2016-05-02 MED ORDER — DICYCLOMINE HCL 20 MG PO TABS: 20.0000 mg | ORAL_TABLET | Freq: Two times a day (BID) | ORAL | 0 refills | Status: DC

## 2016-05-02 NOTE — Telephone Encounter (Signed)
Pt calling to request refill of:  Name of Medication(s): dicyclomine Last date of OV: 03-28-16 Pharmacy:  [facility]  Will route refill request to Clinic RN.  Discussed with patient policy to call pharmacy for future refills.  Also, discussed refills may take up to 48 hours to approve or deny.  Markus Jarvis

## 2016-05-08 ENCOUNTER — Encounter: Payer: Self-pay | Admitting: Gastroenterology

## 2016-05-08 ENCOUNTER — Emergency Department (HOSPITAL_COMMUNITY): Payer: Medicaid Other

## 2016-05-08 ENCOUNTER — Inpatient Hospital Stay (HOSPITAL_COMMUNITY)
Admission: EM | Admit: 2016-05-08 | Discharge: 2016-05-11 | DRG: 393 | Disposition: A | Discharge status: Home / Self Care | Payer: Medicaid Other | Attending: Cardiothoracic Surgery | Admitting: Cardiothoracic Surgery

## 2016-05-08 ENCOUNTER — Ambulatory Visit (AMBULATORY_SURGERY_CENTER): Payer: Medicaid Other | Admitting: Gastroenterology

## 2016-05-08 ENCOUNTER — Ambulatory Visit (INDEPENDENT_AMBULATORY_CARE_PROVIDER_SITE_OTHER)
Admission: RE | Admit: 2016-05-08 | Discharge: 2016-05-08 | Disposition: A | Discharge status: Home / Self Care | Payer: Medicaid Other | Source: Ambulatory Visit | Attending: Gastroenterology | Admitting: Gastroenterology

## 2016-05-08 ENCOUNTER — Encounter (HOSPITAL_COMMUNITY): Payer: Self-pay | Admitting: Emergency Medicine

## 2016-05-08 ENCOUNTER — Other Ambulatory Visit: Payer: Self-pay

## 2016-05-08 VITALS — BP 117/84 | HR 82 | Temp 98.9°F | Resp 14 | Ht 61.0 in | Wt 171.0 lb

## 2016-05-08 DIAGNOSIS — Y828 Other medical devices associated with adverse incidents: Secondary | ICD-10-CM | POA: Diagnosis present

## 2016-05-08 DIAGNOSIS — Z95828 Presence of other vascular implants and grafts: Secondary | ICD-10-CM

## 2016-05-08 DIAGNOSIS — K589 Irritable bowel syndrome without diarrhea: Secondary | ICD-10-CM | POA: Diagnosis present

## 2016-05-08 DIAGNOSIS — B20 Human immunodeficiency virus [HIV] disease: Secondary | ICD-10-CM | POA: Diagnosis present

## 2016-05-08 DIAGNOSIS — F17210 Nicotine dependence, cigarettes, uncomplicated: Secondary | ICD-10-CM | POA: Diagnosis present

## 2016-05-08 DIAGNOSIS — M542 Cervicalgia: Secondary | ICD-10-CM | POA: Diagnosis present

## 2016-05-08 DIAGNOSIS — Z830 Family history of human immunodeficiency virus [HIV] disease: Secondary | ICD-10-CM | POA: Diagnosis not present

## 2016-05-08 DIAGNOSIS — F419 Anxiety disorder, unspecified: Secondary | ICD-10-CM | POA: Diagnosis present

## 2016-05-08 DIAGNOSIS — R569 Unspecified convulsions: Secondary | ICD-10-CM | POA: Diagnosis not present

## 2016-05-08 DIAGNOSIS — F329 Major depressive disorder, single episode, unspecified: Secondary | ICD-10-CM | POA: Diagnosis present

## 2016-05-08 DIAGNOSIS — Z90722 Acquired absence of ovaries, bilateral: Secondary | ICD-10-CM

## 2016-05-08 DIAGNOSIS — Y838 Other surgical procedures as the cause of abnormal reaction of the patient, or of later complication, without mention of misadventure at the time of the procedure: Secondary | ICD-10-CM | POA: Diagnosis present

## 2016-05-08 DIAGNOSIS — F12988 Cannabis use, unspecified with other cannabis-induced disorder: Secondary | ICD-10-CM | POA: Diagnosis present

## 2016-05-08 DIAGNOSIS — Z886 Allergy status to analgesic agent status: Secondary | ICD-10-CM | POA: Diagnosis not present

## 2016-05-08 DIAGNOSIS — Q394 Esophageal web: Secondary | ICD-10-CM

## 2016-05-08 DIAGNOSIS — Z888 Allergy status to other drugs, medicaments and biological substances status: Secondary | ICD-10-CM

## 2016-05-08 DIAGNOSIS — R079 Chest pain, unspecified: Secondary | ICD-10-CM

## 2016-05-08 DIAGNOSIS — D72819 Decreased white blood cell count, unspecified: Secondary | ICD-10-CM | POA: Diagnosis present

## 2016-05-08 DIAGNOSIS — K223 Perforation of esophagus: Secondary | ICD-10-CM | POA: Diagnosis present

## 2016-05-08 DIAGNOSIS — Z79899 Other long term (current) drug therapy: Secondary | ICD-10-CM

## 2016-05-08 DIAGNOSIS — R112 Nausea with vomiting, unspecified: Secondary | ICD-10-CM | POA: Diagnosis not present

## 2016-05-08 DIAGNOSIS — R1319 Other dysphagia: Secondary | ICD-10-CM | POA: Diagnosis present

## 2016-05-08 DIAGNOSIS — R938 Abnormal findings on diagnostic imaging of other specified body structures: Secondary | ICD-10-CM | POA: Diagnosis not present

## 2016-05-08 DIAGNOSIS — K9189 Other postprocedural complications and disorders of digestive system: Secondary | ICD-10-CM | POA: Diagnosis not present

## 2016-05-08 DIAGNOSIS — E876 Hypokalemia: Secondary | ICD-10-CM | POA: Diagnosis present

## 2016-05-08 DIAGNOSIS — Z91040 Latex allergy status: Secondary | ICD-10-CM

## 2016-05-08 DIAGNOSIS — R1030 Lower abdominal pain, unspecified: Secondary | ICD-10-CM | POA: Diagnosis not present

## 2016-05-08 DIAGNOSIS — R1310 Dysphagia, unspecified: Secondary | ICD-10-CM

## 2016-05-08 DIAGNOSIS — R1084 Generalized abdominal pain: Secondary | ICD-10-CM | POA: Diagnosis not present

## 2016-05-08 LAB — BASIC METABOLIC PANEL
Anion gap: 9 (ref 5–15)
BUN: 6 mg/dL (ref 6–20)
CALCIUM: 9.2 mg/dL (ref 8.9–10.3)
CO2: 19 mmol/L — AB (ref 22–32)
CREATININE: 0.66 mg/dL (ref 0.44–1.00)
Chloride: 108 mmol/L (ref 101–111)
GFR calc non Af Amer: >60 mL/min (ref >60)
Glucose, Bld: 97 mg/dL (ref 65–99)
Potassium: 3.6 mmol/L (ref 3.5–5.1)
Sodium: 136 mmol/L (ref 135–145)

## 2016-05-08 LAB — CBC WITH DIFFERENTIAL/PLATELET
BASOS PCT: 0 %
Basophils Absolute: 0 10*3/uL (ref 0.0–0.1)
EOS ABS: 0 10*3/uL (ref 0.0–0.7)
Eosinophils Relative: 0 %
HEMATOCRIT: 37.5 % (ref 36.0–46.0)
Hemoglobin: 12.5 g/dL (ref 12.0–15.0)
Lymphocytes Relative: 18 %
Lymphs Abs: 0.9 10*3/uL (ref 0.7–4.0)
MCH: 30.8 pg (ref 26.0–34.0)
MCHC: 33.3 g/dL (ref 30.0–36.0)
MCV: 92.4 fL (ref 78.0–100.0)
MONO ABS: 0.3 10*3/uL (ref 0.1–1.0)
MONOS PCT: 7 %
NEUTROS ABS: 3.7 10*3/uL (ref 1.7–7.7)
Neutrophils Relative %: 75 %
Platelets: 216 10*3/uL (ref 150–400)
RBC: 4.06 MIL/uL (ref 3.87–5.11)
RDW: 13.1 % (ref 11.5–15.5)
WBC: 4.9 10*3/uL (ref 4.0–10.5)

## 2016-05-08 LAB — I-STAT CHEM 8, ED
BUN: 6 mg/dL (ref 6–20)
CALCIUM ION: 1.13 mmol/L — AB (ref 1.15–1.40)
Chloride: 109 mmol/L (ref 101–111)
Creatinine, Ser: 0.5 mg/dL (ref 0.44–1.00)
Glucose, Bld: 95 mg/dL (ref 65–99)
HEMATOCRIT: 39 % (ref 36.0–46.0)
Hemoglobin: 13.3 g/dL (ref 12.0–15.0)
Potassium: 3.6 mmol/L (ref 3.5–5.1)
Sodium: 140 mmol/L (ref 135–145)
TCO2: 20 mmol/L (ref 0–100)

## 2016-05-08 LAB — I-STAT BETA HCG BLOOD, ED (MC, WL, AP ONLY): I-stat hCG, quantitative: <5 m[IU]/mL (ref <5)

## 2016-05-08 MED ORDER — ONDANSETRON HCL 4 MG PO TABS: 4.0000 mg | ORAL_TABLET | Freq: Four times a day (QID) | ORAL | Status: DC | PRN

## 2016-05-08 MED ORDER — LAMIVUDINE 10 MG/ML PO SOLN: 300.0000 mg | Freq: Two times a day (BID) | ORAL | Status: DC

## 2016-05-08 MED ORDER — FENTANYL CITRATE (PF) 100 MCG/2ML IJ SOLN
50.0000 ug | Freq: Once | INTRAMUSCULAR | Status: AC
  Administered 2016-05-08: 50 ug via INTRAVENOUS
  Filled 2016-05-08: qty 2

## 2016-05-08 MED ORDER — DIATRIZOATE MEGLUMINE & SODIUM 66-10 % PO SOLN
ORAL | Status: AC
  Filled 2016-05-08: qty 30

## 2016-05-08 MED ORDER — ENOXAPARIN SODIUM 40 MG/0.4ML ~~LOC~~ SOLN
40.0000 mg | SUBCUTANEOUS | Status: DC
  Administered 2016-05-09 – 2016-05-11 (×3): 40 mg via SUBCUTANEOUS
  Filled 2016-05-08 (×4): qty 0.4

## 2016-05-08 MED ORDER — SODIUM CHLORIDE 0.9% FLUSH
3.0000 mL | Freq: Two times a day (BID) | INTRAVENOUS | Status: DC
  Administered 2016-05-09 – 2016-05-10 (×4): 3 mL via INTRAVENOUS
  Administered 2016-05-11: 08:00:00 via INTRAVENOUS

## 2016-05-08 MED ORDER — ONDANSETRON HCL 4 MG/2ML IJ SOLN
4.0000 mg | Freq: Four times a day (QID) | INTRAMUSCULAR | Status: DC | PRN
  Administered 2016-05-08 – 2016-05-09 (×4): 4 mg via INTRAVENOUS
  Filled 2016-05-08 (×4): qty 2

## 2016-05-08 MED ORDER — IOPAMIDOL (ISOVUE-300) INJECTION 61%
INTRAVENOUS | Status: AC
  Administered 2016-05-08: 75 mL
  Filled 2016-05-08: qty 75

## 2016-05-08 MED ORDER — DEXTROSE-NACL 5-0.45 % IV SOLN
INTRAVENOUS | Status: DC
  Administered 2016-05-08: 22:00:00 via INTRAVENOUS

## 2016-05-08 MED ORDER — FENTANYL CITRATE (PF) 100 MCG/2ML IJ SOLN
25.0000 ug | INTRAMUSCULAR | Status: DC | PRN
  Administered 2016-05-08 – 2016-05-09 (×3): 25 ug via INTRAVENOUS
  Filled 2016-05-08 (×4): qty 2

## 2016-05-08 MED ORDER — PIPERACILLIN-TAZOBACTAM 3.375 G IVPB
3.3750 g | Freq: Three times a day (TID) | INTRAVENOUS | Status: DC
  Administered 2016-05-08 – 2016-05-11 (×8): 3.375 g via INTRAVENOUS
  Filled 2016-05-08 (×11): qty 50

## 2016-05-08 MED ORDER — SODIUM CHLORIDE 0.9 % IV SOLN: 500.0000 mL | INTRAVENOUS | Status: DC

## 2016-05-08 MED ORDER — DIATRIZOATE MEGLUMINE & SODIUM 66-10 % PO SOLN
ORAL | Status: AC
  Administered 2016-05-08: 120 mL via ORAL
  Filled 2016-05-08: qty 120

## 2016-05-08 MED ORDER — LAMIVUDINE 10 MG/ML PO SOLN
300.0000 mg | Freq: Every day | ORAL | Status: DC
  Administered 2016-05-08 – 2016-05-10 (×3): 300 mg via ORAL
  Filled 2016-05-08 (×3): qty 30

## 2016-05-08 NOTE — ED Provider Notes (Signed)
[facility]-EMERGENCY DEPT Provider Note   CSN: [id number] Arrival date & time: 05/08/16  1708     History   Chief Complaint Chief Complaint  Patient presents with  . Airway Obstruction    Possible esophageal perforation, sent from GI clinic, had EGD completed today     HPI Kara Allen is a 36 y.o. female.  HPI   Pt is a 36 yo female with PMH of HIV and seizures who presents to the ED from GI clinic due to concern for possible esophageal perforation after having an EGD performed prior to arrival. Patient reports having an EGD performed today due to history of chronic lower abdominal pain. Patient reports when she woke up after the procedure she began having pain to the right side of her neck which she notes was worse with swallowing. Patient states she is unable to swallow due to pain resulting in her spinning into an emesis bag. She also reports having the sensation of feeling like something wants to move up her esophagus from her abdomen. Neck films performed in GI clinic showed paravertebral air. Patient was sent to the ED to have CT scan performed for further evaluation. Patient denies trismus, cough, vomiting, chest pain, difficulty breathing, abdominal pain. Denies use of anticoagulants.  PCP- Dr. Jonathon JordanGambino GI- Dr. Christella HartiganJacobs  Past Medical History:  Diagnosis Date  . Cyst of ovary    PT STATES SHE IS TAKING ANTIBIOTICS FOR CYST ON OVARY  . HIV (human immunodeficiency virus infection) (HCC)   . Hydronephrosis, left   . Pyelonephritis   . Recurrent boils   . Seasonal allergies   . Seizures (HCC) 11/2015   encouraged further eval, not followed  . Trichomonas infection   . Urinary frequency    and nocturia    Patient Active Problem List   Diagnosis Date Noted  . Dysmenorrhea 03/28/2016  . Leukopenia   . Right upper quadrant abdominal pain   . Lower abdominal pain   . Gum inflammation 06/02/2014  . Pelvic pain in female   . 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 HIV (human  immunodeficiency virus infection) (HCC) 10/01/2013  . Hydronephrosis, left 10/01/2013  . BOILS, RECURRENT 02/12/2008  . ANXIETY 01/30/2006  . CIGARETTE SMOKER 01/30/2006  . Depression 01/30/2006  . Mild cognitive impairment 01/30/2006  . ALLERGIC RHINITIS 01/30/2006  . SYMPTOM, ENURESIS, NOCTURNAL 01/30/2006    Past Surgical History:  Procedure Laterality Date  . CESAREAN SECTION  2006  . CYSTOSCOPY WITH RETROGRADE PYELOGRAM, URETEROSCOPY AND STENT PLACEMENT Bilateral 10/28/2013   Procedure: CYSTOSCOPY WITH RETROGRADE PYELOGRAM, CYSTOGRAM;  Surgeon: Valetta Fulleravid S Grapey, MD;  Location: [facility];  Service: Urology;  Laterality: Bilateral;  . LAPAROSCOPIC UNILATERAL SALPINGO OOPHERECTOMY Left 01/05/2014   Procedure: LAPAROSCOPIC LEFT SALPINGO OOPHORECTOMY;  Surgeon: Catalina AntiguaPeggy Constant, MD;  Location: [facility];  Service: Gynecology;  Laterality: Left;    OB History    Gravida Para Term Preterm AB Living   2 1 1   1 1    SAB TAB Ectopic Multiple Live Births   1               Home Medications    Prior to Admission medications   Medication Sig Start Date End Date Taking? Authorizing Provider  dicyclomine (BENTYL) 20 MG tablet Take 1 tablet (20 mg total) by mouth 2 (two) times daily. 05/02/16 06/01/16  Beaulah DinningGambino, Christina M, MD  EDURANT 25 MG TABS tablet TAKE ONE TABLET BY MOUTH EVERY MORNING WITH BREAKFAST 12/13/15   Cliffton Astersampbell, John, MD  EPIVIR 10 MG/ML solution TAKE 30 MILLILITERS (2 TABLESPOONFULS) BY MOUTH ONCE DAILY 12/13/15   Cliffton Asters, MD  norgestimate-ethinyl estradiol (ORTHO-CYCLEN,SPRINTEC,PREVIFEM) 0.25-35 MG-MCG tablet Take 1 tablet by mouth daily. 03/28/16   Reva Bores, MD  ondansetron (ZOFRAN ODT) 4 MG disintegrating tablet Take 1 tablet (4 mg total) by mouth every 8 (eight) hours as needed for nausea or vomiting. Patient not taking: Reported on 05/08/2016 11/21/15   Pricilla Loveless, MD  TIVICAY 50 MG tablet TAKE ONE (1) TABLET BY MOUTH EVERY DAY 12/13/15   Cliffton Asters, MD     Family History Family History  Problem Relation Age of Onset  . Adopted: Yes  . HIV Mother     Social History Social History  Substance Use Topics  . Smoking status: Current Every Day Smoker    Packs/day: 0.10    Years: 14.00    Types: Cigarettes, Cigars  . Smokeless tobacco: Never Used     Comment: smoking 1-2 black and milds per day  . Alcohol use No     Comment:  - does smoke tobacco product called blackmilds     Allergies   Ibuprofen; Latex; Sulfamethoxazole-trimethoprim; and Tylenol [acetaminophen]   Review of Systems Review of Systems  HENT: Positive for trouble swallowing.        Neck pain  All other systems reviewed and are negative.    Physical Exam Updated Vital Signs BP 120/88   Pulse 96   Temp 98.6 F (37 C) (Oral)   Resp 18   LMP 04/09/2016   SpO2 97%   Physical Exam  Constitutional: She is oriented to person, place, and time. She appears well-developed and well-nourished. No distress.  Pt spitting into emesis bag during interview and exam.  HENT:  Head: Normocephalic and atraumatic.  Mouth/Throat: Uvula is midline, oropharynx is clear and moist and mucous membranes are normal. No trismus in the jaw. No uvula swelling. No oropharyngeal exudate, posterior oropharyngeal edema, posterior oropharyngeal erythema or tonsillar abscesses. No tonsillar exudate.  Pt unable to tolerate secretions due to reported pain.  Eyes: Conjunctivae and EOM are normal. Right eye exhibits no discharge. Left eye exhibits no discharge. No scleral icterus.  Neck: Normal range of motion. Neck supple. No spinous process tenderness present. No neck rigidity. No edema and normal range of motion present.    TTP over right anterior neck, no crepitance present.  Cardiovascular: Normal rate, regular rhythm, normal heart sounds and intact distal pulses.   Pulmonary/Chest: Effort normal and breath sounds normal. No stridor. No respiratory distress. She has no wheezes. She has no  rales. She exhibits no tenderness.  Abdominal: Soft. Bowel sounds are normal. She exhibits no distension and no mass. There is no tenderness. There is no rebound and no guarding. No hernia.  Musculoskeletal: Normal range of motion. She exhibits no edema.  Neurological: She is alert and oriented to person, place, and time.  Skin: Skin is warm and dry. She is not diaphoretic.  Nursing note and vitals reviewed.    ED Treatments / Results  Labs (all labs ordered are listed, but only abnormal results are displayed) Labs Reviewed  CBC WITH DIFFERENTIAL/PLATELET  BASIC METABOLIC PANEL  I-STAT CHEM 8, ED  I-STAT BETA HCG BLOOD, ED ([facility], [facility], [facility] ONLY)    EKG  EKG Interpretation None       Radiology Dg Neck Soft Tissue  Result Date: 05/08/2016 CLINICAL DATA:  Sore throat and neck pain after endoscopy today. Pain with swallowing. EXAM: NECK SOFT  TISSUES - 1+ VIEW COMPARISON:  None. FINDINGS: There is irregular gas tracking in prevertebral soft tissues seen on the lateral view only. The epiglottis and aryepiglottic folds appear normal. No bony abnormality is seen. IMPRESSION: Prevertebral soft tissue gas is seen on the lateral view only worrisome for pneumomediastinum secondary to esophageal injury. CT neck and chest with IV contrast recommended for further evaluation. One swallow of water soluble contrast immediately prior to scanning is also recommended. These results were called by telephone at the time of interpretation on 05/08/2016 at 4:38 pm to , who verbally acknowledged these results. Electronically Signed   By: Drusilla Kanner M.D.   On: 05/08/2016 16:43   Dg Chest 2 View  Result Date: 05/08/2016 CLINICAL DATA:  Post endoscopy.  Sore throat.  Pain swallowing. EXAM: CHEST  2 VIEW COMPARISON:  11/18/2013. FINDINGS: The heart size and mediastinal contours are within normal limits. Both lungs are clear. The visualized skeletal structures are unremarkable. IMPRESSION: No active cardiopulmonary  disease. Electronically Signed   By: Elsie Stain M.D.   On: 05/08/2016 16:29    Procedures Procedures (including critical care time)  Medications Ordered in ED Medications  fentaNYL (SUBLIMAZE) injection 50 mcg (50 mcg Intravenous Given 05/08/16 1737)     Initial Impression / Assessment and Plan / ED Course  I have reviewed the triage vital signs and the nursing notes.  Pertinent labs & imaging results that were available during my care of the patient were reviewed by me and considered in my medical decision making (see chart for details).    Pt presents from GI clinic due to concern for esophageal perf s/p EGD procedure performed PTA. Pt reports associated neck pain and difficulty swallowing. VSS. Exam showed TTP over right anterior neck, no crepitance present. Pt unable to tolerate secretion due to reported pain. Lungs CTAB. RRR. Abdomen soft, nontender. Pt given pain meds in the ED. I spoke with radiology regarding CT imaging advised to perform from GI. CT neck and chest with contrast ordered. I was notified by radiology that pt's CT showed extraluminal air in superior mediastinum extending along esophagus up to neck, concern for tear at distal esophagus/GE junction, no contrast extravasation present. Consulted CT surgery. Dr. Donata Clay advised to order gastrografin with fluoro for further evaluation of possible esophageal perforation. Pt admitted by Dr. Donata Clay.   Final Clinical Impressions(s) / ED Diagnoses   Final diagnoses:  Neck pain    New Prescriptions New Prescriptions   No medications on file      Gilford Rile 05/08/16 2014    Geoffery Lyons, MD 05/08/16 2149

## 2016-05-08 NOTE — Patient Instructions (Signed)
YOU HAD AN ENDOSCOPIC PROCEDURE TODAY AT THE  ENDOSCOPY CENTER:   Refer to the procedure report that was given to you for any specific questions about what was found during the examination.  If the procedure report does not answer your questions, please call your gastroenterologist to clarify.  If you requested that your care partner not be given the details of your procedure findings, then the procedure report has been included in a sealed envelope for you to review at your convenience later.  YOU SHOULD EXPECT: Some feelings of bloating in the abdomen. Passage of more gas than usual.  Walking can help get rid of the air that was put into your GI tract during the procedure and reduce the bloating. If you had a lower endoscopy (such as a colonoscopy or flexible sigmoidoscopy) you may notice spotting of blood in your stool or on the toilet paper. If you underwent a bowel prep for your procedure, you may not have a normal bowel movement for a few days.  Please Note:  You might notice some irritation and congestion in your nose or some drainage.  This is from the oxygen used during your procedure.  There is no need for concern and it should clear up in a day or so.  SYMPTOMS TO REPORT IMMEDIATELY:   Following lower endoscopy (colonoscopy or flexible sigmoidoscopy):  Excessive amounts of blood in the stool  Significant tenderness or worsening of abdominal pains  Swelling of the abdomen that is new, acute  Fever of 100F or higher   Following upper endoscopy (EGD)  Vomiting of blood or coffee ground material  New chest pain or pain under the shoulder blades  Painful or persistently difficult swallowing  New shortness of breath  Fever of 100F or higher  Black, tarry-looking stools  For urgent or emergent issues, a gastroenterologist can be reached at any hour by calling [phone].   DIET:  Soft foods today advance to normal diet tomorrow per Dr. Christella HartiganJacobs   ACTIVITY:  You should  plan to take it easy for the rest of today and you should NOT DRIVE or use heavy machinery until tomorrow (because of the sedation medicines used during the test).    FOLLOW UP: Our staff will call the number listed on your records the next business day following your procedure to check on you and address any questions or concerns that you may have regarding the information given to you following your procedure. If we do not reach you, we will leave a message.  However, if you are feeling well and you are not experiencing any problems, there is no need to return our call.  We will assume that you have returned to your regular daily activities without incident.  If any biopsies were taken you will be contacted by phone or by letter within the next 1-3 weeks.  Please call us at [phone] if you have not heard about the biopsies in 3 weeks.    SIGNATURES/CONFIDENTIALITY: You and/or your care partner have signed paperwork which will be entered into your electronic medical record.  These signatures attest to the fact that that the information above on your After Visit Summary has been reviewed and is understood.  Full responsibility of the confidentiality of this discharge information lies with you and/or your care-partner.

## 2016-05-08 NOTE — Op Note (Signed)
[facility] Patient Name: Kara Allen Procedure Date: 05/08/2016 3:00 PM MRN: [id number] Endoscopist: Rachael Feeaniel P Annaka Cleaver , MD Age: 36 Referring MD:  Date of Birth: 01/29/1980 Gender: Female Account #: [id number] Procedure:                Colonoscopy Indications:              Lower abdominal pain Medicines:                Monitored Anesthesia Care Procedure:                Pre-Anesthesia Assessment:                           - Prior to the procedure, a History and Physical                            was performed, and patient medications and                            allergies were reviewed. The patient's tolerance of                            previous anesthesia was also reviewed. The risks                            and benefits of the procedure and the sedation                            options and risks were discussed with the patient.                            All questions were answered, and informed consent                            was obtained. Prior Anticoagulants: The patient has                            taken no previous anticoagulant or antiplatelet                            agents. ASA Grade Assessment: II - A patient with                            mild systemic disease. After reviewing the risks                            and benefits, the patient was deemed in                            satisfactory condition to undergo the procedure.                           After obtaining informed consent, the colonoscope  was passed under direct vision. Throughout the                            procedure, the patient's blood pressure, pulse, and                            oxygen saturations were monitored continuously. The                            Colonoscope was introduced through the anus and                            advanced to the the terminal ileum. The colonoscopy                            was performed without difficulty. The  patient                            tolerated the procedure well. The quality of the                            bowel preparation was excellent. The terminal                            ileum, ileocecal valve, appendiceal orifice, and                            rectum were photographed. Scope In: 3:02:51 PM Scope Out: 3:09:50 PM Scope Withdrawal Time: 0 hours 4 minutes 19 seconds  Total Procedure Duration: 0 hours 6 minutes 59 seconds  Findings:                 The terminal ileum appeared normal.                           The entire examined colon appeared normal on direct                            and retroflexion views. Complications:            No immediate complications. Estimated blood loss:                            None. Estimated Blood Loss:     Estimated blood loss: none. Impression:               - The examined portion of the ileum was normal.                           - The entire examined colon is normal on direct and                            retroflexion views.                           - No specimens collected. Recommendation:           -  Patient has a contact number available for                            emergencies. The signs and symptoms of potential                            delayed complications were discussed with the                            patient. Return to normal activities tomorrow.                            Written discharge instructions were provided to the                            patient.                           - Resume previous diet.                           - Continue present medications.                           - Repeat colonoscopy at age 61 for screening                            purposes. Rachael Fee, MD 05/08/2016 3:19:48 PM This report has been signed electronically.

## 2016-05-08 NOTE — Progress Notes (Signed)
!  555-DrChristella Hartigan. Jacobs in to check pt again. Dr Christella HartiganJacobs ordering an xray.

## 2016-05-08 NOTE — Progress Notes (Signed)
Dr Christella Hartiganjacobs in to talk w/pt after received xray report.  Patient to be taken to [place] ER by her family member.Dr Christella HartiganJacobs is calling report to er chg nurse in ER. @1645  pt is stable  c/o rt sided throat pain . No sub q air palpated neck or chest.pt denies chest pain.Pt dc'd by wheelchair to car with carepartner.

## 2016-05-08 NOTE — Progress Notes (Signed)
  Subjective: 36 yo AA female HIV positive, treated by ID clinic Has hx of swallowing difficulty Had EGD today with dilatation of upper esophageal web, followed by neck pain heart burn and odynophagia Neck xray with pre-cervical air Chest CT w/o contrast with mediastinal air- mild, no pleural effusion gastrografin swallow without free leak Patient not toxic, tachycardic Will admit for iv fluid and antibiotics- chronic leukopenia Objective: Vital signs in last 24 hours: Temp:  [98.6 F (37 C)-98.9 F (37.2 C)] 98.6 F (37 C) (05/08 1712) Pulse Rate:  [77-118] 78 (05/08 1938) Resp:  [12-22] 17 (05/08 1938) BP: (100-133)/(55-89) 113/89 (05/08 1938) SpO2:  [97 %-100 %] 100 % (05/08 1938) Weight:  [171 lb (77.6 kg)] 171 lb (77.6 kg) (05/08 1431)  Hemodynamic parameters for last 24 hours:  stable  Intake/Output from previous day: No intake/output data recorded. Intake/Output this shift: No intake/output data recorded.       Exam    General- alert and comfortable   Lungs- clear without rales, wheezes   Cor- regular rate and rhythm, no murmur , gallop   Abdomen- soft, non-tender   Extremities - warm, non-tender, minimal edema   Neuro- oriented, appropriate, no focal weakness   Lab Results:  Recent Labs  05/08/16 1732 05/08/16 1747  WBC 4.9  --   HGB 12.5 13.3  HCT 37.5 39.0  PLT 216  --    BMET:  Recent Labs  05/08/16 1732 05/08/16 1747  NA 136 140  K 3.6 3.6  CL 108 109  CO2 19*  --   GLUCOSE 97 95  BUN 6 6  CREATININE 0.66 0.50  CALCIUM 9.2  --     PT/INR: No results for input(s): LABPROT, INR in the last 72 hours. ABG    Component Value Date/Time   TCO2 20 05/08/2016 1747   CBG (last 3)  No results for input(s): GLUCAP in the last 72 hours.  Assessment/Plan: S/P  Admit for iv fluids, Zosyn and observation   LOS: 0 days    Kathlee Nationseter Van Trigt III 05/08/2016

## 2016-05-08 NOTE — ED Triage Notes (Signed)
Pt to ER sent from GI clinic for possible esophageal perforation. Pt had EGD completed today. Began to feel as if she couldn't swallow, GI clinic did xrays which showed possible perf. Pt is speaking in complete sentences but voice is muffled and states she cannot swallow her secretions. Vitals are stable at present. A/ ox4.

## 2016-05-08 NOTE — Progress Notes (Signed)
She had neck pain, swallowing difficulty after the EGD.  Plain neck films suggest prevertebral air.  Her neck pain is improving but still uncomfortable. Her fiance is going to [facility].  I already spoke with charge nurse about her and relayed radiology recommendation about CT scan neck, chest with 1 swallow of water soluble contrast.  No neck crepitance.

## 2016-05-08 NOTE — Progress Notes (Signed)
Spontaneous respirations throughout. VSS. Resting comfortably. To PACU on room air. Report to  Penny RN.  

## 2016-05-08 NOTE — Op Note (Signed)
[facility] Patient Name: Kara Allen Procedure Date: 05/08/2016 2:57 PM MRN: [id number] Endoscopist: Rachael Fee , MD Age: 36 Referring MD:  Date of Birth: 09-22-1980 Gender: Female Account #: [id number] Procedure:                Upper GI endoscopy Indications:              Lower abdominal pain, Nausea with vomiting Medicines:                Monitored Anesthesia Care Procedure:                Pre-Anesthesia Assessment:                           - Prior to the procedure, a History and Physical                            was performed, and patient medications and                            allergies were reviewed. The patient's tolerance of                            previous anesthesia was also reviewed. The risks                            and benefits of the procedure and the sedation                            options and risks were discussed with the patient.                            All questions were answered, and informed consent                            was obtained. Prior Anticoagulants: The patient has                            taken no previous anticoagulant or antiplatelet                            agents. ASA Grade Assessment: II - A patient with                            mild systemic disease. After reviewing the risks                            and benefits, the patient was deemed in                            satisfactory condition to undergo the procedure.                           After obtaining informed consent, the endoscope was  passed under direct vision. Throughout the                            procedure, the patient's blood pressure, pulse, and                            oxygen saturations were monitored continuously. The                            Endoscope was introduced through the mouth, and                            advanced to the second part of duodenum. The upper                            GI endoscopy  was accomplished without difficulty.                            The patient tolerated the procedure well. Scope In: Scope Out: Findings:                 There was a proximal esophageal ring/web that was                            dilated with simple scope passage (mild resistence)                            with adult gastroscope; resulting minor superficial                            tear with self limited oozing.                           The exam was otherwise without abnormality. Complications:            No immediate complications. Estimated blood loss:                            None. Estimated Blood Loss:     Estimated blood loss: none. Impression:               - Proximal esophageal ring/web dilated with scope                            passage.                           - Otherwise normal examination.                           -Overall symptoms (abdominal pain, vomiting often                            relieved with hot shower or bath) is consistent  with marijuana hyperemesis syndrome. You should try                            significantly cutting back your consumption to see                            if your vomiting, pains improve. Recommendation:           - Patient has a contact number available for                            emergencies. The signs and symptoms of potential                            delayed complications were discussed with the                            patient. Return to normal activities tomorrow.                            Written discharge instructions were provided to the                            patient.                           - Resume previous diet.                           - Continue present medications.                           - No repeat upper endoscopy. Rachael Feeaniel P Juwana Thoreson, MD 05/08/2016 3:25:29 PM This report has been signed electronically.

## 2016-05-08 NOTE — Progress Notes (Signed)
Pt entered recovery rm moaning and crying,Crna reported that pt told her before procedure that she wakes up from anesthesia crying and talking crazy.pt calmed down from crying after 10 minutes then moaning and thrashing on stretcher saying her throat is hurting bad. Encouraged pt to swallow saliva the patient denies chest pain. Dr Christella HartiganJacobs in to check pt and will come back in 15 minutes to recheck her.

## 2016-05-09 ENCOUNTER — Inpatient Hospital Stay (HOSPITAL_COMMUNITY): Payer: Medicaid Other

## 2016-05-09 ENCOUNTER — Telehealth: Payer: Self-pay | Admitting: *Deleted

## 2016-05-09 DIAGNOSIS — K223 Perforation of esophagus: Secondary | ICD-10-CM

## 2016-05-09 DIAGNOSIS — R938 Abnormal findings on diagnostic imaging of other specified body structures: Secondary | ICD-10-CM

## 2016-05-09 DIAGNOSIS — R1084 Generalized abdominal pain: Secondary | ICD-10-CM

## 2016-05-09 LAB — CBC
HCT: 34.5 % — ABNORMAL LOW (ref 36.0–46.0)
Hemoglobin: 11.5 g/dL — ABNORMAL LOW (ref 12.0–15.0)
MCH: 30.6 pg (ref 26.0–34.0)
MCHC: 33.3 g/dL (ref 30.0–36.0)
MCV: 91.8 fL (ref 78.0–100.0)
Platelets: 192 10*3/uL (ref 150–400)
RBC: 3.76 MIL/uL — ABNORMAL LOW (ref 3.87–5.11)
RDW: 13.2 % (ref 11.5–15.5)
WBC: 4.6 10*3/uL (ref 4.0–10.5)

## 2016-05-09 LAB — BASIC METABOLIC PANEL
Anion gap: 9 (ref 5–15)
BUN: 7 mg/dL (ref 6–20)
CO2: 21 mmol/L — ABNORMAL LOW (ref 22–32)
Calcium: 8.8 mg/dL — ABNORMAL LOW (ref 8.9–10.3)
Chloride: 105 mmol/L (ref 101–111)
Creatinine, Ser: 0.63 mg/dL (ref 0.44–1.00)
GFR calc Af Amer: >60 mL/min (ref >60)
GFR calc non Af Amer: >60 mL/min (ref >60)
Glucose, Bld: 132 mg/dL — ABNORMAL HIGH (ref 65–99)
Potassium: 3.2 mmol/L — ABNORMAL LOW (ref 3.5–5.1)
Sodium: 135 mmol/L (ref 135–145)

## 2016-05-09 LAB — MRSA PCR SCREENING: MRSA by PCR: NEGATIVE

## 2016-05-09 MED ORDER — DOLUTEGRAVIR SODIUM 50 MG PO TABS
50.0000 mg | ORAL_TABLET | Freq: Every day | ORAL | Status: DC
  Administered 2016-05-09 – 2016-05-10 (×2): 50 mg via ORAL
  Filled 2016-05-09 (×2): qty 1

## 2016-05-09 MED ORDER — LORAZEPAM 2 MG/ML IJ SOLN
2.0000 mg | Freq: Once | INTRAMUSCULAR | Status: AC
  Administered 2016-05-09: 2 mg via INTRAVENOUS
  Filled 2016-05-09: qty 1

## 2016-05-09 MED ORDER — DOLUTEGRAVIR SODIUM 50 MG PO TABS
50.0000 mg | ORAL_TABLET | Freq: Every day | ORAL | Status: DC
  Filled 2016-05-09: qty 1

## 2016-05-09 MED ORDER — HYDROMORPHONE HCL 1 MG/ML IJ SOLN
INTRAMUSCULAR | Status: AC
  Filled 2016-05-09: qty 1

## 2016-05-09 MED ORDER — FENTANYL 25 MCG/HR TD PT72: 50.0000 ug | MEDICATED_PATCH | TRANSDERMAL | Status: DC

## 2016-05-09 MED ORDER — PANTOPRAZOLE SODIUM 40 MG IV SOLR
40.0000 mg | INTRAVENOUS | Status: DC
  Administered 2016-05-09 – 2016-05-11 (×3): 40 mg via INTRAVENOUS
  Filled 2016-05-09 (×3): qty 40

## 2016-05-09 MED ORDER — RILPIVIRINE HCL 25 MG PO TABS
25.0000 mg | ORAL_TABLET | Freq: Every day | ORAL | Status: DC
  Administered 2016-05-10: 25 mg via ORAL
  Filled 2016-05-09 (×2): qty 1

## 2016-05-09 MED ORDER — RILPIVIRINE HCL 25 MG PO TABS
25.0000 mg | ORAL_TABLET | Freq: Every day | ORAL | Status: DC
  Filled 2016-05-09: qty 1

## 2016-05-09 MED ORDER — LORAZEPAM 2 MG/ML IJ SOLN
1.0000 mg | INTRAMUSCULAR | Status: DC | PRN
  Administered 2016-05-11: 1 mg via INTRAVENOUS
  Filled 2016-05-09: qty 1

## 2016-05-09 MED ORDER — DIPHENHYDRAMINE HCL 50 MG/ML IJ SOLN
12.5000 mg | Freq: Four times a day (QID) | INTRAMUSCULAR | Status: DC | PRN
  Administered 2016-05-09 – 2016-05-11 (×7): 12.5 mg via INTRAVENOUS
  Filled 2016-05-09 (×7): qty 1

## 2016-05-09 MED ORDER — KCL IN DEXTROSE-NACL 30-5-0.45 MEQ/L-%-% IV SOLN
INTRAVENOUS | Status: DC
  Administered 2016-05-09 – 2016-05-11 (×4): via INTRAVENOUS
  Filled 2016-05-09 (×6): qty 1000

## 2016-05-09 MED ORDER — DAPSONE 100 MG PO TABS
100.0000 mg | ORAL_TABLET | Freq: Every day | ORAL | Status: DC
  Administered 2016-05-09 – 2016-05-10 (×2): 100 mg via ORAL
  Filled 2016-05-09 (×2): qty 1

## 2016-05-09 MED ORDER — HYDROMORPHONE HCL 1 MG/ML IJ SOLN
1.0000 mg | INTRAMUSCULAR | Status: DC | PRN
  Administered 2016-05-09 – 2016-05-10 (×9): 1 mg via INTRAVENOUS
  Filled 2016-05-09 (×8): qty 1

## 2016-05-09 NOTE — Progress Notes (Signed)
Pt instructed to call out for assistance with getting up to bathroom or help around the room. Pt continues to get up unassisted and without calling out, taking herself off the monitor. RN educated pt on safety concerns considering her seizure episodes. Pt not compliant with RN instructions. Will continue to monitor.

## 2016-05-09 NOTE — Progress Notes (Signed)
      [address]       [place] [id number]             [phone]      PM rounds  Sitting up in bed  BP 111/84   Pulse 77   Temp 97.9 F (36.6 C) (Oral)   Resp 18   Ht 5\' 1"  (1.549 m)   Wt 166 lb 7.2 oz (75.5 kg)   LMP 04/09/2016   SpO2 97%   BMI 31.45 kg/m    Intake/Output Summary (Last 24 hours) at 05/09/16 1720 Last data filed at 05/09/16 1300  Gross per 24 hour  Intake          1008.34 ml  Output                0 ml  Net          1008.34 ml   Remains NPO  Afebrile on Zosyn  Shylie Polo C. Dorris FetchHendrickson, MD [facility] [phone]

## 2016-05-09 NOTE — Telephone Encounter (Signed)
Phone call not placed to patient as documentation shows patient to be in ICU.

## 2016-05-09 NOTE — Progress Notes (Signed)
Daily Rounding Note  05/09/2016, 10:29 AM  LOS: 1 day   SUBJECTIVE:   Chief complaint: odysnophagia, chest pain: improved.  Complaining of hunger.  c/o itching with Fentanyl.    Had received Fentanyl earlier than scheduled for 10/10 chest pain about 11:30 PM.  About 1 hour later she was found limp, moaning, unresponsive.  Moved to bed where uncontrollable shaking observed.  RR team called.  Treated with Ativan for suspected anxiety vs seizure.   OBJECTIVE:         Vital signs in last 24 hours:    Temp:  [97.3 F (36.3 C)-99.2 F (37.3 C)] 97.3 F (36.3 C) (05/09 0808) Pulse Rate:  [57-118] 71 (05/09 0800) Resp:  [12-24] 18 (05/09 0800) BP: (99-133)/(52-89) 110/79 (05/09 0800) SpO2:  [97 %-100 %] 100 % (05/09 0800) Weight:  [75.5 kg (166 lb 7.2 oz)-77.6 kg (171 lb)] 75.5 kg (166 lb 7.2 oz) (05/09 0500)   Filed Weights   05/08/16 2134 05/09/16 0500  Weight: 76.3 kg (168 lb 3.4 oz) 75.5 kg (166 lb 7.2 oz)   General: anxious, alert, belching.  Does not look ill.    Heart: RRR Chest: clear bil.   Abdomen: soft, NT, ND.  Active BS  Extremities: no CCE Neuro/Psych:  Scratching her back and legs, oriented x 3.  Rapid speech, hard to get a word in edgewise.  Oriented x  3  Intake/Output from previous day: 05/08 0701 - 05/09 0700 In: 821.7 [I.V.:771.7; IV Piggyback:50] Out: -   Intake/Output this shift: Total I/O In: 50 [IV Piggyback:50] Out: -   Lab Results:  Recent Labs  05/08/16 1732 05/08/16 1747 05/09/16 0559  WBC 4.9  --  4.6  HGB 12.5 13.3 11.5*  HCT 37.5 39.0 34.5*  PLT 216  --  192   BMET  Recent Labs  05/08/16 1732 05/08/16 1747 05/09/16 0559  NA 136 140 135  K 3.6 3.6 3.2*  CL 108 109 105  CO2 19*  --  21*  GLUCOSE 97 95 132*  BUN 6 6 7   CREATININE 0.66 0.50 0.63  CALCIUM 9.2  --  8.8*   LFT No results for input(s): PROT, ALBUMIN, AST, ALT, ALKPHOS, BILITOT, BILIDIR, IBILI in the last  72 hours. PT/INR No results for input(s): LABPROT, INR in the last 72 hours. Hepatitis Panel No results for input(s): HEPBSAG, HCVAB, HEPAIGM, HEPBIGM in the last 72 hours.  Studies/Results: Dg Neck Soft Tissue  Result Date: 05/08/2016 CLINICAL DATA:  Sore throat and neck pain after endoscopy today. Pain with swallowing. EXAM: NECK SOFT TISSUES - 1+ VIEW COMPARISON:  None. FINDINGS: There is irregular gas tracking in prevertebral soft tissues seen on the lateral view only. The epiglottis and aryepiglottic folds appear normal. No bony abnormality is seen. IMPRESSION: Prevertebral soft tissue gas is seen on the lateral view only worrisome for pneumomediastinum secondary to esophageal injury. CT neck and chest with IV contrast recommended for further evaluation. One swallow of water soluble contrast immediately prior to scanning is also recommended. These results were called by telephone at the time of interpretation on 05/08/2016 at 4:38 pm to , who verbally acknowledged these results. Electronically Signed   By: Drusilla Kanner M.D.   On: 05/08/2016 16:43   X-ray Chest Pa And Lateral  Result Date: 05/09/2016 CLINICAL DATA:  Chest pain. EXAM: CHEST  2 VIEW COMPARISON:  05/08/2016 FINDINGS: The cardiomediastinal silhouette is within normal limits. The lungs are slightly  less well inflated than on the prior study with mild interstitial accentuation in the lung bases. No segmental airspace consolidation, overt pulmonary edema, pleural effusion, or pneumothorax is identified. No acute osseous abnormality is seen. IMPRESSION: Minimal bibasilar atelectasis. Electronically Signed   By: Sebastian Ache M.D.   On: 05/09/2016 09:40   Dg Chest 2 View  Result Date: 05/08/2016 CLINICAL DATA:  Post endoscopy.  Sore throat.  Pain swallowing. EXAM: CHEST  2 VIEW COMPARISON:  11/18/2013. FINDINGS: The heart size and mediastinal contours are within normal limits. Both lungs are clear. The visualized skeletal structures are  unremarkable. IMPRESSION: No active cardiopulmonary disease. Electronically Signed   By: Elsie Stain M.D.   On: 05/08/2016 16:29   Ct Soft Tissue Neck W Contrast  Result Date: 05/08/2016 CLINICAL DATA:  Esophageal perforation EXAM: CT NECK WITH CONTRAST TECHNIQUE: Multidetector CT imaging of the neck was performed using the standard protocol following the bolus administration of intravenous contrast. CONTRAST:  75mL ISOVUE-300 IOPAMIDOL (ISOVUE-300) INJECTION 61% COMPARISON:  Soft tissue neck radiograph 05/08/2016 FINDINGS: Pharynx and larynx: There is free air within the prevertebral space suggesting perforation of the esophagus. The patient was not able to tolerate oral contrast. No clear esophageal defect is identified. The nasopharynx, oropharynx and hypopharynx are normal. No laryngeal abnormality. Salivary glands: The parotid and submandibular glands are normal. No sialolithiasis or salivary ductal dilatation. Thyroid: Normal Lymph nodes: No pathologically enlarged or abnormal density cervical lymph nodes. Just posterior to the left angle of the mandible, there is a thin-walled cystic structure measuring 1.7 x 1.9 cm, just inferior to the tail of the parotid gland. Vascular: Normal Limited intracranial: Normal Visualized orbits: Normal Mastoids and visualized paranasal sinuses: Small amount of fluid within the right maxillary sinus. Mastoids are clear. Skeleton: Normal. Upper chest: Please see dedicated report for concomitant chest CT. Other: None IMPRESSION: 1. Free air within the retropharyngeal/prevertebral space, adjacent to the thoracic course the esophagus and consistent with esophageal perforation. No focal esophageal defect is identified. These results were called by telephone by Dr. Davonna Belling at the time of interpretation on 05/08/2016 at 6:50 pm. Please see dedicated chest CT report for further details. 2. 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 No surrounding inflammatory changes. Electronically Signed   By: Deatra Robinson M.D.   On: 05/08/2016 18:52   Ct Chest W Contrast  Result Date: 05/08/2016 CLINICAL DATA:  Patient status post EGD earlier today.  Severe pain. EXAM: CT CHEST WITH CONTRAST TECHNIQUE: Multidetector CT imaging of the chest was performed during intravenous contrast administration. CONTRAST:  75mL ISOVUE-300 IOPAMIDOL (ISOVUE-300) INJECTION 61% COMPARISON:  Chest radiograph earlier today. CT neck reported separately. FINDINGS: Cardiovascular: No significant vascular findings. Normal heart size. No pericardial effusion. Mediastinum/Nodes: Extraluminal air is seen in the superior mediastinum, multiple locations, along the course of the esophagus. No lymph nodal enlargement. Lungs/Pleura: No pneumothorax. No pulmonary nodules. Scattered air cysts. No effusion. Upper Abdomen: Extraluminal air, to the LEFT of the gastroesophageal junction. No contrast extravasation. Musculoskeletal: Unremarkable. IMPRESSION: Extraluminal air is seen in the superior mediastinum, multiple locations, along the course of the esophagus, as well as in the upper abdomen to the LEFT of the GE junction. No contrast extravasation, but the suspected tear is favored to be in the distal esophageal/GE junction region, given the intra-abdominal air. Critical Value/emergent results were called by telephone at the  time of interpretation on 05/08/2016 at 6:54 pm to NICOLE NADEAU , who verbally acknowledged these results. We also discussed the results of the CT neck. Electronically Signed   By: Elsie StainJohn T Curnes M.D.   On: 05/08/2016 18:55   Dg Esophagus W/water Sol Cm  Result Date: 05/08/2016 CLINICAL DATA:  Neck pain and significant dysphagia and odynophagia following upper endoscopy and esophageal dilatation today. Mediastinal air on a chest CT today. EXAM: ESOPHOGRAM/BARIUM SWALLOW  TECHNIQUE: Single contrast examination was performed using thin barium or water soluble. FLUOROSCOPY TIME:  Fluoroscopy Time:  2 minutes 6 seconds Radiation Exposure Index (if provided by the fluoroscopic device): Not applicable Number of Acquired Spot Images: 3 spot exposures and 13 screen saved images COMPARISON:  Chest CT obtained earlier today. FINDINGS: The patient swallowed Gastrografin without difficulty other than nausea and vomiting at the end of the examination. Normal esophageal motility in the upright position. No contrast extravasation was demonstrated from the hypopharynx to the stomach. The esophagus has a normal appearance. IMPRESSION: Normal appearing esophagus with no contrast extravasation seen. Electronically Signed   By: Beckie SaltsSteven  Reid M.D.   On: 05/08/2016 20:43   Scheduled Meds: . dolutegravir  50 mg Oral Daily  . enoxaparin (LOVENOX) injection  40 mg Subcutaneous Q24H  . lamiVUDine  300 mg Oral QHS  . pantoprazole (PROTONIX) IV  40 mg Intravenous Q24H  . rilpivirine  25 mg Oral Q breakfast  . sodium chloride flush  3 mL Intravenous Q12H   Continuous Infusions: . dextrose 5 % and 0.45% NaCl 100 mL/hr at 05/09/16 0300  . piperacillin-tazobactam (ZOSYN)  IV 3.375 g (05/09/16 0648)   PRN Meds:.HYDROmorphone (DILAUDID) injection, LORazepam, [DISCONTINUED] ondansetron **OR** ondansetron (ZOFRAN) IV   ASSESMENT:   *  Esophageal perforation post EGD with incidental dilation of proximal esophageal ring during scope passage.  No  Hx of dysphagia and N/V.   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  Day 2 Zosyn, Protonix IV, NPO except sips with meds, IVF  *  Anxiety, ? seizure.  Family gives hx of same when pt having intense pain.   *  IBS.  Chronic lower abd pain, multiple unrevealing CT scans, ultrasound, HIDA since 2015.  5/8 colonoscopy normal, unrevealing.  Complex left ovarian cyst, s/p lap left salpingooophorectomy 01/2014.  Chronic pyelonephritis, left hydronephosis, s/p  cyst/ureteroscopy with stent 2015  *  Hypokalemia.    *  HIV.  On meds. 03/19/16 CD4 count 120.    *  Chronic leukopenia.    PLAN   *  Dr Donata ClayVan Trigt plans to keep in ICU today.  Defer all decisions as to re imaging and allowing po to him.    *  Benadryl 12.5 mg IV q 6 PRN for itching.   May need to switch to a different anaelgesic.   Switched to D5 0.45 % NaCL with 30 KCl at same 100 ml/hour rate.  BMET in AM.    Jennye MoccasinSarah Gribbin  05/09/2016, 10:29 AM Pager: [phone]  GI ATTENDING  Interval history and data reviewed. Patient personally seen and examined. Her fianc and son in room. Agree with interval progress note as outlined above Pharyngeal discomfort improving. Did have some lower abdominal cramping discomfort which has resolved. Otherwise stable. I did discuss her case with Dr. Morton PetersVan Tright. He will advance her diet and plan follow-up imaging. We appreciate his help. Obviously, nothing new from GI perspective. However, we are available while she is in-house needed. Outpatient GI follow-up with Dr. Christella HartiganJacobs as needed  for her chronic GI complaints. Thank you.  Wilhemina Bonito. Eda Keys., M.D. [facility] Division of Gastroenterology

## 2016-05-09 NOTE — Significant Event (Signed)
Rapid Response Event Note While seeing another pt across hall pt had a seizure Overview: Time Called: 0030 Arrival Time: 0030 Event Type: Neurologic  Initial Focused Assessment: On arrival pt sitting in chair on her Left side with assistance of spouse. Cool rag to her face, pt turned her head toward me when name called, pt with all over tremors. Assisted to bed, placed on Left side, suction set up but not needed. After all over tremors passed pt began looking around the room confused and breathing rapidly lasting less than a minute. Dr. Mervin HackVan Trite paged, new orders given for ativan 2 mg IVP push and monitor VS every 30 mins and given Ativan 1 mg every hour as needed for anxiety. Per spouse pt has seizures when she has increase pain. Pt had just called out for abdominal pain 10/10 receiving Fentanyl 25 mcg 2320. Pt alert and oriented x4 when transported to 2h25, following commands answering all questions appropriately  BP 100/52, HR 73, RR 17, 97% RA  Interventions: Ativan 2 mg IVP given transferred to 2H25    Event Summary: Name of Physician Notified: Dr. Mervin HackVan Trite  at 0032    at    Outcome: Transferred (Comment) (2H25)     Phillips GroutSHULAR, Sandi CarneLESLIE Paige

## 2016-05-09 NOTE — H&P (Signed)
[address]       [place] [id number]             [phone]        Volney PresserSharon D Roemer [facility] Medical Record #[id number] Date of Birth: 10/21/1980  Referring: No ref. provider found Primary Care: Beaulah DinningGambino, Christina M, MD  Chief Complaint:    Chief Complaint  Patient presents with  . Airway Obstruction    Possible esophageal perforation, sent from GI clinic, had EGD completed today   Patient examined in the emergency department, chest x-ray, CT scan of chest and subsequently Gastrografin swallow esophogram personally reviewed and discussed with radiologist   l History of Present Illness:     36 year old female HIV positive treated at the ID clinic with chronic leukopenia and chronic abdominal pain. She underwent upper and lower endoscopy on May 8. At the end of the upper endoscopy procedure [ at which time  resistance was encountered to passage of the scope by a possible esophageal web but was otherwise unremarkable ] the patient complained of neck and chest pain. Neck x-ray showed prevertebral air from possible esophageal perforation and she was sent to the ED. She was having chest pain but was stable without fever or tachycardia. CT scan of chest showed scattered areas of small amount of mediastinal air, no contrast was swallowed because of odynophagia, no pleural effusions were noted. Thoracic surgical evaluation was requested for possible esophageal perforation. A Gastrografin swallow study was then performed under fluoroscopy in the ED which showed no contrast leak from the esophagus.  I evaluated the patient and reviewed these images and felt that she had had a microperforation into the mediastinum without free leak and planned to admit her for IV antibiotics, IV fluids, nothing by mouth, and pain control.   Current Activity/ Functional Status: The patient was functional but had problems with marijuana use and hyperemesis syndrome    Zubrod Score: At the  time of surgery this patient's most appropriate activity status/level should be described as: []     0    Normal activity, no symptoms [x]     1    Restricted in physical strenuous activity but ambulatory, able to do out light work []     2    Ambulatory and capable of self care, unable to do work activities, up and about                 more than 50%  Of the time                            []     3    Only limited self care, in bed greater than 50% of waking hours []     4    Completely disabled, no self care, confined to bed or chair []     5    Moribund  Past Medical History:  Diagnosis Date  . Cyst of ovary    PT STATES SHE IS TAKING ANTIBIOTICS FOR CYST ON OVARY  . HIV (human immunodeficiency virus infection) (HCC)   . Hydronephrosis, left   . Pyelonephritis   . Recurrent boils   . Seasonal allergies   . Seizures (HCC) 11/2015   encouraged further eval, not followed  . Trichomonas infection   . Urinary frequency    and nocturia    Past Surgical History:  Procedure Laterality Date  . CESAREAN SECTION  2006  .  CYSTOSCOPY WITH RETROGRADE PYELOGRAM, URETEROSCOPY AND STENT PLACEMENT Bilateral 10/28/2013   Procedure: CYSTOSCOPY WITH RETROGRADE PYELOGRAM, CYSTOGRAM;  Surgeon: Valetta Fuller, MD;  Location: [facility];  Service: Urology;  Laterality: Bilateral;  . LAPAROSCOPIC UNILATERAL SALPINGO OOPHERECTOMY Left 01/05/2014   Procedure: LAPAROSCOPIC LEFT SALPINGO OOPHORECTOMY;  Surgeon: Catalina Antigua, MD;  Location: [facility];  Service: Gynecology;  Laterality: Left;    History  Smoking Status  . Current Every Day Smoker  . Packs/day: 0.10  . Years: 14.00  . Types: Cigarettes, Cigars  Smokeless Tobacco  . Never Used    Comment: smoking 1-2 black and milds per day    History  Alcohol Use No    Comment:  - does smoke tobacco product called blackmilds    Social History   Social History  . Marital status: Single    Spouse name: N/A  . Number of children: 1  . Years of education: N/A    Occupational History  . Not on file.   Social History Main Topics  . Smoking status: Current Every Day Smoker    Packs/day: 0.10    Years: 14.00    Types: Cigarettes, Cigars  . Smokeless tobacco: Never Used     Comment: smoking 1-2 black and milds per day  . Alcohol use No     Comment:  - does smoke tobacco product called blackmilds  . Drug use: Yes    Frequency: 7.0 times per week    Types: Marijuana     Comment: 2-3 joints/daily- 12/30/13- pt advised not to smoke marijuana night before or am of surgery  . Sexual activity: Yes    Partners: Male    Birth control/ protection: Condom   Other Topics Concern  . Not on file   Social History Narrative  . No narrative on file    Allergies  Allergen Reactions  . Ibuprofen Rash and Other (See Comments)    "Irritates my bladder"  . Latex Other (See Comments)    If pt wears latex gloves or any contact with latex causes itching and rash. Some tapes also cause itching, skin irritation - paper tape seems to be okay  . Sulfamethoxazole-Trimethoprim Other (See Comments)    Rash, chills and hives  . Tylenol [Acetaminophen] Other (See Comments)    Makes me pee more- irritates bladder    Current Facility-Administered Medications  Medication Dose Route Frequency Provider Last Rate Last Dose  . dapsone tablet 100 mg  100 mg Oral QHS Donata Clay, Theron Arista, MD      . dextrose 5 % and 0.45 % NaCl with KCl 30 mEq/L infusion   Intravenous Continuous Dianah Field, PA-C 100 mL/hr at 05/09/16 1208    . diphenhydrAMINE (BENADRYL) injection 12.5 mg  12.5 mg Intravenous Q6H PRN Dianah Field, PA-C   12.5 mg at 05/09/16 1120  . dolutegravir (TIVICAY) tablet 50 mg  50 mg Oral QHS Donata Clay, Theron Arista, MD      . enoxaparin (LOVENOX) injection 40 mg  40 mg Subcutaneous Q24H Donata Clay, Theron Arista, MD   40 mg at 05/09/16 0815  . HYDROmorphone (DILAUDID) injection 1 mg  1 mg Intravenous Q2H PRN Kerin Perna, MD   1 mg at 05/09/16 1741  . lamiVUDine (EPIVIR)  10 MG/ML solution 300 mg  300 mg Oral QHS Donata Clay, Theron Arista, MD   300 mg at 05/08/16 2217  . LORazepam (ATIVAN) injection 1 mg  1 mg Intravenous Q4H PRN Kerin Perna, MD      .  ondansetron (ZOFRAN) injection 4 mg  4 mg Intravenous Q6H PRN Kerin Perna, MD   4 mg at 05/09/16 1120  . pantoprazole (PROTONIX) injection 40 mg  40 mg Intravenous Q24H Donata Clay, Theron Arista, MD   40 mg at 05/09/16 0815  . piperacillin-tazobactam (ZOSYN) IVPB 3.375 g  3.375 g Intravenous Q8H Kerin Perna, MD   Stopped at 05/09/16 1608  . rilpivirine (EDURANT) tablet 25 mg  25 mg Oral QHS Donata Clay, Theron Arista, MD      . sodium chloride flush (NS) 0.9 % injection 3 mL  3 mL Intravenous Q12H Donata Clay, Theron Arista, MD   3 mL at 05/09/16 1000    Facility-Administered Medications Prior to Admission  Medication Dose Route Frequency Provider Last Rate Last Dose  . 0.9 %  sodium chloride infusion  500 mL Intravenous Continuous Rachael Fee, MD       Prescriptions Prior to Admission  Medication Sig Dispense Refill Last Dose  . dicyclomine (BENTYL) 20 MG tablet Take 1 tablet (20 mg total) by mouth 2 (two) times daily. 60 tablet 0 Past Month at Unknown time  . EPIVIR 10 MG/ML solution TAKE 30 MILLILITERS (2 TABLESPOONFULS) BY MOUTH ONCE DAILY 960 mL 2 05/08/2016 at Unknown time  . norgestimate-ethinyl estradiol (ORTHO-CYCLEN,SPRINTEC,PREVIFEM) 0.25-35 MG-MCG tablet Take 1 tablet by mouth daily. 1 Package 11 05/08/2016 at Unknown time  . TIVICAY 50 MG tablet TAKE ONE (1) TABLET BY MOUTH EVERY DAY 30 tablet 2 05/08/2016 at Unknown time  . EDURANT 25 MG TABS tablet TAKE ONE TABLET BY MOUTH EVERY MORNING WITH BREAKFAST 30 tablet 2 not taking  . ondansetron (ZOFRAN ODT) 4 MG disintegrating tablet Take 1 tablet (4 mg total) by mouth every 8 (eight) hours as needed for nausea or vomiting. (Patient not taking: Reported on 05/08/2016) 10 tablet 0 Not Taking at Unknown time    Family History  Problem Relation Age of Onset  . Adopted: Yes  . HIV  Mother      Review of Systems:       Cardiac Review of Systems: Y or N  Chest Pain [ y   ]  Resting SOB Cove.Etienne   ] Exertional SOB  [n  ]  Pollyann Kennedy Milo.Brash  ]   Pedal Edema [  n ]    Palpitations Milo.Brash  ] Syncope  [ n ]   Presyncope [ n  ]  General Review of Systems: [Y] = yes [  ]=no Constitional: recent weight change [  ]; anorexia [  ]; fatigue [  ]; nausea [ y ]; night sweats [  ]; fever [  ]; or chills [  ]                                                               Dental: poor dentition[  ]; Last Dentist visit unknown   Eye : blurred vision [  ]; diplopia [   ]; vision changes [  ];  Amaurosis fugax[  ]; Resp: cough [  ];  wheezing[  ];  hemoptysis[  ]; shortness of breath[y  ]; paroxysmal nocturnal dyspnea[  ]; dyspnea on exertion[  ]; or orthopnea[  ];  GI:  gallstones[  ], vomiting[ y ];  dysphagia[y  ]; melena[  ];  hematochezia [  ]; heartburn[  y];   Hx of  Colonoscopy[ y ]; GU: kidney stones [  ]; hematuria[  ];   dysuria [ y ];  nocturia[  ];  history of     obstruction [  ]; urinary frequency [  ]enuresis             Skin: rash, swelling[  ];, hair loss[  ];  peripheral edema[  ];  or itching[  ]; Musculosketetal: myalgias[  ];  joint swelling[  ];  joint erythema[  ];  joint pain[  ];  back pain[ y ];  Heme/Lymph: bruising[  ];  bleeding[  ];  anemia[  ];  Neuro: TIA[  ];  headaches[ y ];  stroke[  ];  vertigo[  ];  seizures[  ];   paresthesias[  ];  difficulty walking[  ];  Psych:depression[y  ]; anxiety[  ];  Endocrine: diabetes[  ];  thyroid dysfunction[  ];  Immunizations: Flu [  ]; Pneumococcal[  ];  Other:  Physical Exam: BP 111/84   Pulse 77   Temp 97.9 F (36.6 C) (Oral)   Resp 18   Ht 5\' 1"  (1.549 m)   Wt 166 lb 7.2 oz (75.5 kg)   LMP 04/09/2016   SpO2 97%   BMI 31.45 kg/m        Physical Exam  General: Alert anxious young AA female in distress with stable vital signs, normal heart rate HEENT: Normocephalic pupils equal , dentition adequate Neck: Supple  without JVD, adenopathy, or bruit. No crepitus Chest: Clear to auscultation, symmetrical breath sounds, no rhonchi, no tenderness             or deformity Cardiovascular: Regular rate and rhythm, no murmur, no gallop, peripheral pulses             palpable in all extremities Abdomen:  Soft, nontender, no palpable mass or organomegaly Extremities: Warm, well-perfused, no clubbing cyanosis edema or tenderness,              no venous stasis changes of the legs Rectal/GU: Deferred Neuro: Grossly non--focal and symmetrical throughout Skin: Clean and dry without rash or ulceration   Diagnostic Studies & Laboratory data:     Recent Radiology Findings:   Dg Neck Soft Tissue  Result Date: 05/08/2016 CLINICAL DATA:  Sore throat and neck pain after endoscopy today. Pain with swallowing. EXAM: NECK SOFT TISSUES - 1+ VIEW COMPARISON:  None. FINDINGS: There is irregular gas tracking in prevertebral soft tissues seen on the lateral view only. The epiglottis and aryepiglottic folds appear normal. No bony abnormality is seen. IMPRESSION: Prevertebral soft tissue gas is seen on the lateral view only worrisome for pneumomediastinum secondary to esophageal injury. CT neck and chest with IV contrast recommended for further evaluation. One swallow of water soluble contrast immediately prior to scanning is also recommended. These results were called by telephone at the time of interpretation on 05/08/2016 at 4:38 pm to , who verbally acknowledged these results. Electronically Signed   By: Drusilla Kanner M.D.   On: 05/08/2016 16:43   X-ray Chest Pa And Lateral  Result Date: 05/09/2016 CLINICAL DATA:  Chest pain. EXAM: CHEST  2 VIEW COMPARISON:  05/08/2016 FINDINGS: The cardiomediastinal silhouette is within normal limits. The lungs are slightly less well inflated than on the prior study with mild interstitial accentuation in the lung bases. No segmental airspace consolidation, overt pulmonary edema, pleural effusion, or  pneumothorax is identified. No acute osseous  abnormality is seen. IMPRESSION: Minimal bibasilar atelectasis. Electronically Signed   By: Sebastian Ache M.D.   On: 05/09/2016 09:40   Dg Chest 2 View  Result Date: 05/08/2016 CLINICAL DATA:  Post endoscopy.  Sore throat.  Pain swallowing. EXAM: CHEST  2 VIEW COMPARISON:  11/18/2013. FINDINGS: The heart size and mediastinal contours are within normal limits. Both lungs are clear. The visualized skeletal structures are unremarkable. IMPRESSION: No active cardiopulmonary disease. Electronically Signed   By: Elsie Stain M.D.   On: 05/08/2016 16:29   Ct Soft Tissue Neck W Contrast  Result Date: 05/08/2016 CLINICAL DATA:  Esophageal perforation EXAM: CT NECK WITH CONTRAST TECHNIQUE: Multidetector CT imaging of the neck was performed using the standard protocol following the bolus administration of intravenous contrast. CONTRAST:  75mL ISOVUE-300 IOPAMIDOL (ISOVUE-300) INJECTION 61% COMPARISON:  Soft tissue neck radiograph 05/08/2016 FINDINGS: Pharynx and larynx: There is free air within the prevertebral space suggesting perforation of the esophagus. The patient was not able to tolerate oral contrast. No clear esophageal defect is identified. The nasopharynx, oropharynx and hypopharynx are normal. No laryngeal abnormality. Salivary glands: The parotid and submandibular glands are normal. No sialolithiasis or salivary ductal dilatation. Thyroid: Normal Lymph nodes: No pathologically enlarged or abnormal density cervical lymph nodes. Just posterior to the left angle of the mandible, there is a thin-walled cystic structure measuring 1.7 x 1.9 cm, just inferior to the tail of the parotid gland. Vascular: Normal Limited intracranial: Normal Visualized orbits: Normal Mastoids and visualized paranasal sinuses: Small amount of fluid within the right maxillary sinus. Mastoids are clear. Skeleton: Normal. Upper chest: Please see dedicated report for concomitant chest CT. Other:  None IMPRESSION: 1. Free air within the retropharyngeal/prevertebral space, adjacent to the thoracic course the esophagus and consistent with esophageal perforation. No focal esophageal defect is identified. These results were called by telephone by Dr. Davonna Belling at the time of interpretation on 05/08/2016 at 6:50 pm. Please see dedicated chest CT report for further details. 2. 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 No surrounding inflammatory changes. Electronically Signed   By: Deatra Robinson M.D.   On: 05/08/2016 18:52   Ct Chest W Contrast  Result Date: 05/08/2016 CLINICAL DATA:  Patient status post EGD earlier today.  Severe pain. EXAM: CT CHEST WITH CONTRAST TECHNIQUE: Multidetector CT imaging of the chest was performed during intravenous contrast administration. CONTRAST:  75mL ISOVUE-300 IOPAMIDOL (ISOVUE-300) INJECTION 61% COMPARISON:  Chest radiograph earlier today. CT neck reported separately. FINDINGS: Cardiovascular: No significant vascular findings. Normal heart size. No pericardial effusion. Mediastinum/Nodes: Extraluminal air is seen in the superior mediastinum, multiple locations, along the course of the esophagus. No lymph nodal enlargement. Lungs/Pleura: No pneumothorax. No pulmonary nodules. Scattered air cysts. No effusion. Upper Abdomen: Extraluminal air, to the LEFT of the gastroesophageal junction. No contrast extravasation. Musculoskeletal: Unremarkable. IMPRESSION: Extraluminal air is seen in the superior mediastinum, multiple locations, along the course of the esophagus, as well as in the upper abdomen to the LEFT of the GE junction. No contrast extravasation, but the suspected tear is favored to be in the distal esophageal/GE junction region, given the intra-abdominal air. Critical Value/emergent results were called by telephone at the time of  interpretation on 05/08/2016 at 6:54 pm to NICOLE NADEAU , who verbally acknowledged these results. We also discussed the results of the CT neck. Electronically Signed   By: Jonny Ruiz  Abelino Derrick M.D.   On: 05/08/2016 18:55   Dg Esophagus W/water Sol Cm  Result Date: 05/08/2016 CLINICAL DATA:  Neck pain and significant dysphagia and odynophagia following upper endoscopy and esophageal dilatation today. Mediastinal air on a chest CT today. EXAM: ESOPHOGRAM/BARIUM SWALLOW TECHNIQUE: Single contrast examination was performed using thin barium or water soluble. FLUOROSCOPY TIME:  Fluoroscopy Time:  2 minutes 6 seconds Radiation Exposure Index (if provided by the fluoroscopic device): Not applicable Number of Acquired Spot Images: 3 spot exposures and 13 screen saved images COMPARISON:  Chest CT obtained earlier today. FINDINGS: The patient swallowed Gastrografin without difficulty other than nausea and vomiting at the end of the examination. Normal esophageal motility in the upright position. No contrast extravasation was demonstrated from the hypopharynx to the stomach. The esophagus has a normal appearance. IMPRESSION: Normal appearing esophagus with no contrast extravasation seen. Electronically Signed   By: Beckie Salts M.D.   On: 05/08/2016 20:43     I have independently reviewed the above radiologic studies.  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  This appears to be contained and there is no evidence of mediastinitis.  The patient will be admitted to the hospital for broad coverage IV antibiotics, IV fluids, nothing by mouth and observation. We will start IV proton pump inhibitor therapy. She'll be given IV pain medication. We will start her antiviral medication ,which is oral, as soon as safely possible.       @ME1 @ 05/09/2016 6:01 PM

## 2016-05-09 NOTE — Care Management Note (Signed)
Case Management Note Donn PieriniKristi Kerisha Goughnour RN, BSN Unit 2W-Case Manager-- 2H coverage [phone]  Patient Details  Name: Kara PresserSharon D Perlstein MRN: [id number] Date of Birth: 10/16/1980  Subjective/Objective:  Pt admitted with esophageal perforation following EGD                  Action/Plan: PTA pt lived at home- independent- CM to follow for d/c needs  Expected Discharge Date:                  Expected Discharge Plan:  Home/Self Care  In-House Referral:     Discharge planning Services  CM Consult  Post Acute Care Choice:    Choice offered to:     DME Arranged:    DME Agency:     HH Arranged:    HH Agency:     Status of Service:  In process, will continue to follow  If discussed at Long Length of Stay Meetings, dates discussed:    Discharge Disposition:   Additional Comments:  Darrold SpanWebster, Zelia Yzaguirre Hall, RN 05/09/2016, 12:00 PM

## 2016-05-09 NOTE — Progress Notes (Signed)
Patient educated on the importance of calling out before standing up and going to the bathroom because of possible risks for falls. RN also educated patient about needing to leave her leads on so that we can properly monitor her while she is on the unit.   She continues to be non-compliant and keeps getting out of bed without notifying RN. She also takes off leads, O2 probe, and blood pressure cuff without calling for help.   I also spoke with the spouse in the room about calling out for help if he sees patient getting out of bed so that she doesn't fall.   Will continue to monitor patient and continue to educate about risks for injury.   Lynsee Wands E Mylinda LatinaNino Combs, [place]RN

## 2016-05-09 NOTE — Progress Notes (Signed)
Pt started having abdominal pain of 10 out of 10. Decided to give pt a dose of fentanyl an hour early.  Upon arriving in the room, pt was slouched in a chair with husband holding her up.  Pt was limp, moaning and unresponsive. Husband explained she has seizures when pain becomes uncontrollable.  Fentanyl was quickly given and rapid response nurse was informed and arrived. Pt was carefully moved back to the bed and started shaking uncontrollably.  Suction and bed padding was set up.  MD on call was informed and ordered Ativan 2 mg IV now and Ativan 1 mg IV every hours as needed for anxiety. Also, ordered to check vital signs every 30 minutes for two hours and to transfer the pt to 2 Heart.  Orders placed.  Will continue to monitor pt.  Harriet Massonavidson, Deaunna Olarte E, RN

## 2016-05-09 NOTE — Progress Notes (Signed)
upper esophageal perf  From EGD- dilatation of stricture 5-8 Subjective: Still having emesis but odynophagia much better CXR pending No crepitus in neck, chest Objective: Vital signs in last 24 hours: Temp:  [97.3 F (36.3 C)-99.2 F (37.3 C)] 97.3 F (36.3 C) (05/09 0808) Pulse Rate:  [57-118] 71 (05/09 0800) Cardiac Rhythm: Normal sinus rhythm (05/09 0800) Resp:  [12-24] 18 (05/09 0800) BP: (99-133)/(52-89) 110/79 (05/09 0800) SpO2:  [97 %-100 %] 100 % (05/09 0800) Weight:  [166 lb 7.2 oz (75.5 kg)-171 lb (77.6 kg)] 166 lb 7.2 oz (75.5 kg) (05/09 0500)  Hemodynamic parameters for last 24 hours:  stable  Intake/Output from previous day: 05/08 0701 - 05/09 0700 In: 821.7 [I.V.:771.7; IV Piggyback:50] Out: -  Intake/Output this shift: Total I/O In: 50 [IV Piggyback:50] Out: -        Exam    General- alert and comfortable   Lungs- clear without rales, wheezes   Cor- regular rate and rhythm, no murmur , gallop   Abdomen- soft, non-tender   Extremities - warm, non-tender, minimal edema   Neuro- oriented, appropriate, no focal weakness   Lab Results:  Recent Labs  05/08/16 1732 05/08/16 1747 05/09/16 0559  WBC 4.9  --  4.6  HGB 12.5 13.3 11.5*  HCT 37.5 39.0 34.5*  PLT 216  --  192   BMET:  Recent Labs  05/08/16 1732 05/08/16 1747 05/09/16 0559  NA 136 140 135  K 3.6 3.6 3.2*  CL 108 109 105  CO2 19*  --  21*  GLUCOSE 97 95 132*  BUN 6 6 7   CREATININE 0.66 0.50 0.63  CALCIUM 9.2  --  8.8*    PT/INR: No results for input(s): LABPROT, INR in the last 72 hours. ABG    Component Value Date/Time   TCO2 20 05/08/2016 1747   CBG (last 3)  No results for input(s): GLUCAP in the last 72 hours.  Assessment/Plan: S/P  Cont npo except sips w/ meds , iVF and Zosyn, protonix Keep in ICU todat  LOS: 1 day    Kathlee Nationseter Van Trigt III 05/09/2016

## 2016-05-10 ENCOUNTER — Other Ambulatory Visit: Payer: Self-pay | Admitting: Internal Medicine

## 2016-05-10 ENCOUNTER — Inpatient Hospital Stay (HOSPITAL_COMMUNITY): Payer: Medicaid Other

## 2016-05-10 DIAGNOSIS — K223 Perforation of esophagus: Secondary | ICD-10-CM

## 2016-05-10 LAB — BASIC METABOLIC PANEL
Anion gap: 6 (ref 5–15)
BUN: <5 mg/dL — ABNORMAL LOW (ref 6–20)
CO2: 20 mmol/L — ABNORMAL LOW (ref 22–32)
Calcium: 8.7 mg/dL — ABNORMAL LOW (ref 8.9–10.3)
Chloride: 107 mmol/L (ref 101–111)
Creatinine, Ser: 0.68 mg/dL (ref 0.44–1.00)
GFR calc Af Amer: >60 mL/min (ref >60)
GFR calc non Af Amer: >60 mL/min (ref >60)
Glucose, Bld: 111 mg/dL — ABNORMAL HIGH (ref 65–99)
Potassium: 3.5 mmol/L (ref 3.5–5.1)
Sodium: 133 mmol/L — ABNORMAL LOW (ref 135–145)

## 2016-05-10 LAB — CBC
HCT: 32.9 % — ABNORMAL LOW (ref 36.0–46.0)
Hemoglobin: 10.9 g/dL — ABNORMAL LOW (ref 12.0–15.0)
MCH: 30.4 pg (ref 26.0–34.0)
MCHC: 33.1 g/dL (ref 30.0–36.0)
MCV: 91.9 fL (ref 78.0–100.0)
Platelets: 173 10*3/uL (ref 150–400)
RBC: 3.58 MIL/uL — ABNORMAL LOW (ref 3.87–5.11)
RDW: 13.1 % (ref 11.5–15.5)
WBC: 4.2 10*3/uL (ref 4.0–10.5)

## 2016-05-10 MED ORDER — HYDROMORPHONE HCL 1 MG/ML IJ SOLN
1.0000 mg | INTRAMUSCULAR | Status: DC | PRN
  Administered 2016-05-10 – 2016-05-11 (×6): 1 mg via INTRAVENOUS
  Filled 2016-05-10 (×6): qty 1

## 2016-05-10 MED ORDER — WHITE PETROLATUM GEL
Status: AC
  Administered 2016-05-10: 17:00:00
  Filled 2016-05-10: qty 1

## 2016-05-10 NOTE — Progress Notes (Addendum)
2340 This RN was called into the room by pt partner stating " she's having a seizure" at 2340, pt was turned to the lateral recumbent position on her right side. Total non-responsive time 1min 15 seconds.  upon assessment pt: -HR was 88 -RR: 40, short shallow respirations, lungs clear bilaterally upon ascultation.   -eye exam: Perrl, 2mm. Looking straight forward,  -not responding to verbal commands.  -Bilateral upper extremities shaking mildly strength 2/5.  Once pt symptoms resolved she began to cry upon questioning if she remembered what happened. Pt states she remembers me shining a light in her eyes, and me talking but did not understand what i was saying. further neuro assessment post symptoms:  -AOx4,  -strength 5 in all extremities  -no ataxia or tremors visualized or felt.  -speech clear -affect: anxious and crying -vision: intact all 4 quadrants with no deficits  -resp: clear, RR:16 Pt states she was anxious and requesting antianxiety medication, but had to go to the bathroom first, pt educated on safety concerns related to falls with seizures, pt verbalized understanding but proceeded to bathroom despite education. Upon escort to and from the bathroom and back to be pt was administered 1mg  ativan IV and asking for PRN pain medication at 1230.    Pt ambulation to bathroom without assistance and removing monitoring devices, despite education to not remove monitoring equipment, or ambulate without assistance from nursing staff: -, 2000, 2100, 2200, 2300, 0000, 0100,

## 2016-05-10 NOTE — Progress Notes (Signed)
  Subjective: Swallowing much better- will advance diet to full liquids CXR remains clear Objective: Vital signs in last 24 hours: Temp:  [97.9 F (36.6 C)-98.7 F (37.1 C)] 98.6 F (37 C) (05/10 1604) Pulse Rate:  [64-104] 79 (05/10 1700) Cardiac Rhythm: Normal sinus rhythm (05/10 1700) Resp:  [11-26] 14 (05/10 1700) BP: (91-120)/(61-92) 112/82 (05/10 1700) SpO2:  [94 %-100 %] 100 % (05/10 1700)  Hemodynamic parameters for last 24 hours:  stable  Intake/Output from previous day: 05/09 0701 - 05/10 0700 In: 2036.7 [I.V.:1886.7; IV Piggyback:150] Out: -  Intake/Output this shift: Total I/O In: 1871.7 [P.O.:1200; I.V.:571.7; IV Piggyback:100] Out: -        Exam    General- alert and comfortable   Lungs- clear without rales, wheezes   Cor- regular rate and rhythm, no murmur , gallop   Abdomen- soft, non-tender   Extremities - warm, non-tender, minimal edema   Neuro- oriented, appropriate, no focal weakness   Lab Results:  Recent Labs  05/09/16 0559 05/10/16 0230  WBC 4.6 4.2  HGB 11.5* 10.9*  HCT 34.5* 32.9*  PLT 192 173   BMET:  Recent Labs  05/09/16 0559 05/10/16 0230  NA 135 133*  K 3.2* 3.5  CL 105 107  CO2 21* 20*  GLUCOSE 132* 111*  BUN 7 <5*  CREATININE 0.63 0.68  CALCIUM 8.8* 8.7*    PT/INR: No results for input(s): LABPROT, INR in the last 72 hours. ABG    Component Value Date/Time   TCO2 20 05/08/2016 1747   CBG (last 3)  No results for input(s): GLUCAP in the last 72 hours.  Assessment/Plan: S/P  Slowly advance diet Pain control Cont Zosyn and convert to Augmentin po tomorrow   LOS: 2 days    Kathlee Nationseter Van Trigt III 05/10/2016

## 2016-05-10 NOTE — Progress Notes (Signed)
Patient ID: [name], female   DOB: 03/12/1980, 36 y.o.   MRN: [id number] SICU Evening Rounds:  Hemodynamically stable  Afebrile sats 100% Taking clear liquids without problems

## 2016-05-11 ENCOUNTER — Inpatient Hospital Stay (HOSPITAL_COMMUNITY): Payer: Medicaid Other

## 2016-05-11 LAB — CBC
HCT: 34.8 % — ABNORMAL LOW (ref 36.0–46.0)
Hemoglobin: 11.7 g/dL — ABNORMAL LOW (ref 12.0–15.0)
MCH: 31.3 pg (ref 26.0–34.0)
MCHC: 33.6 g/dL (ref 30.0–36.0)
MCV: 93 fL (ref 78.0–100.0)
Platelets: 165 10*3/uL (ref 150–400)
RBC: 3.74 MIL/uL — ABNORMAL LOW (ref 3.87–5.11)
RDW: 13 % (ref 11.5–15.5)
WBC: 3 10*3/uL — ABNORMAL LOW (ref 4.0–10.5)

## 2016-05-11 LAB — BASIC METABOLIC PANEL
Anion gap: 9 (ref 5–15)
BUN: <5 mg/dL — ABNORMAL LOW (ref 6–20)
CO2: 20 mmol/L — ABNORMAL LOW (ref 22–32)
Calcium: 9.1 mg/dL (ref 8.9–10.3)
Chloride: 104 mmol/L (ref 101–111)
Creatinine, Ser: 0.76 mg/dL (ref 0.44–1.00)
GFR calc Af Amer: >60 mL/min (ref >60)
GFR calc non Af Amer: >60 mL/min (ref >60)
Glucose, Bld: 89 mg/dL (ref 65–99)
Potassium: 3.6 mmol/L (ref 3.5–5.1)
Sodium: 133 mmol/L — ABNORMAL LOW (ref 135–145)

## 2016-05-11 MED ORDER — PANTOPRAZOLE SODIUM 40 MG IV SOLR: 40.0000 mg | INTRAVENOUS | Status: DC

## 2016-05-11 MED ORDER — OXYCODONE HCL 5 MG PO TABS
5.0000 mg | ORAL_TABLET | ORAL | Status: DC | PRN
  Administered 2016-05-11 (×2): 5 mg via ORAL
  Filled 2016-05-11 (×2): qty 1

## 2016-05-11 MED ORDER — AMOXICILLIN-POT CLAVULANATE 875-125 MG PO TABS: 1.0000 | ORAL_TABLET | Freq: Two times a day (BID) | ORAL | 0 refills | Status: AC

## 2016-05-11 MED ORDER — LORAZEPAM 1 MG PO TABS: 1.0000 mg | ORAL_TABLET | ORAL | Status: DC | PRN

## 2016-05-11 NOTE — Care Management Note (Signed)
Case Management Note Donn PieriniKristi Yenty Bloch RN, BSN Unit 2W-Case Manager-- 2H coverage [phone]  Patient Details  Name: Kara Allen MRN: [id number] Date of Birth: 07/30/1980  Subjective/Objective:  Pt admitted with esophageal perforation following EGD                  Action/Plan: PTA pt lived at home- independent- CM to follow for d/c needs  Expected Discharge Date:  05/11/16               Expected Discharge Plan:  Home/Self Care  In-House Referral:     Discharge planning Services  CM Consult  Post Acute Care Choice:    Choice offered to:     DME Arranged:    DME Agency:     HH Arranged:    HH Agency:     Status of Service:  Completed, signed off  If discussed at Long Length of Stay Meetings, dates discussed:    Discharge Disposition: home/self care   Additional Comments:  Darrold SpanWebster, Dacoda Finlay Hall, RN 05/11/2016, 10:41 AM

## 2016-05-11 NOTE — Discharge Summary (Signed)
Physician Discharge Summary  Patient ID: Volney PresserSharon D Widen MRN: [id number] DOB/AGE: 34/05/1980 34 y.o.  Admit date: 05/08/2016 Discharge date: 05/11/2016  Admission Diagnoses:  Patient Active Problem List   Diagnosis Date Noted  . Esophageal perforation 05/08/2016  . Dysmenorrhea 03/28/2016  . Leukopenia   . Right upper quadrant abdominal pain   . Lower abdominal pain   . Gum inflammation 06/02/2014  . Pelvic pain in female   . 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 HIV (human immunodeficiency virus infection) (HCC) 10/01/2013  . Hydronephrosis, left 10/01/2013  . BOILS, RECURRENT 02/12/2008  . ANXIETY 01/30/2006  . CIGARETTE SMOKER 01/30/2006  . Depression 01/30/2006  . Mild cognitive impairment 01/30/2006  . ALLERGIC RHINITIS 01/30/2006  . SYMPTOM, ENURESIS, NOCTURNAL 01/30/2006   Discharge Diagnoses:   Patient Active Problem List   Diagnosis Date Noted  . Esophageal perforation 05/08/2016  . Dysmenorrhea 03/28/2016  . Leukopenia   . Right upper quadrant abdominal pain   . Lower abdominal pain   . Gum inflammation 06/02/2014  . Pelvic pain in female   . 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 HIV (human immunodeficiency virus infection) (HCC) 10/01/2013  . Hydronephrosis, left 10/01/2013  . BOILS, RECURRENT 02/12/2008  . ANXIETY 01/30/2006  . CIGARETTE SMOKER 01/30/2006  . Depression 01/30/2006  . Mild cognitive impairment 01/30/2006  . ALLERGIC RHINITIS 01/30/2006  . SYMPTOM, ENURESIS, NOCTURNAL 01/30/2006   Discharged Condition: good  History of Present Illness:  Kara Allen is a 36 yo female with history of HIV, chronic leukopenia, and chronic abdominal pain.  The patient underwent upper and lower endoscopy on May 8.  At the end of the upper endoscopy procedure the patient complained of chest and neck pain.  Neck xray was obtained and showed prevertebral air from a possible esophageal perforation.  Due to this she was sent to the ED for evaluation.  In the ED CT  scan of the chest was obtained and showed scattered areas of mediastinal air.  Due to this Thoracic surgery consult was requested.     Hospital Course:   The patient was admitted.  Gastrograffin swallow study was performed and showed no evidence of contrast leak from the esophagus.  However, Dr. Donata ClayVan Trigt reviewed the images and felt the patient likely had a Microperforation.  She was made NPO and started on IV ABX.  The patient's dysphagia improved.  Her diet was resumed to full liquids.  She tolerated this without difficulty. The patient suffered a seizure while attempting to get up and go to the bathroom.  She was stable post episode.  She was counseled on the importance of having nursing assist her to restroom.  The patient's diet was advanced to a soft diet on HD #2.  She tolerated this without difficulty.  She is ambulating without difficulty.  She will be placed on a course of Augmentin for the next 5 days.  She is medically stable for discharge home today per Dr. Donata ClayVan Trigt.       Significant Diagnostic Studies: radiology:   CT scan:  Extraluminal air is seen in the superior mediastinum, multiple locations, along the course of the esophagus, as well as in the upper abdomen to the LEFT of the GE junction. No contrast extravasation, but the suspected tear is favored to be in the distal esophageal/GE junction region, given the intra-abdominal air.  Critical Value/emergent results were called by telephone at the time of interpretation on 05/08/2016 at 6:54 pm to NICOLE NADEAU , who verbally acknowledged these results.  We also discussed the results of the CT neck.  Disposition: 01-Home or Self Care   Disposition: Home  Allergies as of 05/11/2016      Reactions   Ibuprofen Rash, Other (See Comments)   "Irritates my bladder"   Latex Other (See Comments)   If pt wears latex gloves or any contact with latex causes itching and rash. Some tapes also cause itching, skin irritation - paper tape  seems to be okay   Sulfamethoxazole-trimethoprim Other (See Comments)   Rash, chills and hives   Tylenol [acetaminophen] Other (See Comments)   Makes me pee more- irritates bladder      Medication List    TAKE these medications   amoxicillin-clavulanate 875-125 MG tablet Commonly known as:  AUGMENTIN Take 1 tablet by mouth 2 (two) times daily.   dicyclomine 20 MG tablet Commonly known as:  BENTYL Take 1 tablet (20 mg total) by mouth 2 (two) times daily.   EDURANT 25 MG Tabs tablet Generic drug:  rilpivirine TAKE ONE TABLET BY MOUTH EVERY MORNING WITH BREAKFAST   EPIVIR 10 MG/ML solution Generic drug:  lamiVUDine TAKE 30 MILLILITERS (2 TABLESPOONFULS) BY MOUTH ONCE DAILY   norgestimate-ethinyl estradiol 0.25-35 MG-MCG tablet Commonly known as:  ORTHO-CYCLEN,SPRINTEC,PREVIFEM Take 1 tablet by mouth daily.   ondansetron 4 MG disintegrating tablet Commonly known as:  ZOFRAN ODT Take 1 tablet (4 mg total) by mouth every 8 (eight) hours as needed for nausea or vomiting.   TIVICAY 50 MG tablet Generic drug:  dolutegravir TAKE ONE (1) TABLET BY MOUTH EVERY DAY      Follow-up Information    Kerin Perna, MD Follow up on 05/23/2016.   Specialty:  Cardiothoracic Surgery Why:  Appointment is at 9:30, please get CXR at 9:00 at [facility] located on first floor of our office building Contact information: [address] [place] [address] [phone]           Signed: Lowella Dandy 05/11/2016, 12:37 PM

## 2016-05-11 NOTE — Discharge Instructions (Signed)
Diet- as instructed by Dr. Donata ClayVan Trigt Activity- as tolerated  Contact [phone] if problems/questions arise

## 2016-05-11 NOTE — Progress Notes (Signed)
Pt unfortunately left before AVS could be reviewed or signed.  Pt was aware that she had a prescription to pick up at her pharmacy, and that it was important she do that and begin taking today.  Will leave AVS at desk for remainder of shift in case she calls with questions.

## 2016-05-11 NOTE — Progress Notes (Signed)
Transferred pt to 2w36 at this time.  Pt has no s/s of any acute distress.

## 2016-05-11 NOTE — Progress Notes (Signed)
  Subjective: Swallowing easier cxr clear No sign of mediastinitis Transfer to 2 west Objective: Vital signs in last 24 hours: Temp:  [98.1 F (36.7 C)-98.7 F (37.1 C)] 98.3 F (36.8 C) (05/11 0400) Pulse Rate:  [63-85] 63 (05/11 0400) Cardiac Rhythm: Normal sinus rhythm (05/11 0400) Resp:  [11-24] 17 (05/11 0644) BP: (91-142)/(68-119) 107/74 (05/11 0634) SpO2:  [78 %-100 %] 100 % (05/11 0400)  Hemodynamic parameters for last 24 hours:  stable  Intake/Output from previous day: 05/10 0701 - 05/11 0700 In: 2211.7 [P.O.:1440; I.V.:621.7; IV Piggyback:150] Out: -  Intake/Output this shift: No intake/output data recorded.       Exam    General- alert and comfortable   Lungs- clear without rales, wheezes   Cor- regular rate and rhythm, no murmur , gallop   Abdomen- soft, non-tender   Extremities - warm, non-tender, minimal edema   Neuro- oriented, appropriate, no focal weakness   Lab Results:  Recent Labs  05/10/16 0230 05/11/16 0255  WBC 4.2 3.0*  HGB 10.9* 11.7*  HCT 32.9* 34.8*  PLT 173 165   BMET:  Recent Labs  05/10/16 0230 05/11/16 0255  NA 133* 133*  K 3.5 3.6  CL 107 104  CO2 20* 20*  GLUCOSE 111* 89  BUN <5* <5*  CREATININE 0.68 0.76  CALCIUM 8.7* 9.1    PT/INR: No results for input(s): LABPROT, INR in the last 72 hours. ABG    Component Value Date/Time   TCO2 20 05/08/2016 1747   CBG (last 3)  No results for input(s): GLUCAP in the last 72 hours.  Assessment/Plan: S/P  Advance diet tx to 2 west   LOS: 3 days    Kathlee Nationseter Van Trigt III 05/11/2016

## 2016-05-23 ENCOUNTER — Ambulatory Visit: Payer: Medicaid Other | Admitting: Cardiothoracic Surgery

## 2016-05-24 ENCOUNTER — Other Ambulatory Visit: Payer: Self-pay | Admitting: Cardiothoracic Surgery

## 2016-05-24 DIAGNOSIS — K223 Perforation of esophagus: Secondary | ICD-10-CM

## 2016-05-25 ENCOUNTER — Encounter: Payer: Self-pay | Admitting: Cardiothoracic Surgery

## 2016-05-25 ENCOUNTER — Ambulatory Visit (INDEPENDENT_AMBULATORY_CARE_PROVIDER_SITE_OTHER): Payer: Medicaid Other | Admitting: Cardiothoracic Surgery

## 2016-05-25 ENCOUNTER — Ambulatory Visit
Admission: RE | Admit: 2016-05-25 | Discharge: 2016-05-25 | Disposition: A | Discharge status: Home / Self Care | Payer: Medicaid Other | Source: Ambulatory Visit | Attending: Cardiothoracic Surgery | Admitting: Cardiothoracic Surgery

## 2016-05-25 VITALS — BP 101/71 | HR 92 | Resp 16 | Ht 61.0 in | Wt 171.0 lb

## 2016-05-25 DIAGNOSIS — K223 Perforation of esophagus: Secondary | ICD-10-CM

## 2016-05-25 NOTE — Progress Notes (Signed)
PCP is Gambino, Valentina Shaggyhristina M, MD Referring Provider is Beaulah DinningGambino, Christina M, MD  Chief Complaint  Patient presents with  . Esophageal Perforation    hospital f/u with a CXR    HPI: The patient returns for post hospital check. The patient was hospitalized following upper endoscopy for dysphagia. The endoscopy resulted in a small tear of the cervical esophagus with pneumomediastinum. Contrast study showed no overt leak. She was admitted, placed on IV antibiotics and IV fluids and observed. Her symptoms of pain and odynophagia resolved. She was started on a liquid diet and then discharged with normal x-ray.  Since discharge home she is continuing to improve. She is noticed that her swallowing difficulties after her cervical esophagus was dilated with the scope have improved. She is on a solid diet. She denies odynophagia.  Today her chest x-rays clear without pneumomediastinum or pleural effusion, normal mediastinal silhouette.  Past Medical History:  Diagnosis Date  . Cyst of ovary    PT STATES SHE IS TAKING ANTIBIOTICS FOR CYST ON OVARY  . HIV (human immunodeficiency virus infection) (HCC)   . Hydronephrosis, left   . Pyelonephritis   . Recurrent boils   . Seasonal allergies   . Seizures (HCC) 11/2015   encouraged further eval, not followed  . Trichomonas infection   . Urinary frequency    and nocturia    Past Surgical History:  Procedure Laterality Date  . CESAREAN SECTION  2006  . CYSTOSCOPY WITH RETROGRADE PYELOGRAM, URETEROSCOPY AND STENT PLACEMENT Bilateral 10/28/2013   Procedure: CYSTOSCOPY WITH RETROGRADE PYELOGRAM, CYSTOGRAM;  Surgeon: Valetta Fulleravid S Grapey, MD;  Location: [facility];  Service: Urology;  Laterality: Bilateral;  . LAPAROSCOPIC UNILATERAL SALPINGO OOPHERECTOMY Left 01/05/2014   Procedure: LAPAROSCOPIC LEFT SALPINGO OOPHORECTOMY;  Surgeon: Catalina AntiguaPeggy Constant, MD;  Location: [facility];  Service: Gynecology;  Laterality: Left;    Family History  Problem Relation Age of Onset   . Adopted: Yes  . HIV Mother     Social History Social History  Substance Use Topics  . Smoking status: Current Every Day Smoker    Packs/day: 0.10    Years: 14.00    Types: Cigarettes, Cigars  . Smokeless tobacco: Never Used     Comment: smoking 1-2 black and milds per day  . Alcohol use No     Comment:  - does smoke tobacco product called blackmilds    Current Outpatient Prescriptions  Medication Sig Dispense Refill  . dicyclomine (BENTYL) 20 MG tablet Take 1 tablet (20 mg total) by mouth 2 (two) times daily. 60 tablet 0  . EDURANT 25 MG TABS tablet TAKE ONE TABLET BY MOUTH EVERY MORNING WITH BREAKFAST 30 tablet 2  . EPIVIR 10 MG/ML solution TAKE 30 MILLILITERS (2 TABLESPOONFULS) BY MOUTH ONCE DAILY 960 mL 2  . norgestimate-ethinyl estradiol (ORTHO-CYCLEN,SPRINTEC,PREVIFEM) 0.25-35 MG-MCG tablet Take 1 tablet by mouth daily. 1 Package 11  . ondansetron (ZOFRAN ODT) 4 MG disintegrating tablet Take 1 tablet (4 mg total) by mouth every 8 (eight) hours as needed for nausea or vomiting. 10 tablet 0  . TIVICAY 50 MG tablet TAKE ONE (1) TABLET BY MOUTH EVERY DAY 30 tablet 2   No current facility-administered medications for this visit.     Allergies  Allergen Reactions  . Ibuprofen Rash and Other (See Comments)    "Irritates my bladder"  . Latex Other (See Comments)    If pt wears latex gloves or any contact with latex causes itching and rash. Some tapes also cause itching, skin  irritation - paper tape seems to be okay  . Sulfamethoxazole-Trimethoprim Other (See Comments)    Rash, chills and hives  . Tylenol [Acetaminophen] Other (See Comments)    Makes me pee more- irritates bladder    Review of Systems No fever no change in weight No shortness of breath no chest pain No syncope or falls  BP 101/71 (BP Location: Right Arm, Patient Position: Sitting, Cuff Size: Large)   Pulse 92   Resp 16   Ht 5\' 1"  (1.549 m)   Wt 171 lb (77.6 kg)   SpO2 99% Comment: RA  BMI 32.31  kg/m  Physical Exam      Exam   Neck without crepitus or tenderness   General- alert and comfortable   Lungs- clear without rales, wheezes   Cor- regular rate and rhythm, no murmur , gallop   Abdomen- soft, non-tender   Extremities - warm, non-tender, minimal edema   Neuro- oriented, appropriate, no focal weakness   Diagnostic Tests: Chest x-ray clear   Impression, plan Healed micro-esophageal tear associated with upper endoscopy. No restrictions on her activity or eating habits. She will return as needed.   Mikey Bussing, MD [facility] [phone]

## 2016-05-30 ENCOUNTER — Other Ambulatory Visit: Payer: Self-pay | Admitting: Internal Medicine

## 2016-06-04 ENCOUNTER — Other Ambulatory Visit: Payer: Self-pay | Admitting: Internal Medicine

## 2016-06-22 ENCOUNTER — Other Ambulatory Visit: Payer: Self-pay | Admitting: Internal Medicine

## 2016-06-22 DIAGNOSIS — B20 Human immunodeficiency virus [HIV] disease: Secondary | ICD-10-CM

## 2016-07-16 ENCOUNTER — Ambulatory Visit: Payer: Medicaid Other | Admitting: Internal Medicine

## 2016-09-25 ENCOUNTER — Ambulatory Visit: Payer: Medicaid Other | Admitting: Internal Medicine

## 2017-07-19 ENCOUNTER — Encounter: Payer: Self-pay | Admitting: Neurology

## 2017-09-24 ENCOUNTER — Ambulatory Visit: Payer: Medicaid Other | Admitting: Neurology

## 2017-09-24 ENCOUNTER — Other Ambulatory Visit: Payer: Self-pay

## 2017-09-24 ENCOUNTER — Encounter: Payer: Self-pay | Admitting: Neurology

## 2017-09-24 VITALS — BP 110/78 | HR 106 | Ht 61.0 in | Wt 139.0 lb

## 2017-09-24 DIAGNOSIS — R404 Transient alteration of awareness: Secondary | ICD-10-CM

## 2017-09-24 NOTE — Patient Instructions (Addendum)
1. Schedule open MRI brain with and without contrast  We have sent a referral for your OPEN MRI to [facility].  They will contact you directly to schedule your appointment.  [facility] is located at [address], [place], [place] [id number].  If you need to speak with [facility] for any reason, they can be reached at [phone]  2. Schedule 1-hour sleep-deprived EEG. If normal, we will do a 48-hour EEG 3. As per [place] driving laws, no driving after an episode of loss of consciousness/awareness, until 6 months event-free 4. Follow-up after tests, call for any changes

## 2017-09-24 NOTE — Progress Notes (Signed)
NEUROLOGY CONSULTATION NOTE  Kara Allen MRN: [id number] DOB: 1980/03/30  Referring provider: Dannielle Karvonen, FNP Primary care provider: Dr. Terisa Starr  Reason for consult:  seizures  Thank you for your kind referral of Kara Allen for consultation of the above symptoms. Although her history is well known to you, please allow me to reiterate it for the purpose of our medical record. The patient was accompanied to the clinic by fiance Berna Spare who also provides collateral information. Records and images were personally reviewed where available.  HISTORY OF PRESENT ILLNESS: This is a pleasant 37 year old right-handed woman with a history of congenital HIV, left hydronephrosis, presenting for evaluation of seizures. She states seizures started at age 37 but she has never seen a neurologist. She recalls having symptoms in high school where she could hear everything tuning out, she could hear her best friend talk but it sounded like she was covering her mouth. She would be unable to respond and start jerking and shaking, having to carry her to the nurses office. When EMS came, her mother told her doctor "she went to church and turned it into a religious thing." She states the seizures depend on what she is going through. Sometimes if she coughs or sneezes, she would go into one. She would feel a jerking inside her chest, like her inside is about to cave in. She tries to fight it and sometimes she can, then starts having a massive headache. She can hear people around her but cannot respond. She would feel depressed and sad after because everyone is looking at her. Berna Spare, her fiance, has known her for 3.5 years and describes the episodes as generally the same, some more intense. If she is in severe pain or sneezes, the would then get quiet and have a blank stare, slowly slide down, unresponsive, making a moaning sound. With more intense ones, she would have upper body jerks lasting a couple  of seconds to up to 5 minutes. She would be disoriented after with a headache, no focal weakness. She had urinary incontinence with one. She has around one episode a month, last episode was a couple of days ago. No nocturnal seizures. She tries to do breathing exercises sometimes which seems to help. She occasionally tastes and smells metal with nausea. She has noticed a lot of short term memory loss. She denies getting lost driving, no missed bills or medications. 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 She mostly has headaches with these episodes, otherwise she has allergy headaches that are relieved with medication. With the bad headaches, she has sensitivity to lights and feels like blinking lights would cause one. No diplopia, dysarthria/dysphagia, neck/back pain, focal numbness/tingling/weakness. She has occasional shooting pain behind her right leg. She was adopted and denies any known family history of seizures. She was born with HIV. She reports a normal birth and early development.  There is no history of febrile convulsions, CNS infections such as meningitis/encephalitis, significant traumatic brain injury, neurosurgical procedures.   PAST MEDICAL HISTORY: Past Medical History:  Diagnosis Date  . Cyst of ovary    PT STATES SHE IS TAKING ANTIBIOTICS FOR CYST ON OVARY  . HIV (human immunodeficiency virus infection) (HCC)   . Hydronephrosis, left   . Pyelonephritis   . Recurrent boils   . Seasonal allergies   . Seizures (HCC) 11/2015   encouraged further eval, not followed  . Trichomonas infection   . Urinary frequency    and nocturia  PAST SURGICAL HISTORY: Past Surgical History:  Procedure Laterality Date  . CESAREAN SECTION  2006  . CYSTOSCOPY WITH RETROGRADE PYELOGRAM, URETEROSCOPY AND STENT PLACEMENT Bilateral 10/28/2013   Procedure: CYSTOSCOPY WITH RETROGRADE PYELOGRAM, CYSTOGRAM;  Surgeon: Valetta Fuller, MD;  Location: [facility];  Service: Urology;  Laterality: Bilateral;  .  LAPAROSCOPIC UNILATERAL SALPINGO OOPHERECTOMY Left 01/05/2014   Procedure: LAPAROSCOPIC LEFT SALPINGO OOPHORECTOMY;  Surgeon: Catalina Antigua, MD;  Location: [facility];  Service: Gynecology;  Laterality: Left;    MEDICATIONS: Current Outpatient Medications on File Prior to Visit  Medication Sig Dispense Refill  . dicyclomine (BENTYL) 20 MG tablet Take 1 tablet (20 mg total) by mouth 2 (two) times daily. 60 tablet 0  . EDURANT 25 MG TABS tablet TAKE ONE TABLET BY MOUTH EVERY MORNING WITH BREAKFAST 30 tablet 5  . EPIVIR 10 MG/ML solution TAKE 30 MILLILITERS (2 TABLESPOONFULS) BY MOUTH ONCE DAILY 960 mL 5  . norgestimate-ethinyl estradiol (ORTHO-CYCLEN,SPRINTEC,PREVIFEM) 0.25-35 MG-MCG tablet Take 1 tablet by mouth daily. 1 Package 11  . ondansetron (ZOFRAN ODT) 4 MG disintegrating tablet Take 1 tablet (4 mg total) by mouth every 8 (eight) hours as needed for nausea or vomiting. 10 tablet 0  . TIVICAY 50 MG tablet TAKE ONE (1) TABLET BY MOUTH EVERY DAY 30 tablet 5   No current facility-administered medications on file prior to visit.     ALLERGIES: Allergies  Allergen Reactions  . Ibuprofen Rash and Other (See Comments)    "Irritates my bladder"  . Latex Other (See Comments)    If pt wears latex gloves or any contact with latex causes itching and rash. Some tapes also cause itching, skin irritation - paper tape seems to be okay  . Sulfamethoxazole-Trimethoprim Other (See Comments)    Rash, chills and hives  . Tylenol [Acetaminophen] Other (See Comments)    Makes me pee more- irritates bladder    FAMILY HISTORY: Family History  Adopted: Yes  Problem Relation Age of Onset  . HIV Mother     SOCIAL HISTORY: Social History   Socioeconomic History  . Marital status: Single    Spouse name: Not on file  . Number of children: 1  . Years of education: Not on file  . Highest education level: Not on file  Occupational History  . Not on file  Social Needs  . Financial resource strain: Not  on file  . Food insecurity:    Worry: Not on file    Inability: Not on file  . Transportation needs:    Medical: Not on file    Non-medical: Not on file  Tobacco Use  . Smoking status: Current Every Day Smoker    Packs/day: 0.10    Years: 14.00    Pack years: 1.40    Types: Cigarettes, Cigars  . Smokeless tobacco: Never Used  . Tobacco comment: smoking 1-2 black and milds per day  Substance and Sexual Activity  . Alcohol use: No    Alcohol/week: 0.0 standard drinks    Comment:  - does smoke tobacco product called blackmilds  . Drug use: Yes    Frequency: 7.0 times per week    Types: Marijuana    Comment: 2-3 joints/daily- 12/30/13- pt advised not to smoke marijuana night before or am of surgery  . Sexual activity: Yes    Partners: Male    Birth control/protection: Condom  Lifestyle  . Physical activity:    Days per week: Not on file    Minutes per session: Not  on file  . Stress: Not on file  Relationships  . Social connections:    Talks on phone: Not on file    Gets together: Not on file    Attends religious service: Not on file    Active member of club or organization: Not on file    Attends meetings of clubs or organizations: Not on file    Relationship status: Not on file  . Intimate partner violence:    Fear of current or ex partner: Not on file    Emotionally abused: Not on file    Physically abused: Not on file    Forced sexual activity: Not on file  Other Topics Concern  . Not on file  Social History Narrative  . Not on file    REVIEW OF SYSTEMS: Constitutional: No fevers, chills, or sweats, no generalized fatigue, change in appetite Eyes: No visual changes, double vision, eye pain Ear, nose and throat: No hearing loss, ear pain, nasal congestion, sore throat Cardiovascular: No chest pain, palpitations Respiratory:  No shortness of breath at rest or with exertion, wheezes GastrointestinaI: No nausea, vomiting, diarrhea, abdominal pain, fecal  incontinence Genitourinary:  No dysuria, urinary retention or frequency Musculoskeletal:  No neck pain, back pain Integumentary: No rash, pruritus, skin lesions Neurological: as above Psychiatric: No depression, insomnia, anxiety Endocrine: No palpitations, fatigue, diaphoresis, mood swings, change in appetite, change in weight, increased thirst Hematologic/Lymphatic:  No anemia, purpura, petechiae. Allergic/Immunologic: no itchy/runny eyes, nasal congestion, recent allergic reactions, rashes  PHYSICAL EXAM: Vitals:   09/24/17 1418  BP: 110/78  Pulse: (!) 106  SpO2: 99%   General: No acute distress Head:  Normocephalic/atraumatic Eyes: Fundoscopic exam shows bilateral sharp discs, no vessel changes, exudates, or hemorrhages Neck: supple, no paraspinal tenderness, full range of motion Back: No paraspinal tenderness Heart: regular rate and rhythm Lungs: Clear to auscultation bilaterally. Vascular: No carotid bruits. Skin/Extremities: No rash, no edema Neurological Exam: Mental status: alert and oriented to person, place, and time, no dysarthria or aphasia, Fund of knowledge is appropriate.  Recent and remote memory are intact. 3/3 delayed recall.  Attention and concentration are normal.    Able to name objects and repeat phrases. Cranial nerves: CN I: not tested CN II: pupils equal, round and reactive to light, visual fields intact, fundi unremarkable. CN III, IV, VI:  full range of motion, no nystagmus, no ptosis CN V: decreased pin on right V1-3 CN VII: upper and lower face symmetric CN VIII: hearing intact to finger rub CN IX, X: gag intact, uvula midline CN XI: sternocleidomastoid and trapezius muscles intact CN XII: tongue midline Bulk & Tone: normal, no fasciculations. Motor: 5/5 throughout with no pronator drift. Sensation: intact to light touch, cold, pin, vibration and joint position sense.  No extinction to double simultaneous stimulation.  Romberg test negative Deep  Tendon Reflexes: +2 throughout, no ankle clonus Plantar responses: downgoing bilaterally Cerebellar: no incoordination on finger to nose, heel to shin. No dysdiadochokinesia Gait: narrow-based and steady, difficulty with tandem walk Tremor: none  IMPRESSION: This is a pleasant 37 year old right-handed woman with a history of HIV, presenting for evaluation of a 20-year history of episodes of staring/unresponsiveness with vocalization, upper body jerks, concerning for focal seizures. She reports never having an evaluation for these, which is unusual. She also reports episodes are triggered by pain, coughing, or sneezing, which is atypical for seizures. MRI brain with and without contrast and a 1-hour sleep-deprived EEG will be ordered to assess for  focal abnormalities that increase risk for recurrent seizures. If normal, a 48-hour EEG will be done to classify these episodes. [place] driving laws were discussed with the patient, and she knows to stop driving after an episode of loss of consciousness/awareness, until 6 months event-free. Follow-up after tests, she knows to call for any changes.   Thank you for allowing me to participate in the care of this patient. Please do not hesitate to call for any questions or concerns.   Patrcia DollyKaren Mariellen Blaney, M.D.  CC: Dannielle KarvonenMelissa Cousins, FNP, Dr. Manson PasseyBrown

## 2017-09-26 ENCOUNTER — Other Ambulatory Visit: Payer: Medicaid Other

## 2017-11-13 ENCOUNTER — Other Ambulatory Visit: Payer: Medicaid Other

## 2018-03-26 IMAGING — NM NM HEPATOBILIARY IMAGE, INC GB
2 series · 11 of 11 positions shown · non-contrast
Comparison: Ultrasound 12/07/2015.

CLINICAL DATA: Right upper quadrant pain.

EXAM:
NUCLEAR MEDICINE HEPATOBILIARY IMAGING
TECHNIQUE: Sequential images of the abdomen were obtained [DATE] minutes
following intravenous administration of radiopharmaceutical.
RADIOPHARMACEUTICALS:  5.2 mCi Ac-55m  Choletec IV

[Series 1: biliary · 4.14mm/px · 6 of 60 frames shown (1 of 2)]
[frame 6/60]
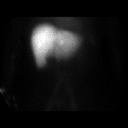
[frame 16/60]
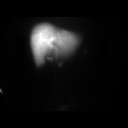
[frame 26/60]
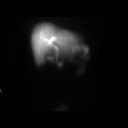
[frame 36/60]
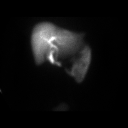
[frame 46/60]
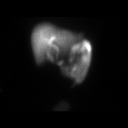
[frame 56/60]
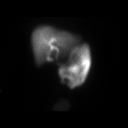

[Series 3: biliary · 4.14mm/px · 5 of 5 frames shown (2 of 2)]
[frame 1/5]
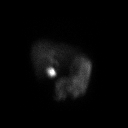
[frame 2/5]
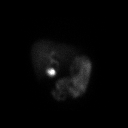
[frame 3/5]
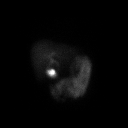
[frame 4/5]
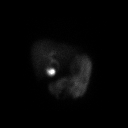
[frame 5/5]
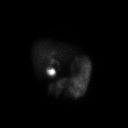

[11 of 11 positions shown; findings below may reference images not displayed]

FINDINGS: Prompt uptake and biliary excretion of activity by the liver is
seen. Gallbladder activity is visualized, consistent with patency of
cystic duct. Biliary activity passes into small bowel, consistent
with patent common bile duct.
IMPRESSION: Normal exam.

## 2021-06-06 ENCOUNTER — Encounter: Payer: Self-pay | Admitting: *Deleted
